# Patient Record
Sex: Male | Born: 1950 | Race: White | Hispanic: No | Marital: Married | Smoking: Never smoker
Health system: Southern US, Community
[De-identification: ages and names within clinical notes are randomized; demographics above are authoritative.]

## PROBLEM LIST (undated history)

## (undated) DIAGNOSIS — M1990 Unspecified osteoarthritis, unspecified site: Secondary | ICD-10-CM

## (undated) DIAGNOSIS — I480 Paroxysmal atrial fibrillation: Secondary | ICD-10-CM

## (undated) DIAGNOSIS — I2510 Atherosclerotic heart disease of native coronary artery without angina pectoris: Secondary | ICD-10-CM

## (undated) DIAGNOSIS — E785 Hyperlipidemia, unspecified: Secondary | ICD-10-CM

## (undated) DIAGNOSIS — E669 Obesity, unspecified: Secondary | ICD-10-CM

## (undated) DIAGNOSIS — E119 Type 2 diabetes mellitus without complications: Secondary | ICD-10-CM

## (undated) DIAGNOSIS — G4733 Obstructive sleep apnea (adult) (pediatric): Secondary | ICD-10-CM

## (undated) DIAGNOSIS — Z8249 Family history of ischemic heart disease and other diseases of the circulatory system: Secondary | ICD-10-CM

## (undated) DIAGNOSIS — I10 Essential (primary) hypertension: Secondary | ICD-10-CM

## (undated) HISTORY — DX: Atherosclerotic heart disease of native coronary artery without angina pectoris: I25.10

## (undated) HISTORY — DX: Family history of ischemic heart disease and other diseases of the circulatory system: Z82.49

## (undated) HISTORY — DX: Obstructive sleep apnea (adult) (pediatric): G47.33

## (undated) HISTORY — DX: Unspecified osteoarthritis, unspecified site: M19.90

## (undated) HISTORY — DX: Obesity, unspecified: E66.9

## (undated) HISTORY — DX: Type 2 diabetes mellitus without complications: E11.9

## (undated) HISTORY — PX: APPENDECTOMY: SHX54

## (undated) HISTORY — DX: Hyperlipidemia, unspecified: E78.5

## (undated) HISTORY — DX: Essential (primary) hypertension: I10

## (undated) HISTORY — DX: Paroxysmal atrial fibrillation: I48.0

---

## 2014-09-20 ENCOUNTER — Encounter: Payer: Self-pay | Admitting: Cardiovascular Disease

## 2014-09-20 ENCOUNTER — Ambulatory Visit (INDEPENDENT_AMBULATORY_CARE_PROVIDER_SITE_OTHER): Payer: 59 | Admitting: Cardiovascular Disease

## 2014-09-20 ENCOUNTER — Encounter (INDEPENDENT_AMBULATORY_CARE_PROVIDER_SITE_OTHER): Payer: 59

## 2014-09-20 VITALS — BP 146/82 | HR 63 | Ht 71.0 in | Wt 272.0 lb

## 2014-09-20 DIAGNOSIS — R079 Chest pain, unspecified: Secondary | ICD-10-CM | POA: Diagnosis not present

## 2014-09-20 DIAGNOSIS — R002 Palpitations: Secondary | ICD-10-CM | POA: Diagnosis not present

## 2014-09-20 DIAGNOSIS — E785 Hyperlipidemia, unspecified: Secondary | ICD-10-CM | POA: Diagnosis not present

## 2014-09-20 DIAGNOSIS — I10 Essential (primary) hypertension: Secondary | ICD-10-CM | POA: Diagnosis not present

## 2014-09-20 DIAGNOSIS — G4733 Obstructive sleep apnea (adult) (pediatric): Secondary | ICD-10-CM | POA: Diagnosis not present

## 2014-09-20 DIAGNOSIS — E119 Type 2 diabetes mellitus without complications: Secondary | ICD-10-CM | POA: Insufficient documentation

## 2014-09-20 NOTE — Assessment & Plan Note (Signed)
istory of hypertension blood pressure measured at 146/82. He is on carvedilol, lisinopril and hydrochlorothiazide. Continue current meds at current dosing

## 2014-09-20 NOTE — Progress Notes (Signed)
09/20/2014 Dennis King   [birth year]  [id number]  Primary Physician FREED, Helyn AppANIEL M, MD Primary Cardiologist: Runell GessJonathan J. Thanos Cousineau MD Roseanne RenoFACP,FACC,FAHA, FSCAI   HPI:  Dennis King is a 64 year old married Caucasian male father of 2 children who is a retired International aid/development workerveterinarian. His primary care physician is Dr. Marinda Elkobert Fried in [place]. He was referred for evaluation of palpitations and chest pain. His cardiac risk factor profile is notable for treated hypertension, diabetes and hyperlipidemia. He does have a family history of heart disease with a father who had his first MI at age 63 and died 10 years later from this. His mother had atrial fibrillation. He does have obstructive sleep apnea C Pap. He noted palpitations last summer which has been occurring on a monthly basis is associated with chest pressure and relative hypotension. He does drink 4 glasses of wine at night and several glasses of whiskey a week.   Current Outpatient Prescriptions  Medication Sig Dispense Refill  . atorvastatin (LIPITOR) 20 MG tablet Take 20 mg by mouth daily at 6 PM.     . carvedilol (COREG) 12.5 MG tablet Take 12.5 mg by mouth 2 (two) times daily with a meal.     . HUMALOG MIX 75/25 (75-25) 100 UNIT/ML SUSP injection Inject 15-40 Units into the skin 2 (two) times daily with a meal.     . JARDIANCE 10 MG TABS tablet 10 mg daily.     Marland Kitchen. lisinopril-hydrochlorothiazide (PRINZIDE,ZESTORETIC) 20-25 MG per tablet Take 1 tablet by mouth daily.     . metFORMIN (GLUCOPHAGE) 1000 MG tablet Take 2,000 mg by mouth daily with breakfast.     . NON FORMULARY Cpap 8cm H2O     No current facility-administered medications for this visit.    No Known Allergies  History   Social History  . Marital Status: Married    Spouse Name: N/A  . Number of Children: N/A  . Years of Education: N/A   Occupational History  . Not on file.   Social History Main Topics  . Smoking status: Never Smoker   . Smokeless tobacco: Not on file    . Alcohol Use: Not on file  . Drug Use: Not on file  . Sexual Activity: Not on file   Other Topics Concern  . Not on file   Social History Narrative  . No narrative on file     Review of Systems: General: negative for chills, fever, night sweats or weight changes.  Cardiovascular: negative for chest pain, dyspnea on exertion, edema, orthopnea, palpitations, paroxysmal nocturnal dyspnea or shortness of breath Dermatological: negative for rash Respiratory: negative for cough or wheezing Urologic: negative for hematuria Abdominal: negative for nausea, vomiting, diarrhea, bright red blood per rectum, melena, or hematemesis Neurologic: negative for visual changes, syncope, or dizziness All other systems reviewed and are otherwise negative except as noted above.    Blood pressure 146/82, pulse 63, height 5\' 11"  (1.803 m), weight 272 lb (123.378 kg).  General appearance: alert and no distress Neck: no adenopathy, no carotid bruit, no JVD, supple, symmetrical, trachea midline and thyroid not enlarged, symmetric, no tenderness/mass/nodules Lungs: clear to auscultation bilaterally Heart: regular rate and rhythm, S1, S2 normal, no murmur, click, rub or gallop Extremities: extremities normal, atraumatic, no cyanosis or edema and 2+ pedal pulses bilaterally  EKG normal sinus rhythm at 63 without ST or T-wave changes. I personally reviewed this EKG  ASSESSMENT AND PLAN:   Palpitations Patient complains of palpitations that occurred since  last summer several times a month with heart rates up to 150 associated with chest pressure and hypotension. I suspect this is PAF. If so he would need to be on oral anticoagulant. I'm going to get an event monitor to document this.   Obstructive sleep apnea On C Pap which he benefits from   Hyperlipidemia History of hyperlipidemia on atorvastatin 20 mg a day followed by his PCP   Essential hypertension istory of hypertension blood pressure  measured at 146/82. He is on carvedilol, lisinopril and hydrochlorothiazide. Continue current meds at current dosing       Runell Gess MD [facility], [facility], [place] 09/20/2014 5:12 PM

## 2014-09-20 NOTE — Assessment & Plan Note (Signed)
Patient complains of palpitations that occurred since last summer several times a month with heart rates up to 150 associated with chest pressure and hypotension. I suspect this is PAF. If so he would need to be on oral anticoagulant. I'm going to get an event monitor to document this.

## 2014-09-20 NOTE — Patient Instructions (Signed)
We will see you back in follow up after the tests.  Dr Allyson SabalBerry has ordered: 1.  Echocardiogram. Echocardiography is a painless test that uses sound waves to create images of your heart. It provides your doctor with information about the size and shape of your heart and how well your heart's chambers and valves are working. This procedure takes approximately one hour. There are no restrictions for this procedure.   2. Exercise Myoview- this is a test that looks at the blood flow to your heart muscle.  It takes approximately 2 1/2 hours. Please follow instruction sheet, as given.  3. Your Doctor has ordered you to wear a heart monitor. You will wear this for 30  days.   TIPS -  REMINDERS 1. The sensor is the lanyard that is worn around your neck every day - this is powered by a battery that needs to be changed every day 2. The monitor is the device that allows you to record symptoms - this will need to be charged daily 3. The sensor & monitor need to be within 100 feet of each other at all times 4. The sensor connects to the electrodes (stickers) - these should be changed every 24-48 hours (you do not have to remove them when you bathe, just make sure they are dry when you connect it back to the sensor 5. If you need more supplies (electrodes, batteries), please call the 1-800 # on the back of the pamphlet and [employer] will mail you more supplies 6. If your skin becomes sensitive, please try the sample pack of sensitive skin electrodes (the white packet in your silver box) and call [employer] to have them mail you more of these type of electrodes 7. When you are finish wearing the monitor, please place all supplies back in the silver box, place the silver box in the pre-packaged UPS bag and drop off at [employer] or call them so they can come pick it up   Cardiac Event Monitoring A cardiac event monitor is a small recording device used to help detect abnormal heart rhythms (arrhythmias). The monitor is  used to record heart rhythm when noticeable symptoms such as the following occur:  Fast heartbeats (palpitations), such as heart racing or fluttering.  Dizziness.  Fainting or light-headedness.  Unexplained weakness. The monitor is wired to two electrodes placed on your chest. Electrodes are flat, sticky disks that attach to your skin. The monitor can be worn for up to 30 days. You will wear the monitor at all times, except when bathing.  HOW TO USE YOUR CARDIAC EVENT MONITOR A technician will prepare your chest for the electrode placement. The technician will show you how to place the electrodes, how to work the monitor, and how to replace the batteries. Take time to practice using the monitor before you leave the office. Make sure you understand how to send the information from the monitor to your health care provider. This requires a telephone with a landline, not a cell phone. You need to:  Wear your monitor at all times, except when you are in water:  Do not get the monitor wet.  Take the monitor off when bathing. Do not swim or use a hot tub with it on.  Keep your skin clean. Do not put body lotion or moisturizer on your chest.  Change the electrodes daily or any time they stop sticking to your skin. You might need to use tape to keep them on.  It is possible  that your skin under the electrodes could become irritated. To keep this from happening, try to put the electrodes in slightly different places on your chest. However, they must remain in the area under your left breast and in the upper right section of your chest.  Make sure the monitor is safely clipped to your clothing or in a location close to your body that your health care provider recommends.  Press the button to record when you feel symptoms of heart trouble, such as dizziness, weakness, light-headedness, palpitations, thumping, shortness of breath, unexplained weakness, or a fluttering or racing heart. The monitor is  always on and records what happened slightly before you pressed the button, so do not worry about being too late to get good information.  Keep a diary of your activities, such as walking, doing chores, and taking medicine. It is especially important to note what you were doing when you pushed the button to record your symptoms. This will help your health care provider determine what might be contributing to your symptoms. The information stored in your monitor will be reviewed by your health care provider alongside your diary entries.  Send the recorded information as recommended by your health care provider. It is important to understand that it will take some time for your health care provider to process the results.  Change the batteries as recommended by your health care provider. SEEK IMMEDIATE MEDICAL CARE IF:   You have chest pain.  You have extreme difficulty breathing or shortness of breath.  You develop a very fast heartbeat that persists.  You develop dizziness that does not go away.  You faint or constantly feel you are about to faint. Document Released: 02/17/2008 Document Revised: 09/24/2013 Document Reviewed: 11/06/2012 [employer] Patient Information 2015 [place], [place]. This information is not intended to replace advice given to you by your health care provider. Make sure you discuss any questions you have with your health care provider.

## 2014-09-20 NOTE — Assessment & Plan Note (Signed)
History of hyperlipidemia on atorvastatin 20 mg a day followed by his PCP 

## 2014-09-20 NOTE — Assessment & Plan Note (Signed)
On C Pap which he benefits from 

## 2014-09-23 ENCOUNTER — Telehealth: Payer: Self-pay | Admitting: Cardiovascular Disease

## 2014-09-23 NOTE — Telephone Encounter (Signed)
[employer] called - reported autotrigger event which met high heart rate criteria.  HR 185 - A Fib on monitor.  No symptoms reported by patient.   Fax number confirmed, awaiting strips. 

## 2014-09-23 NOTE — Telephone Encounter (Signed)
[employer] reporting:  A fib - 175 bpm autotrigger. No symptoms recorded.

## 2014-09-23 NOTE — Telephone Encounter (Signed)
She has an abnormal EKG to report.

## 2014-09-23 NOTE — Telephone Encounter (Signed)
Discussed w/ Dr. Tresa EndoKelly. Plan is to increase carvedilol to 25mg  BID, start patient on oral anticoagulant - Xarelto or Eliquis - Dr. Tresa EndoKelly [facility]  Will route to [place] to advise.

## 2014-09-23 NOTE — Telephone Encounter (Signed)
[employer] called. Pt still in A fib - 168 HR last checked ~10:30am. No symptoms reported.  Advised awaiting first fax, verified fax number.   Called patient to follow up, he states no problems, having no symptoms.   Pt reports he took VS this AM - BP 123/82. HR was 73 then. 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   Informed patient I would alert [employer] for [employer] report, call pt back if any recommendations. Pt amenable to plan.

## 2014-09-24 ENCOUNTER — Telehealth: Payer: Self-pay | Admitting: Cardiovascular Disease

## 2014-09-24 DIAGNOSIS — Z79899 Other long term (current) drug therapy: Secondary | ICD-10-CM

## 2014-09-24 MED ORDER — APIXABAN 5 MG PO TABS
5.0000 mg | ORAL_TABLET | Freq: Two times a day (BID) | ORAL | Status: DC
Start: 2014-09-24 — End: 2014-10-23

## 2014-09-24 NOTE — Telephone Encounter (Signed)
Dr Jens Somrenshaw reviewed the report.  Voice order received from Dr Jens Somrenshaw: d/c ASA, start apixaban 5mg  bid, cbc and bmp in 4 weeks.   I spoke with patient and he verbalized understanding of instructions.  He will d/c ASA, start apixaban (ERX sent) and have labs drawn in 1 month (lap slip mailed).   I contact PCP and requested previous labs.

## 2014-09-24 NOTE — Telephone Encounter (Signed)
Spoke to CG ([employer] REP.) AUTO TRIGGER EVENT AT 5:30 AM TODAY  RAPID AFIB. RATE 168 REPORT BEING FAXED FOR REVIEW

## 2014-09-25 ENCOUNTER — Telehealth: Payer: Self-pay | Admitting: *Deleted

## 2014-09-25 NOTE — Telephone Encounter (Signed)
Siji, [employer] rep reporting autotrigger HR of 183 - showing A fib. Confirmed fax number.  Pt history of palpitations x1 yr. Recently seen by Dr. Allyson SabalBerry to establish care. Was put on 30-day event monitor. Pt has had several episodes of A Fib w RVR since having monitor placed - see recent notes  per Kathryn's note yesterday, pt started on Eliquis w/ instructions for f/u labwork.  Will route to [employer] for any additional advice/instruction.

## 2014-09-25 NOTE — Telephone Encounter (Signed)
Pt.s most recent  Rhythm strip reviewed by Dr. [place]az[place] no new orders

## 2014-09-25 NOTE — Telephone Encounter (Signed)
Siji has an urgent EKG to report for the pt. Please f/u with her  Thanks

## 2014-09-25 NOTE — Telephone Encounter (Signed)
Spoke with patient, he was already prescribed Eliquis 5 mg bid.  Reviewed medication information with him.  He is still waiting to see if will be covered by his insurance.  Will mail a copay card to him, as he is commercial not medicare.  Pt knows to call if problems obtaining med.  Pt takes aleve 2 tabs bid.  Was told that this would interact.  Uses for arthritis and back pain.  Asked that he decrease dose to 1 tab twice daily and be sure to take with food.  Gave information about GI bleed, pt voiced understanding and knowledge about this interaction.

## 2014-09-25 NOTE — Telephone Encounter (Signed)
[facility]  Cr 1.26, CrCl  64.7 using IBW.  Will start patient on Eliquis 5 mg bid.

## 2014-09-25 NOTE — Telephone Encounter (Signed)
Autotrigger alert - HR 177. No symptoms reported by patient.

## 2014-09-25 NOTE — Telephone Encounter (Signed)
Should take extra carvedilol when HR elevated and feeling palpitations.  >Dr H

## 2014-09-25 NOTE — Telephone Encounter (Signed)
Spoke w/ patient - per Dr. Blanchie DessertHilty's advice, suggested pt take extra dose of carvedilol for symptoms. Requested if he can keep track of baseline HR to do so. He reports when at rest, HR is 70-mid 80s.  Pt voiced understanding of instructions given.

## 2014-09-25 NOTE — Telephone Encounter (Signed)
[employer] has called for an auto trigger event of Atrial Fibb with a rate of 177 bpm, I will show this to Dr. Rennis GoldenHilty as it comes in on hard copy for his review

## 2014-09-27 ENCOUNTER — Encounter: Payer: Self-pay | Admitting: Cardiology

## 2014-10-08 ENCOUNTER — Telehealth (HOSPITAL_COMMUNITY): Payer: Self-pay | Admitting: *Deleted

## 2014-10-08 NOTE — Telephone Encounter (Signed)
Patient given detailed instructions per Myocardial Perfusion Study Information Sheet for test on 10/10/14 at 7:15 Patient verbalized understanding. Antionette CharMary J Myrla Malanowski, RN

## 2014-10-09 ENCOUNTER — Telehealth: Payer: Self-pay | Admitting: Cardiovascular Disease

## 2014-10-09 NOTE — Telephone Encounter (Signed)
Dr Allyson SabalBerry signed off on the report. No med changes at this time.

## 2014-10-09 NOTE — Telephone Encounter (Signed)
[facility] alert  A Fib, High HR alert. Lasted ~2 mins. Peak 177bpm, autotrigger, no symptoms recorded.   They will send strip. Verified fax number for office.

## 2014-10-10 ENCOUNTER — Ambulatory Visit (HOSPITAL_COMMUNITY)
Admission: RE | Admit: 2014-10-10 | Discharge: 2014-10-10 | Disposition: A | Discharge status: Home / Self Care | Payer: 59 | Source: Ambulatory Visit | Attending: Cardiovascular Disease | Admitting: Cardiovascular Disease

## 2014-10-10 ENCOUNTER — Ambulatory Visit (HOSPITAL_BASED_OUTPATIENT_CLINIC_OR_DEPARTMENT_OTHER)
Admission: RE | Admit: 2014-10-10 | Discharge: 2014-10-10 | Disposition: A | Discharge status: Home / Self Care | Payer: 59 | Source: Ambulatory Visit | Attending: Cardiovascular Disease | Admitting: Cardiovascular Disease

## 2014-10-10 DIAGNOSIS — E785 Hyperlipidemia, unspecified: Secondary | ICD-10-CM | POA: Insufficient documentation

## 2014-10-10 DIAGNOSIS — G4733 Obstructive sleep apnea (adult) (pediatric): Secondary | ICD-10-CM | POA: Diagnosis not present

## 2014-10-10 DIAGNOSIS — I10 Essential (primary) hypertension: Secondary | ICD-10-CM | POA: Diagnosis not present

## 2014-10-10 DIAGNOSIS — E119 Type 2 diabetes mellitus without complications: Secondary | ICD-10-CM | POA: Insufficient documentation

## 2014-10-10 DIAGNOSIS — R079 Chest pain, unspecified: Secondary | ICD-10-CM

## 2014-10-10 DIAGNOSIS — Z8249 Family history of ischemic heart disease and other diseases of the circulatory system: Secondary | ICD-10-CM | POA: Diagnosis not present

## 2014-10-10 DIAGNOSIS — R002 Palpitations: Secondary | ICD-10-CM | POA: Insufficient documentation

## 2014-10-10 LAB — MYOCARDIAL PERFUSION IMAGING
CHL CUP NUCLEAR SDS: 3
CHL CUP NUCLEAR SSS: 3
CHL CUP STRESS STAGE 1 DBP: 79 mmHg
CHL CUP STRESS STAGE 1 GRADE: 0 %
CHL CUP STRESS STAGE 1 SPEED: 0 mph
CHL CUP STRESS STAGE 2 GRADE: 0 %
CHL CUP STRESS STAGE 2 HR: 137 {beats}/min
CHL CUP STRESS STAGE 2 SPEED: 1 mph
CHL CUP STRESS STAGE 4 HR: 184 {beats}/min
CHL CUP STRESS STAGE 4 SPEED: 1.7 mph
CHL CUP STRESS STAGE 5 HR: 214 {beats}/min
CHL CUP STRESS STAGE 5 SBP: 141 mmHg
CHL CUP STRESS STAGE 6 GRADE: 12.1 %
CHL CUP STRESS STAGE 6 HR: 210 {beats}/min
CHL CUP STRESS STAGE 7 DBP: 64 mmHg
CHL CUP STRESS STAGE 8 HR: 133 {beats}/min
CHL CUP STRESS STAGE 8 SPEED: 0 mph
CSEPEDS: 0 s
CSEPPHR: 210 {beats}/min
CSEPPMHR: 132 %
Estimated workload: 7 METS
Exercise duration (min): 6 min
LV dias vol: 74 mL
LVSYSVOL: 49 mL
MPHR: 158 {beats}/min
Nuc Stress EF: 34 %
Percent HR: 140 %
RPE: 30174
Rest HR: 101 {beats}/min
SRS: 0
Stage 1 HR: 139 {beats}/min
Stage 1 SBP: 102 mmHg
Stage 3 Grade: 0 %
Stage 3 HR: 139 {beats}/min
Stage 3 Speed: 1 mph
Stage 4 DBP: 62 mmHg
Stage 4 Grade: 10 %
Stage 4 SBP: 133 mmHg
Stage 5 DBP: 64 mmHg
Stage 5 Grade: 12 %
Stage 5 Speed: 2.5 mph
Stage 6 Speed: 2.5 mph
Stage 7 Grade: 0 %
Stage 7 HR: 184 {beats}/min
Stage 7 SBP: 125 mmHg
Stage 7 Speed: 0 mph
Stage 8 DBP: 68 mmHg
Stage 8 Grade: 0 %
Stage 8 SBP: 110 mmHg
TID: 0.74

## 2014-10-10 MED ORDER — TECHNETIUM TC 99M SESTAMIBI GENERIC - CARDIOLITE
30.5000 | Freq: Once | INTRAVENOUS | Status: AC | PRN
  Administered 2014-10-10: 31 via INTRAVENOUS

## 2014-10-10 MED ORDER — TECHNETIUM TC 99M SESTAMIBI GENERIC - CARDIOLITE
10.9000 | Freq: Once | INTRAVENOUS | Status: AC | PRN
  Administered 2014-10-10: 10.9 via INTRAVENOUS

## 2014-10-10 NOTE — Progress Notes (Signed)
Mr. Dennis King was very tachycardic during the echo procedure, making it difficult to obtain images for evaluation of systolic and diastolic function.  Parasternal short axis Images are adequate for EF evaluation, although it is not accurate due to the heart rate and rhythm.  Mr. Dennis King decided against use of definity contrast at this time, until his heart rate is more under control, and an accurate measurement of EF could be evaluated.  He is happy to return back for a limited evaluation of EF with contrast when his rhythm is controlled.  Farrel ConnersBethany Macel Yearsley, RDCS

## 2014-10-11 ENCOUNTER — Telehealth: Payer: Self-pay | Admitting: Cardiovascular Disease

## 2014-10-11 NOTE — Telephone Encounter (Signed)
Closed encounter °

## 2014-10-14 ENCOUNTER — Telehealth: Payer: Self-pay | Admitting: Cardiovascular Disease

## 2014-10-14 NOTE — Telephone Encounter (Signed)
Pt called in wanting to know if there was anything significant that was found on his stress test that he should be aware of. He had to r/s his appt with Dr. Allyson SabalBerry for July because he was going out of town tomorrow. Please f/u with pt  Thanks

## 2014-10-14 NOTE — Telephone Encounter (Signed)
Left message on machine for patient to return call

## 2014-10-15 ENCOUNTER — Ambulatory Visit: Payer: Self-pay | Admitting: Cardiovascular Disease

## 2014-10-15 NOTE — Telephone Encounter (Signed)
Spoke to patient - advised Dr. Allyson SabalBerry would be able to discuss in further detail results of stress test - pt wanted to make me aware stress test was not stopped early on his account, but because nurse was concerned about his heart rate elevation. States he did not feel fatigued during test, but was aware his HR was fast.  We also discussed his continuing to wear the 30 day event monitor. No further questions from patient.

## 2014-10-16 ENCOUNTER — Ambulatory Visit: Payer: Self-pay | Admitting: Cardiovascular Disease

## 2014-10-20 ENCOUNTER — Encounter (HOSPITAL_COMMUNITY): Payer: Self-pay | Admitting: *Deleted

## 2014-10-20 ENCOUNTER — Inpatient Hospital Stay (HOSPITAL_COMMUNITY)
Admission: AD | Admit: 2014-10-20 | Discharge: 2014-10-23 | DRG: 251 | Disposition: A | Discharge status: Home / Self Care | Payer: 59 | Source: Ambulatory Visit | Attending: Cardiology | Admitting: Cardiology

## 2014-10-20 DIAGNOSIS — I214 Non-ST elevation (NSTEMI) myocardial infarction: Secondary | ICD-10-CM | POA: Diagnosis not present

## 2014-10-20 DIAGNOSIS — E669 Obesity, unspecified: Secondary | ICD-10-CM | POA: Diagnosis present

## 2014-10-20 DIAGNOSIS — I2510 Atherosclerotic heart disease of native coronary artery without angina pectoris: Secondary | ICD-10-CM | POA: Diagnosis not present

## 2014-10-20 DIAGNOSIS — Z79899 Other long term (current) drug therapy: Secondary | ICD-10-CM

## 2014-10-20 DIAGNOSIS — I480 Paroxysmal atrial fibrillation: Secondary | ICD-10-CM | POA: Diagnosis not present

## 2014-10-20 DIAGNOSIS — I420 Dilated cardiomyopathy: Secondary | ICD-10-CM | POA: Diagnosis present

## 2014-10-20 DIAGNOSIS — E876 Hypokalemia: Secondary | ICD-10-CM | POA: Diagnosis not present

## 2014-10-20 DIAGNOSIS — Z6835 Body mass index (BMI) 35.0-35.9, adult: Secondary | ICD-10-CM | POA: Diagnosis not present

## 2014-10-20 DIAGNOSIS — G4733 Obstructive sleep apnea (adult) (pediatric): Secondary | ICD-10-CM | POA: Diagnosis present

## 2014-10-20 DIAGNOSIS — I4891 Unspecified atrial fibrillation: Secondary | ICD-10-CM

## 2014-10-20 DIAGNOSIS — R7989 Other specified abnormal findings of blood chemistry: Secondary | ICD-10-CM | POA: Diagnosis not present

## 2014-10-20 DIAGNOSIS — E785 Hyperlipidemia, unspecified: Secondary | ICD-10-CM | POA: Diagnosis present

## 2014-10-20 DIAGNOSIS — N189 Chronic kidney disease, unspecified: Secondary | ICD-10-CM | POA: Diagnosis present

## 2014-10-20 DIAGNOSIS — I129 Hypertensive chronic kidney disease with stage 1 through stage 4 chronic kidney disease, or unspecified chronic kidney disease: Secondary | ICD-10-CM | POA: Diagnosis present

## 2014-10-20 DIAGNOSIS — R079 Chest pain, unspecified: Secondary | ICD-10-CM

## 2014-10-20 DIAGNOSIS — Z7901 Long term (current) use of anticoagulants: Secondary | ICD-10-CM | POA: Diagnosis not present

## 2014-10-20 DIAGNOSIS — Z8249 Family history of ischemic heart disease and other diseases of the circulatory system: Secondary | ICD-10-CM | POA: Diagnosis not present

## 2014-10-20 DIAGNOSIS — I10 Essential (primary) hypertension: Secondary | ICD-10-CM | POA: Diagnosis present

## 2014-10-20 DIAGNOSIS — Z9861 Coronary angioplasty status: Secondary | ICD-10-CM

## 2014-10-20 DIAGNOSIS — R778 Other specified abnormalities of plasma proteins: Secondary | ICD-10-CM | POA: Insufficient documentation

## 2014-10-20 DIAGNOSIS — E119 Type 2 diabetes mellitus without complications: Secondary | ICD-10-CM | POA: Diagnosis not present

## 2014-10-20 DIAGNOSIS — Z794 Long term (current) use of insulin: Secondary | ICD-10-CM | POA: Diagnosis not present

## 2014-10-20 LAB — GLUCOSE, CAPILLARY
GLUCOSE-CAPILLARY: 119 mg/dL — AB (ref 65–99)
Glucose-Capillary: 185 mg/dL — ABNORMAL HIGH (ref 65–99)
Glucose-Capillary: 126 mg/dL — ABNORMAL HIGH (ref 65–99)
Glucose-Capillary: 177 mg/dL — ABNORMAL HIGH (ref 65–99)

## 2014-10-20 LAB — TROPONIN I
TROPONIN I: 0.19 ng/mL — AB (ref <0.031)
TROPONIN I: 0.35 ng/mL — AB (ref <0.031)
Troponin I: 0.61 ng/mL (ref <0.031)

## 2014-10-20 LAB — MAGNESIUM: MAGNESIUM: 2.2 mg/dL (ref 1.7–2.4)

## 2014-10-20 LAB — TSH: TSH: 2.763 u[IU]/mL (ref 0.350–4.500)

## 2014-10-20 MED ORDER — SODIUM CHLORIDE 0.9 % IJ SOLN: 3.0000 mL | INTRAMUSCULAR | Status: DC | PRN

## 2014-10-20 MED ORDER — OFF THE BEAT BOOK
Freq: Once | Status: AC
  Administered 2014-10-20
  Filled 2014-10-20: qty 1

## 2014-10-20 MED ORDER — LISINOPRIL 10 MG PO TABS
20.0000 mg | ORAL_TABLET | Freq: Every day | ORAL | Status: DC
  Administered 2014-10-20 – 2014-10-23 (×4): 20 mg via ORAL
  Filled 2014-10-20: qty 1
  Filled 2014-10-20: qty 2
  Filled 2014-10-20 (×2): qty 1

## 2014-10-20 MED ORDER — EMPAGLIFLOZIN 10 MG PO TABS
10.0000 mg | ORAL_TABLET | Freq: Every day | ORAL | Status: DC
  Administered 2014-10-21: 10 mg via ORAL

## 2014-10-20 MED ORDER — HEPARIN BOLUS VIA INFUSION
4000.0000 [IU] | Freq: Once | INTRAVENOUS | Status: AC
  Administered 2014-10-20: 4000 [IU] via INTRAVENOUS
  Filled 2014-10-20: qty 4000

## 2014-10-20 MED ORDER — ASPIRIN EC 81 MG PO TBEC
81.0000 mg | DELAYED_RELEASE_TABLET | Freq: Every day | ORAL | Status: DC
  Administered 2014-10-20 – 2014-10-21 (×2): 81 mg via ORAL
  Filled 2014-10-20 (×2): qty 1

## 2014-10-20 MED ORDER — INSULIN ASPART PROT & ASPART (70-30 MIX) 100 UNIT/ML ~~LOC~~ SUSP
15.0000 [IU] | Freq: Two times a day (BID) | SUBCUTANEOUS | Status: DC
  Administered 2014-10-20 – 2014-10-23 (×6): 15 [IU] via SUBCUTANEOUS
  Filled 2014-10-20 (×2): qty 10

## 2014-10-20 MED ORDER — LISINOPRIL-HYDROCHLOROTHIAZIDE 20-25 MG PO TABS: 1.0000 | ORAL_TABLET | Freq: Every day | ORAL | Status: DC

## 2014-10-20 MED ORDER — FUROSEMIDE 10 MG/ML IJ SOLN
40.0000 mg | Freq: Once | INTRAMUSCULAR | Status: AC
  Administered 2014-10-21: 40 mg via INTRAVENOUS
  Filled 2014-10-20: qty 4

## 2014-10-20 MED ORDER — ONDANSETRON HCL 4 MG/2ML IJ SOLN: 4.0000 mg | Freq: Four times a day (QID) | INTRAMUSCULAR | Status: DC | PRN

## 2014-10-20 MED ORDER — SALINE SPRAY 0.65 % NA SOLN
1.0000 | NASAL | Status: DC | PRN
  Administered 2014-10-20: 1 via NASAL
  Filled 2014-10-20: qty 44

## 2014-10-20 MED ORDER — CARVEDILOL 12.5 MG PO TABS: 12.5000 mg | ORAL_TABLET | Freq: Two times a day (BID) | ORAL | Status: DC

## 2014-10-20 MED ORDER — HYDROCHLOROTHIAZIDE 25 MG PO TABS
25.0000 mg | ORAL_TABLET | Freq: Every day | ORAL | Status: DC
  Administered 2014-10-20: 25 mg via ORAL
  Filled 2014-10-20 (×2): qty 1

## 2014-10-20 MED ORDER — TICAGRELOR 90 MG PO TABS: 90.0000 mg | ORAL_TABLET | Freq: Two times a day (BID) | ORAL | Status: DC

## 2014-10-20 MED ORDER — SODIUM CHLORIDE 0.9 % IV SOLN: 250.0000 mL | INTRAVENOUS | Status: DC | PRN

## 2014-10-20 MED ORDER — SODIUM CHLORIDE 0.9 % IJ SOLN
3.0000 mL | Freq: Two times a day (BID) | INTRAMUSCULAR | Status: DC
  Administered 2014-10-20: 3 mL via INTRAVENOUS

## 2014-10-20 MED ORDER — TICAGRELOR 90 MG PO TABS
180.0000 mg | ORAL_TABLET | Freq: Once | ORAL | Status: AC
  Administered 2014-10-20: 180 mg via ORAL
  Filled 2014-10-20: qty 2

## 2014-10-20 MED ORDER — ACETAMINOPHEN 325 MG PO TABS: 650.0000 mg | ORAL_TABLET | ORAL | Status: DC | PRN

## 2014-10-20 MED ORDER — INSULIN ASPART 100 UNIT/ML ~~LOC~~ SOLN
0.0000 [IU] | Freq: Three times a day (TID) | SUBCUTANEOUS | Status: DC
  Administered 2014-10-20 (×2): 2 [IU] via SUBCUTANEOUS
  Administered 2014-10-20 – 2014-10-21 (×3): 3 [IU] via SUBCUTANEOUS
  Administered 2014-10-21: 2 [IU] via SUBCUTANEOUS
  Administered 2014-10-22 (×2): 3 [IU] via SUBCUTANEOUS
  Administered 2014-10-22: 2 [IU] via SUBCUTANEOUS
  Administered 2014-10-23: 3 [IU] via SUBCUTANEOUS

## 2014-10-20 MED ORDER — CARVEDILOL 12.5 MG PO TABS
12.5000 mg | ORAL_TABLET | Freq: Every day | ORAL | Status: DC
  Administered 2014-10-20 – 2014-10-22 (×3): 12.5 mg via ORAL
  Filled 2014-10-20 (×3): qty 1

## 2014-10-20 MED ORDER — ROSUVASTATIN CALCIUM 20 MG PO TABS
20.0000 mg | ORAL_TABLET | Freq: Every day | ORAL | Status: DC
  Administered 2014-10-20 – 2014-10-22 (×3): 20 mg via ORAL
  Filled 2014-10-20: qty 2
  Filled 2014-10-20: qty 1
  Filled 2014-10-20 (×2): qty 2

## 2014-10-20 MED ORDER — CARVEDILOL 12.5 MG PO TABS
25.0000 mg | ORAL_TABLET | Freq: Every day | ORAL | Status: DC
  Administered 2014-10-20 – 2014-10-23 (×4): 25 mg via ORAL
  Filled 2014-10-20 (×2): qty 1
  Filled 2014-10-20 (×2): qty 2

## 2014-10-20 MED ORDER — ATORVASTATIN CALCIUM 20 MG PO TABS
20.0000 mg | ORAL_TABLET | Freq: Every day | ORAL | Status: DC
  Filled 2014-10-20 (×2): qty 1

## 2014-10-20 MED ORDER — METFORMIN HCL 500 MG PO TABS: 2000.0000 mg | ORAL_TABLET | Freq: Every day | ORAL | Status: DC

## 2014-10-20 MED ORDER — HEPARIN (PORCINE) IN NACL 100-0.45 UNIT/ML-% IJ SOLN
1450.0000 [IU]/h | INTRAMUSCULAR | Status: DC
  Administered 2014-10-20 – 2014-10-21 (×3): 1450 [IU]/h via INTRAVENOUS
  Filled 2014-10-20 (×3): qty 250

## 2014-10-20 NOTE — Progress Notes (Signed)
ANTICOAGULATION CONSULT NOTE - Initial Consult  Pharmacy Consult for Heparin  Indication: chest pain/ACS / Afib   Allergies  Allergen Reactions  . Statins Other (See Comments)    myalgia  . Tape Rash    Paper tape please.    Patient Measurements: Height: 5\' 11"  (180.3 cm) Weight: 268 lb 1.3 oz (121.6 kg) IBW/kg (Calculated) : 75.3 Heparin Dosing Weight: 105kg   Vital Signs: Temp: 97.5 F (36.4 C) (05/29 1550) Temp Source: Oral (05/29 1550) BP: 116/76 mmHg (05/29 1550) Pulse Rate: 145 (05/29 1550)  Labs:  Recent Labs  10/20/14 0707 10/20/14 1147  TROPONINI 0.19* 0.35*    CrCl cannot be calculated (Patient has no serum creatinine result on file.).   Medical History: Past Medical History  Diagnosis Date  . Type 2 diabetes mellitus   . Hypertension   . Hyperlipidemia   . Family history of heart disease   . Obstructive sleep apnea     on C Pap  . Obesity   . Atrial fibrillation      Assessment: 64yo with new Dx Afib May 2016.  He was instructed by MD office to begin apixiban as outpt but did not. His myoview 10/10/14 did not show ischemia but global hypokinesis of LV with EF 30%.   Presented with CP and palpitation in Afib.  Currently CVR 90s with coreg.  His Troponin bumped slightly 0.19>0.35 and cath planned next week.    Plan to begin heparin for anticoagulation.   Goal of Therapy:  Heparin level 0.3-0.7 units/ml Monitor platelets by anticoagulation protocol: Yes   Plan:   Heparin bolus 4000 uts IV x1 Heparin drip 1450 uts/hr  Check HL 6hr after drip started Daily HL, CBC Ticagrelor 180mg  x1 then 90mg  BID in setting of NSTEMI per MD Hold metformin in setting of NSTEMI and planned cath  Leota SauersLisa Skyla Champagne Pharm.D. CPP, BCPS Clinical Pharmacist [phone] 10/20/2014 5:11 PM

## 2014-10-20 NOTE — Progress Notes (Signed)
Stable this am without complaints

## 2014-10-20 NOTE — Plan of Care (Signed)
On call Note  Called by RN TnI results which is now 0.6 (it was 0.19 and then 0.35 earlier).  Patient seen. He denies angina or dyspnea at rest or mild activity around his hospital room.  Tele HR 100-120 bpm, AF BP stable and over 110 systolic.  Irregular RR, no murmur, JVP~11 cm Mild basal crackles Trace edema  Meds:  Heparin gtt, ASA 81 mg po qd, Coreg 25am+12.5pm, Crestor 20 mg qd, Lisinopril He received Brilinta 180 mg pox1 couple hours ago but [facility]  He had exercise MPS on 5/19 which was negative for ischemia but had low EF 34% with global hypokinesis   A:   1. 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 Will not restart Brilinta at this time unless angina develops  - Will give Lasix 40 mg IV x1 in am; I-O, daily weight - Plan for coronary angiography on Tuesday 5/31  Discussed with on call Interventional cardiology   Nevin BloodgoodAmir Vail Basista, MD Cardiology

## 2014-10-20 NOTE — H&P (Signed)
CARDIOLOGY ADMISSION NOTE  Patient ID: Dennis King MRN: [id number] DOB/AGE: 10-03-1950 64 y.o.  Admit date: 10/20/2014 Primary Physician   King, Dennis Cheadle, MD Primary Cardiologist   Dr. Allyson King Chief Complaint    Chest pain  HPI:  The patient presented to [facility] with chest pain and palpitations.  He had seen Dr. Allyson King recently with palpitations.  He was thought to probably have atrial fib.  He is wearing an event monitor.  This demonstrated atrial fib with multiple episodes of rapid rate.  He has been treated with increasing doses of Coreg.  He has been given a prescription for Eliquis but did not start this.  He takes Aleve for joint pain and didn't want to stop this.  He has been taking ASA.  An exercise Myoview demonstrated no ischemia.  However, the EF was low at 30-44%.  On echo the EF was 45-50% with diffuse hypokinesis.    The patient reports that he has had irregular heart beats for months that have been getting worse with increasing frequency and intensity.  He has had some last for 3 days.  With the rapid rates he gets chest pain.  This is mid sternal and 6/10 at the peak.  No radiation, no associated symptoms.  He had this today and it was not going away so he came to the ED.  He also recorded his own blood pressure and it was very elevated.  In the ED he was noted to be in fib and had a very mildly elevated troponin.  He is not complaining of chest pain currently.   Past Medical History  Diagnosis Date  . Type 2 diabetes mellitus   . Hypertension   . Hyperlipidemia   . Family history of heart disease   . Obstructive sleep apnea     on C Pap  . Obesity   . Atrial fibrillation     Past Surgical History  Procedure Laterality Date  . Appendectomy      No Known Allergies No current facility-administered medications on file prior to encounter.   Current Outpatient Prescriptions on File Prior to Encounter  Medication Sig Dispense Refill  . apixaban (ELIQUIS) 5  MG TABS tablet Take 1 tablet (5 mg total) by mouth 2 (two) times daily. 60 tablet 3  . atorvastatin (LIPITOR) 20 MG tablet Take 20 mg by mouth daily at 6 PM.     . carvedilol (COREG) 12.5 MG tablet Take 12.5 mg x 2 in am and x 1 in pm    . HUMALOG MIX 75/25 (75-25) 100 UNIT/ML SUSP injection Inject 15-40 Units into the skin 2 (two) times daily with a meal.     . JARDIANCE 10 MG TABS tablet 10 mg daily.     Marland Kitchen lisinopril-hydrochlorothiazide (PRINZIDE,ZESTORETIC) 20-25 MG per tablet Take 1 tablet by mouth daily.     . metFORMIN (GLUCOPHAGE) 1000 MG tablet Take 2,000 mg by mouth daily with breakfast.     . NON FORMULARY Cpap 8cm H2O     History   Social History  . Marital Status: Married    Spouse Name: N/A  . Number of Children: 2  . Years of Education: N/A   Occupational History  . Not on file.   Social History Main Topics  . Smoking status: Never Smoker   . Smokeless tobacco: Not on file  . Alcohol Use: 8.4 - 18.0 oz/week    0 Standard drinks or equivalent, 14-30 Glasses of wine per week  .  Drug Use: Not on file  . Sexual Activity: Not on file   Other Topics Concern  . Not on file   Social History Narrative   Retired Administrator, [employer].  Drinks 12 cups of coffee daily.      Family History  Problem Relation Age of Onset  . Heart attack Father 1255    Died age 63  . Atrial fibrillation Mother      ROS:  Chronic low grade diarrhea.  Otherwise as stated in the HPI and negative for all other systems.  Physical Exam: Blood pressure 119/64, pulse 98, temperature 98.2 F (36.8 C), temperature source Oral, resp. rate 18, height 5\' 11"  (1.803 m), weight 268 lb 1.3 oz (121.6 kg), SpO2 98 %.  GENERAL:  Well appearing HEENT:  Pupils equal round and reactive, fundi not visualized, oral mucosa unremarkable NECK:  No jugular venous distention, waveform within normal limits, carotid upstroke brisk and symmetric, no bruits, no thyromegaly LYMPHATICS:  No cervical, inguinal adenopathy LUNGS:  Clear to  auscultation bilaterally BACK:  No CVA tenderness CHEST:  Unremarkable HEART:  PMI not displaced or sustained,S1 and S2 within normal limits, no S3, n no clicks, no rubs, no murmurs, irregular ABD:  Flat, positive bowel sounds normal in frequency in pitch, no bruits, no rebound, no guarding, no midline pulsatile mass, no hepatomegaly, no splenomegaly EXT:  2 plus pulses throughout, no edema, no cyanosis no clubbing SKIN:  No rashes no nodules NEURO:  Cranial nerves II through XII grossly intact, motor grossly intact throughout PSYCH:  Cognitively intact, oriented to person place and time  Labs:  CREATININE: 0.95 NA, K, CL, CO2:  136, 3.6, 94, 26 TROPONINI: <0.01  (normal <=0.009) WBC, HGB, HCT, MCV, PLT: 8.2, 14.2, 42.1, 95, 238 ALT, AST, ALKPHOS, BILITOT:  46, 34, 46, 0.32    Radiology:  CXR:  Normal  EKG:  Atrial fib, rate 101, PVCs, intervals WNL, no acute ST T wave changes.   ASSESSMENT AND PLAN:    CHEST PAIN:  Given his risk factors, borderline troponin and reduced EF cath is indicated.  The patient understands that risks included but are not limited to stroke (1 in 1000), death (1 in 1000), kidney failure [usually temporary] (1 in 500), bleeding (1 in 200), allergic reaction [possibly serious] (1 in 200).  The patient understands and agrees to proceed.  I will place him on the board for Tuesday.  Please write orders on Monday to avoid confusion about diet status.   ATRIAL FIB:  He will need rhythm control. Might consider Tikosyn.  Defer decision until cath completed.  Mr. Ned ClinesRobert King has a CHA2DS2 - VASc score of 2 with a risk of stroke of 2.2%    DM:  I will start the lower dose of his insulin that he takes and cover sliding scale.   CARDIOMYOPATHY:  Work up as above. Could be related to rate, CAD or wine.    SLEEP APNEA:  Patient will continue CPAP.  (He can use his own.)   HTN:  Continue previous meds.  ALCOHOL:  He needs to discontinue or greatly reduce.    CAFFEINE:  Discussed the need to cut way back.  SignedRollene Rotunda: Skyah Hannon 10/20/2014, 6:12 AM

## 2014-10-20 NOTE — Progress Notes (Signed)
Updated Nada BoozerLaura Ingold NP for elevated troponin level at 0.35.  Pharmacy also aware.

## 2014-10-20 NOTE — Progress Notes (Signed)
Pt troponin 0.61, pt on IV heparin, for cath Tuesday.  MD on call text paged.  Awaiting return call.

## 2014-10-20 NOTE — Progress Notes (Signed)
Called by RN, troponin up to 0.35.  Pt never started the eliquis, he wanted to discuss with MD first.  Will begin IV heparin per pharmacy for elevated troponin and PAF.

## 2014-10-20 NOTE — Progress Notes (Signed)
Called for critical troponin level at 0.61. Reported to incoming RN

## 2014-10-21 DIAGNOSIS — R7989 Other specified abnormal findings of blood chemistry: Secondary | ICD-10-CM

## 2014-10-21 DIAGNOSIS — I420 Dilated cardiomyopathy: Secondary | ICD-10-CM

## 2014-10-21 LAB — BASIC METABOLIC PANEL
Anion gap: 8 (ref 5–15)
BUN: 19 mg/dL (ref 6–20)
CHLORIDE: 102 mmol/L (ref 101–111)
CO2: 24 mmol/L (ref 22–32)
CREATININE: 1.05 mg/dL (ref 0.61–1.24)
Calcium: 8.9 mg/dL (ref 8.9–10.3)
Glucose, Bld: 145 mg/dL — ABNORMAL HIGH (ref 65–99)
Potassium: 3.5 mmol/L (ref 3.5–5.1)
Sodium: 134 mmol/L — ABNORMAL LOW (ref 135–145)

## 2014-10-21 LAB — CBC
HCT: 41.3 % (ref 39.0–52.0)
Hemoglobin: 13.8 g/dL (ref 13.0–17.0)
MCH: 31.7 pg (ref 26.0–34.0)
MCHC: 33.4 g/dL (ref 30.0–36.0)
MCV: 94.9 fL (ref 78.0–100.0)
Platelets: 226 10*3/uL (ref 150–400)
RBC: 4.35 MIL/uL (ref 4.22–5.81)
RDW: 14 % (ref 11.5–15.5)
WBC: 8.3 10*3/uL (ref 4.0–10.5)

## 2014-10-21 LAB — TROPONIN I: TROPONIN I: 0.97 ng/mL — AB (ref <0.031)

## 2014-10-21 LAB — GLUCOSE, CAPILLARY
GLUCOSE-CAPILLARY: 146 mg/dL — AB (ref 65–99)
Glucose-Capillary: 193 mg/dL — ABNORMAL HIGH (ref 65–99)
Glucose-Capillary: 163 mg/dL — ABNORMAL HIGH (ref 65–99)
Glucose-Capillary: 126 mg/dL — ABNORMAL HIGH (ref 65–99)

## 2014-10-21 LAB — HEPARIN LEVEL (UNFRACTIONATED)
Heparin Unfractionated: 0.34 IU/mL (ref 0.30–0.70)
Heparin Unfractionated: 0.36 IU/mL (ref 0.30–0.70)

## 2014-10-21 MED ORDER — SODIUM CHLORIDE 0.9 % IJ SOLN: 3.0000 mL | Freq: Two times a day (BID) | INTRAMUSCULAR | Status: DC

## 2014-10-21 MED ORDER — SODIUM CHLORIDE 0.9 % WEIGHT BASED INFUSION
1.0000 mL/kg/h | INTRAVENOUS | Status: DC
Start: 2014-10-21 — End: 2014-10-22
  Administered 2014-10-22: 1 mL/kg/h via INTRAVENOUS

## 2014-10-21 MED ORDER — ASPIRIN EC 81 MG PO TBEC: 81.0000 mg | DELAYED_RELEASE_TABLET | Freq: Every day | ORAL | Status: DC

## 2014-10-21 MED ORDER — SODIUM CHLORIDE 0.9 % IV SOLN: 250.0000 mL | INTRAVENOUS | Status: DC | PRN

## 2014-10-21 MED ORDER — ASPIRIN 81 MG PO CHEW
81.0000 mg | CHEWABLE_TABLET | ORAL | Status: AC
  Administered 2014-10-22: 81 mg via ORAL
  Filled 2014-10-21: qty 1

## 2014-10-21 MED ORDER — SODIUM CHLORIDE 0.9 % IJ SOLN: 3.0000 mL | INTRAMUSCULAR | Status: DC | PRN

## 2014-10-21 NOTE — Progress Notes (Signed)
Subjective:  No CP/SOB today. In NSR. Trop + with peak of .6 Objective:  Temp:  [97.5 F (36.4 C)-98.2 F (36.8 C)] 98.2 F (36.8 C) (05/30 0441) Pulse Rate:  [68-145] 118 (05/30 0441) Resp:  [18] 18 (05/30 0441) BP: (116-147)/(67-81) 147/81 mmHg (05/30 0441) SpO2:  [97 %-98 %] 98 % (05/30 0441) Weight:  [268 lb 7.9 oz (121.788 kg)] 268 lb 7.9 oz (121.788 kg) (05/30 0441) Weight change: 6.6 oz (0.188 kg)  Intake/Output from previous day: 05/29 0701 - 05/30 0700 In: 1155.3 [P.O.:960; I.V.:195.3] Out: -   Intake/Output from this shift:    Physical Exam: General appearance: alert and no distress Neck: no adenopathy, no carotid bruit, no JVD, supple, symmetrical, trachea midline and thyroid not enlarged, symmetric, no tenderness/mass/nodules Lungs: clear to auscultation bilaterally Heart: regular rate and rhythm, S1, S2 normal, no murmur, click, rub or gallop Extremities: extremities normal, atraumatic, no cyanosis or edema  Lab Results: Results for orders placed or performed during the hospital encounter of 10/20/14 (from the past 48 hour(s))  TSH     Status: None   Collection Time: 10/20/14  7:07 AM  Result Value Ref Range   TSH 2.763 0.350 - 4.500 uIU/mL  Magnesium     Status: None   Collection Time: 10/20/14  7:07 AM  Result Value Ref Range   Magnesium 2.2 1.7 - 2.4 mg/dL  Troponin I     Status: Abnormal   Collection Time: 10/20/14  7:07 AM  Result Value Ref Range   Troponin I 0.19 (H) <0.031 ng/mL    Comment:        PERSISTENTLY INCREASED TROPONIN VALUES IN THE RANGE OF 0.04-0.49 ng/mL CAN BE SEEN IN:       -UNSTABLE ANGINA       -CONGESTIVE HEART FAILURE       -MYOCARDITIS       -CHEST TRAUMA       -ARRYHTHMIAS       -LATE PRESENTING MYOCARDIAL INFARCTION       -COPD   CLINICAL FOLLOW-UP RECOMMENDED.   Glucose, capillary     Status: Abnormal   Collection Time: 10/20/14 11:09 AM  Result Value Ref Range   Glucose-Capillary 185 (H) 65 - 99 mg/dL   Comment 1 Notify RN    Comment 2 Document in Chart   Troponin I     Status: Abnormal   Collection Time: 10/20/14 11:47 AM  Result Value Ref Range   Troponin I 0.35 (H) <0.031 ng/mL    Comment:        PERSISTENTLY INCREASED TROPONIN VALUES IN THE RANGE OF 0.04-0.49 ng/mL CAN BE SEEN IN:       -UNSTABLE ANGINA       -CONGESTIVE HEART FAILURE       -MYOCARDITIS       -CHEST TRAUMA       -ARRYHTHMIAS       -LATE PRESENTING MYOCARDIAL INFARCTION       -COPD   CLINICAL FOLLOW-UP RECOMMENDED.   Glucose, capillary     Status: Abnormal   Collection Time: 10/20/14  5:10 PM  Result Value Ref Range   Glucose-Capillary 126 (H) 65 - 99 mg/dL   Comment 1 Notify RN    Comment 2 Document in Chart   Troponin I     Status: Abnormal   Collection Time: 10/20/14  6:11 PM  Result Value Ref Range   Troponin I 0.61 (HH) <0.031 ng/mL    Comment:  POSSIBLE MYOCARDIAL ISCHEMIA. SERIAL TESTING RECOMMENDED. REPEATED TO VERIFY CRITICAL RESULT CALLED TO, READ BACK BY AND VERIFIED WITH: REY BUNNDIA 1927 10/20/14 BY WOOLLENK   Glucose, capillary     Status: Abnormal   Collection Time: 10/20/14  6:29 PM  Result Value Ref Range   Glucose-Capillary 177 (H) 65 - 99 mg/dL   Comment 1 Notify RN    Comment 2 Document in Chart   Glucose, capillary     Status: Abnormal   Collection Time: 10/20/14  8:53 PM  Result Value Ref Range   Glucose-Capillary 119 (H) 65 - 99 mg/dL   Comment 1 Notify RN   Heparin level (unfractionated)     Status: None   Collection Time: 10/20/14 11:47 PM  Result Value Ref Range   Heparin Unfractionated 0.36 0.30 - 0.70 IU/mL    Comment:        IF HEPARIN RESULTS ARE BELOW EXPECTED VALUES, AND PATIENT DOSAGE HAS BEEN CONFIRMED, SUGGEST FOLLOW UP TESTING OF ANTITHROMBIN III LEVELS.   Glucose, capillary     Status: Abnormal   Collection Time: 10/21/14  7:28 AM  Result Value Ref Range   Glucose-Capillary 193 (H) 65 - 99 mg/dL   Comment 1 Notify RN    Comment 2 Document  in Chart     Imaging: Imaging results have been reviewed  Assessment/Plan:   1. Active Problems: 2.   Atrial fibrillation with RVR 3.   Elevated troponin 4.   Congestive dilated cardiomyopathy 5.   Time Spent Directly with Patient:  25 minutes  Length of Stay:  LOS: 1 day   Pt admitted with CP and AF. + CRF. EF reduced by 2D and nl perfusion on MPI despite decreased EF. Trop peaked at .5961. His AF has become more frequent with assoc. CP, He never filled his Eliquis script. We have discussed ETOH and caffeine. Given his CRF, increasing frequency of Afib and + trop, I favor keeping him in the hospital for radial cath tomorrow. We have discussed this. If his cors are clean, we can get EP to eval and recommend anti arrhythmic Rx.    Dennis King 10/21/2014, 8:17 AM

## 2014-10-21 NOTE — Progress Notes (Signed)
ANTICOAGULATION CONSULT NOTE - Initial Consult  Pharmacy Consult for Heparin  Indication: chest pain/ACS / Afib   Allergies  Allergen Reactions  . Statins Other (See Comments)    Specifically Atorvastatin - myalgia  . Tape Rash    Paper tape please.    Patient Measurements: Height: 5\' 11"  (180.3 cm) Weight: 268 lb 1.3 oz (121.6 kg) IBW/kg (Calculated) : 75.3 Heparin Dosing Weight: 105kg   Vital Signs: Temp: 97.6 F (36.4 C) (05/29 2050) Temp Source: Oral (05/29 2050) BP: 118/81 mmHg (05/29 2050) Pulse Rate: 68 (05/29 2050)  Labs:  Recent Labs  10/20/14 0707 10/20/14 1147 10/20/14 1811 10/20/14 2347  HEPARINUNFRC  --   --   --  0.36  TROPONINI 0.19* 0.35* 0.61*  --     CrCl cannot be calculated (Patient has no serum creatinine result on file.).   Medical History: Past Medical History  Diagnosis Date  . Type 2 diabetes mellitus   . Hypertension   . Hyperlipidemia   . Family history of heart disease   . Obstructive sleep apnea     on C Pap  . Obesity   . Atrial fibrillation      Assessment: 63yo with new Dx Afib May 2016.  He was instructed by MD office to begin apixiban as outpt but did not. His myoview 10/10/14 did not show ischemia but global hypokinesis of LV with EF 30%.   Presented with CP and palpitation in Afib.  Currently CVR 90s with coreg.  His Troponin bumped slightly 0.19>0.35>0.61 and cath planned next week.    Plan to begin heparin for anticoagulation.   Initial HL is therapeutic at 0.36 on heparin 1450 units/hr. No issues with infusion or bleeding noted.  Goal of Therapy:  Heparin level 0.3-0.7 units/ml Monitor platelets by anticoagulation protocol: Yes   Plan:  Continue heparin drip 1450 uts/hr  6h confirmatory HL Daily HL/CBC Ticagrelor 180mg  x1 then 90mg  BID in setting of NSTEMI per MD Hold metformin in setting of NSTEMI and planned cath  [facility] Phys[facility] Regional Medical CenterCorey M. Newman PiesBall, PharmD Clinical Pharmacist Pager [phone]  10/21/2014 12:47 AM

## 2014-10-21 NOTE — Progress Notes (Signed)
ANTICOAGULATION CONSULT NOTE   Pharmacy Consult for Heparin  Indication: chest pain/ACS / Afib   Allergies  Allergen Reactions  . Statins Other (See Comments)    Specifically Atorvastatin - myalgia  . Tape Rash    Paper tape please.    Patient Measurements: Height: 5\' 11"  (180.3 cm) Weight: 268 lb 7.9 oz (121.788 kg) IBW/kg (Calculated) : 75.3 Heparin Dosing Weight: 105kg   Vital Signs: Temp: 98.2 F (36.8 C) (05/30 0441) Temp Source: Oral (05/30 0441) BP: 123/75 mmHg (05/30 1003) Pulse Rate: 118 (05/30 0441)  Labs:  Recent Labs  10/20/14 1147 10/20/14 1811 10/20/14 2347 10/21/14 0900 10/21/14 0940  HEPARINUNFRC  --   --  0.36  --  0.34  TROPONINI 0.35* 0.61*  --  0.97*  --     CrCl cannot be calculated (Patient has no serum creatinine result on file.).   Medical History: Past Medical History  Diagnosis Date  . Type 2 diabetes mellitus   . Hypertension   . Hyperlipidemia   . Family history of heart disease   . Obstructive sleep apnea     on C Pap  . Obesity   . Atrial fibrillation      Assessment: 63yo with new Dx Afib May 2016.  He was instructed by MD office to begin apixiban as outpt but did not. His myoview 10/10/14 did not show ischemia but global hypokinesis of LV with EF 30%.   Presented with CP and palpitation in Afib.  Currently CVR 90s with coreg.  His Troponin bumped slightly 0.19>0.35>0.61 and cath planned next week.    Plan to begin heparin for anticoagulation.   Initial HL is therapeutic x2 at 0.34 on heparin 1450 units/hr. No issues with infusion or bleeding noted.  Goal of Therapy:  Heparin level 0.3-0.7 units/ml Monitor platelets by anticoagulation protocol: Yes   Plan:  Continue heparin drip 1450 uts/hr  Daily HL/CBC Ticagrelor 180mg  x1 then 90mg  BID in setting of NSTEMI per MD Hold metformin in setting of NSTEMI and planned cath Plan for cath 5/31  Sheppard CoilFrank Wilson PharmD., BCPS Clinical Pharmacist Pager [phone] 10/21/2014  11:23 AM

## 2014-10-21 NOTE — Progress Notes (Signed)
Pt is using home CPAP machine and nasal pillows (pressure unknown). CPAP appears to be in good working condition and there are no frays in the power cord. Pt states he does not need any assistance with mask application. RT will continue to monitor as needed.

## 2014-10-22 ENCOUNTER — Encounter (HOSPITAL_COMMUNITY)
Admission: AD | Disposition: A | Discharge status: Home / Self Care | Payer: 59 | Source: Ambulatory Visit | Attending: Cardiology

## 2014-10-22 DIAGNOSIS — I2510 Atherosclerotic heart disease of native coronary artery without angina pectoris: Secondary | ICD-10-CM

## 2014-10-22 DIAGNOSIS — I214 Non-ST elevation (NSTEMI) myocardial infarction: Principal | ICD-10-CM

## 2014-10-22 HISTORY — PX: CARDIAC CATHETERIZATION: SHX172

## 2014-10-22 LAB — PROTIME-INR
INR: 1.12 (ref 0.00–1.49)
Prothrombin Time: 14.6 s (ref 11.6–15.2)

## 2014-10-22 LAB — CBC
HEMATOCRIT: 41.8 % (ref 39.0–52.0)
Hemoglobin: 13.9 g/dL (ref 13.0–17.0)
MCH: 31.7 pg (ref 26.0–34.0)
MCHC: 33.3 g/dL (ref 30.0–36.0)
MCV: 95.4 fL (ref 78.0–100.0)
Platelets: 222 10*3/uL (ref 150–400)
RBC: 4.38 MIL/uL (ref 4.22–5.81)
RDW: 14 % (ref 11.5–15.5)
WBC: 8.4 10*3/uL (ref 4.0–10.5)

## 2014-10-22 LAB — GLUCOSE, CAPILLARY
GLUCOSE-CAPILLARY: 164 mg/dL — AB (ref 65–99)
GLUCOSE-CAPILLARY: 130 mg/dL — AB (ref 65–99)
GLUCOSE-CAPILLARY: 185 mg/dL — AB (ref 65–99)
Glucose-Capillary: 141 mg/dL — ABNORMAL HIGH (ref 65–99)
Glucose-Capillary: 176 mg/dL — ABNORMAL HIGH (ref 65–99)

## 2014-10-22 LAB — HEPARIN LEVEL (UNFRACTIONATED): Heparin Unfractionated: 0.42 [IU]/mL (ref 0.30–0.70)

## 2014-10-22 LAB — BASIC METABOLIC PANEL WITH GFR
Anion gap: 9 (ref 5–15)
BUN: 18 mg/dL (ref 6–20)
CO2: 27 mmol/L (ref 22–32)
Calcium: 8.9 mg/dL (ref 8.9–10.3)
Chloride: 101 mmol/L (ref 101–111)
Creatinine, Ser: 1.11 mg/dL (ref 0.61–1.24)
GFR calc Af Amer: >60 mL/min
GFR calc non Af Amer: >60 mL/min
Glucose, Bld: 182 mg/dL — ABNORMAL HIGH (ref 65–99)
Potassium: 3.3 mmol/L — ABNORMAL LOW (ref 3.5–5.1)
Sodium: 137 mmol/L (ref 135–145)

## 2014-10-22 LAB — POCT ACTIVATED CLOTTING TIME: ACTIVATED CLOTTING TIME: 263 s

## 2014-10-22 SURGERY — LEFT HEART CATH AND CORONARY ANGIOGRAPHY
Anesthesia: LOCAL | Site: Wrist | Laterality: Right

## 2014-10-22 MED ORDER — VERAPAMIL HCL 2.5 MG/ML IV SOLN
INTRAVENOUS | Status: DC | PRN
  Administered 2014-10-22: 17:00:00 via INTRA_ARTERIAL

## 2014-10-22 MED ORDER — EMPAGLIFLOZIN 10 MG PO TABS
10.0000 mg | ORAL_TABLET | Freq: Every day | ORAL | Status: DC
  Administered 2014-10-22 – 2014-10-23 (×2): 10 mg via ORAL
  Filled 2014-10-22 (×3): qty 10

## 2014-10-22 MED ORDER — CLOPIDOGREL BISULFATE 75 MG PO TABS
ORAL_TABLET | ORAL | Status: DC | PRN
  Administered 2014-10-22: 600 mg via ORAL

## 2014-10-22 MED ORDER — HEPARIN SODIUM (PORCINE) 1000 UNIT/ML IJ SOLN
INTRAMUSCULAR | Status: AC
  Filled 2014-10-22: qty 1

## 2014-10-22 MED ORDER — ONDANSETRON HCL 4 MG/2ML IJ SOLN: 4.0000 mg | Freq: Four times a day (QID) | INTRAMUSCULAR | Status: DC | PRN

## 2014-10-22 MED ORDER — FENTANYL CITRATE (PF) 100 MCG/2ML IJ SOLN
INTRAMUSCULAR | Status: AC
Start: 2014-10-22 — End: 2014-10-22
  Filled 2014-10-22: qty 2

## 2014-10-22 MED ORDER — MIDAZOLAM HCL 2 MG/2ML IJ SOLN
INTRAMUSCULAR | Status: AC
  Filled 2014-10-22: qty 2

## 2014-10-22 MED ORDER — CARVEDILOL 12.5 MG PO TABS: 25.0000 mg | ORAL_TABLET | ORAL | Status: DC

## 2014-10-22 MED ORDER — METFORMIN HCL 500 MG PO TABS: 1000.0000 mg | ORAL_TABLET | Freq: Two times a day (BID) | ORAL | Status: DC

## 2014-10-22 MED ORDER — CLOPIDOGREL BISULFATE 300 MG PO TABS: ORAL_TABLET | ORAL | Status: DC | PRN

## 2014-10-22 MED ORDER — TIROFIBAN HCL IV 5 MG/100ML
0.1500 ug/kg/min | INTRAVENOUS | Status: AC
  Filled 2014-10-22: qty 100

## 2014-10-22 MED ORDER — TIROFIBAN HCL IV 5 MG/100ML
INTRAVENOUS | Status: AC
  Filled 2014-10-22: qty 100

## 2014-10-22 MED ORDER — SODIUM CHLORIDE 0.9 % IJ SOLN: 3.0000 mL | INTRAMUSCULAR | Status: DC | PRN

## 2014-10-22 MED ORDER — FENTANYL CITRATE (PF) 100 MCG/2ML IJ SOLN
INTRAMUSCULAR | Status: DC | PRN
  Administered 2014-10-22 (×2): 25 ug via INTRAVENOUS

## 2014-10-22 MED ORDER — IOHEXOL 350 MG/ML SOLN
INTRAVENOUS | Status: DC | PRN
  Administered 2014-10-22: 160 mL via INTRACARDIAC

## 2014-10-22 MED ORDER — CLOPIDOGREL BISULFATE 75 MG PO TABS
75.0000 mg | ORAL_TABLET | Freq: Every day | ORAL | Status: DC
  Administered 2014-10-23: 75 mg via ORAL
  Filled 2014-10-22: qty 1

## 2014-10-22 MED ORDER — TIROFIBAN HCL IV 5 MG/100ML
0.1500 ug/kg/min | INTRAVENOUS | Status: DC
Start: 2014-10-22 — End: 2014-10-23
  Filled 2014-10-22 (×8): qty 100

## 2014-10-22 MED ORDER — VERAPAMIL HCL 2.5 MG/ML IV SOLN
INTRAVENOUS | Status: AC
  Filled 2014-10-22: qty 2

## 2014-10-22 MED ORDER — SODIUM CHLORIDE 0.9 % WEIGHT BASED INFUSION: 1.0000 mL/kg/h | INTRAVENOUS | Status: AC

## 2014-10-22 MED ORDER — METFORMIN HCL 500 MG PO TABS
1000.0000 mg | ORAL_TABLET | Freq: Two times a day (BID) | ORAL | Status: DC
  Filled 2014-10-22: qty 2

## 2014-10-22 MED ORDER — ACETAMINOPHEN 325 MG PO TABS: 650.0000 mg | ORAL_TABLET | ORAL | Status: DC | PRN

## 2014-10-22 MED ORDER — SODIUM CHLORIDE 0.9 % IV SOLN: 250.0000 mL | INTRAVENOUS | Status: DC | PRN

## 2014-10-22 MED ORDER — MIDAZOLAM HCL 2 MG/2ML IJ SOLN
INTRAMUSCULAR | Status: DC | PRN
  Administered 2014-10-22 (×2): 1 mg via INTRAVENOUS

## 2014-10-22 MED ORDER — HEPARIN SODIUM (PORCINE) 1000 UNIT/ML IJ SOLN
INTRAMUSCULAR | Status: DC | PRN
  Administered 2014-10-22 (×2): 5000 [IU] via INTRAVENOUS

## 2014-10-22 MED ORDER — ASPIRIN EC 81 MG PO TBEC
81.0000 mg | DELAYED_RELEASE_TABLET | Freq: Every day | ORAL | Status: DC
  Administered 2014-10-23: 10:00:00 81 mg via ORAL
  Filled 2014-10-22: qty 1

## 2014-10-22 MED ORDER — NITROGLYCERIN 1 MG/10 ML FOR IR/CATH LAB
INTRA_ARTERIAL | Status: AC
  Filled 2014-10-22: qty 10

## 2014-10-22 MED ORDER — ASPIRIN 81 MG PO CHEW: 81.0000 mg | CHEWABLE_TABLET | Freq: Every day | ORAL | Status: DC

## 2014-10-22 MED ORDER — FENTANYL CITRATE (PF) 100 MCG/2ML IJ SOLN
INTRAMUSCULAR | Status: AC
  Filled 2014-10-22: qty 2

## 2014-10-22 MED ORDER — CLOPIDOGREL BISULFATE 300 MG PO TABS
ORAL_TABLET | ORAL | Status: AC
  Filled 2014-10-22: qty 2

## 2014-10-22 MED ORDER — TIROFIBAN HCL IV 5 MG/100ML
INTRAVENOUS | Status: DC | PRN
  Administered 2014-10-22: 0.15 ug/kg/min via INTRAVENOUS

## 2014-10-22 MED ORDER — HEPARIN (PORCINE) IN NACL 2-0.9 UNIT/ML-% IJ SOLN
INTRAMUSCULAR | Status: AC
  Filled 2014-10-22: qty 1000

## 2014-10-22 MED ORDER — POTASSIUM CHLORIDE CRYS ER 20 MEQ PO TBCR
40.0000 meq | EXTENDED_RELEASE_TABLET | Freq: Once | ORAL | Status: AC
  Administered 2014-10-22: 40 meq via ORAL
  Filled 2014-10-22: qty 2

## 2014-10-22 MED ORDER — LIDOCAINE HCL (PF) 1 % IJ SOLN
INTRAMUSCULAR | Status: AC
  Filled 2014-10-22: qty 30

## 2014-10-22 MED ORDER — SODIUM CHLORIDE 0.9 % IJ SOLN: 3.0000 mL | Freq: Two times a day (BID) | INTRAMUSCULAR | Status: DC

## 2014-10-22 SURGICAL SUPPLY — 19 items
BALLN ANGIOSCULPT RX 2.5X10 (BALLOONS) ×3
BALLOON ANGIOSCULPT RX 2.5X10 (BALLOONS) ×2 IMPLANT
CATH INFINITI 5 FR JL3.5 (CATHETERS) ×3 IMPLANT
CATH INFINITI 5FR ANG PIGTAIL (CATHETERS) ×3 IMPLANT
CATH INFINITI 5FR MULTPACK ANG (CATHETERS) IMPLANT
CATH INFINITI JR4 5F (CATHETERS) ×3 IMPLANT
DEVICE RAD COMP TR BAND LRG (VASCULAR PRODUCTS) ×3 IMPLANT
GLIDESHEATH SLEND SS 6F .021 (SHEATH) ×3 IMPLANT
GUIDE CATH RUNWAY 6FR CLS3 (CATHETERS) ×3 IMPLANT
KIT ENCORE 26 ADVANTAGE (KITS) ×3 IMPLANT
KIT HEART LEFT (KITS) ×3 IMPLANT
PACK CARDIAC CATHETERIZATION (CUSTOM PROCEDURE TRAY) ×3 IMPLANT
SHEATH PINNACLE 5F 10CM (SHEATH) IMPLANT
SYR MEDRAD MARK V 150ML (SYRINGE) ×3 IMPLANT
TRANSDUCER W/STOPCOCK (MISCELLANEOUS) ×3 IMPLANT
TUBING CIL FLEX 10 FLL-RA (TUBING) ×3 IMPLANT
WIRE ASAHI PROWATER 180CM (WIRE) ×3 IMPLANT
WIRE EMERALD 3MM-J .035X150CM (WIRE) IMPLANT
WIRE SAFE-T 1.5MM-J .035X260CM (WIRE) ×3 IMPLANT

## 2014-10-22 NOTE — Progress Notes (Signed)
    Subjective: No CP   Objective: Vital signs in last 24 hours: Temp:  [97.4 F (36.3 C)-97.9 F (36.6 C)] 97.4 F (36.3 C) (05/31 0555) Pulse Rate:  [66-68] 68 (05/31 0555) Resp:  [18] 18 (05/31 0555) BP: (123-139)/(66-81) 139/66 mmHg (05/31 0555) SpO2:  [97 %-100 %] 99 % (05/31 0555) Weight:  [259 lb 14.4 oz (117.89 kg)-268 lb 4.8 oz (121.7 kg)] 259 lb 14.4 oz (117.89 kg) (05/31 0555) Last BM Date: 10/21/14  Intake/Output from previous day: 05/30 0701 - 05/31 0700 In: 1068 [P.O.:720; I.V.:348] Out: 1650 [Urine:1650] Intake/Output this shift:    Medications Scheduled Meds: . [START ON 10/23/2014] aspirin EC  81 mg Oral Daily  . carvedilol  25 mg Oral Q breakfast   And  . carvedilol  12.5 mg Oral QAC supper  . empagliflozin  10 mg Oral Daily  . insulin aspart  0-15 Units Subcutaneous TID WC  . insulin aspart protamine- aspart  15 Units Subcutaneous BID WC  . lisinopril  20 mg Oral Daily  . rosuvastatin  20 mg Oral q1800  . sodium chloride  3 mL Intravenous Q12H  . sodium chloride  3 mL Intravenous Q12H   Continuous Infusions: . sodium chloride    . heparin 1,450 Units/hr (10/21/14 2252)   PRN Meds:.sodium chloride, sodium chloride, acetaminophen, ondansetron (ZOFRAN) IV, sodium chloride, sodium chloride, sodium chloride  PE: General appearance: alert, cooperative and no distress Lungs: clear to auscultation bilaterally Heart: regular rate and rhythm, S1, S2 normal, no murmur, click, rub or gallop Extremities: No LEE Pulses: 2+ and symmetric Skin: warm and dry Neurologic: Grossly normal  Lab Results:   Recent Labs  10/21/14 1955 10/22/14 0257  WBC 8.3 8.4  HGB 13.8 13.9  HCT 41.3 41.8  PLT 226 222   BMET  Recent Labs  10/21/14 1955 10/22/14 0257  NA 134* 137  K 3.5 3.3*  CL 102 101  CO2 24 27  GLUCOSE 145* 182*  BUN 19 18  CREATININE 1.05 1.11  CALCIUM 8.9 8.9   Cardiac Panel (last 3 results)  Recent Labs  10/20/14 1147 10/20/14 1811  10/21/14 0900  TROPONINI 0.35* 0.61* 0.97*      Assessment/Plan  Active Problems:   Atrial fibrillation with RVR  Maintaining SR. IV heparin.  Never filled eliquis script.    Elevated troponin Recent normal stress test.  Troponin up to 0.97.  May be related to Afib.  He develops chest pressure when in Afib RVR.   Left heart cath today ~ 1630hrs.  ASA, heparin, coreg 25 bid,      Congestive dilated cardiomyopathy  ACE-I, EF 45-50%, diffuse hypokinesis.    DM:  Holding 70/30 today and oral meds.     Hypokalemia: replace     LOS: 2 days    HAGER, BRYAN PA-C 10/22/2014 8:45 AM  Patient seen, examined. Available data reviewed. Agree with findings, assessment, and plan as outlined by Bryan Hager, PA-C. Exam reveals an alert, oriented male in no distress. Heart is regular without murmur or gallop. There is no peripheral edema. He has chest discomfort associated with atrial fibrillation. His troponin is positive. Plans for cardiac catheterization and possible angioplasty/stenting today. We also discussed strategies related to his atrial fibrillation and he may need rhythm control since he is so symptomatic with episodes of atrial fib.  Javell Blackburn, M.D. 10/22/2014 1:39 PM   

## 2014-10-22 NOTE — H&P (View-Only) (Signed)
    Subjective: No CP   Objective: Vital signs in last 24 hours: Temp:  [97.4 F (36.3 C)-97.9 F (36.6 C)] 97.4 F (36.3 C) (05/31 0555) Pulse Rate:  [66-68] 68 (05/31 0555) Resp:  [18] 18 (05/31 0555) BP: (123-139)/(66-81) 139/66 mmHg (05/31 0555) SpO2:  [97 %-100 %] 99 % (05/31 0555) Weight:  [259 lb 14.4 oz (117.89 kg)-268 lb 4.8 oz (121.7 kg)] 259 lb 14.4 oz (117.89 kg) (05/31 0555) Last BM Date: 10/21/14  Intake/Output from previous day: 05/30 0701 - 05/31 0700 In: 1068 [P.O.:720; I.V.:348] Out: 1650 [Urine:1650] Intake/Output this shift:    Medications Scheduled Meds: . [START ON 10/23/2014] aspirin EC  81 mg Oral Daily  . carvedilol  25 mg Oral Q breakfast   And  . carvedilol  12.5 mg Oral QAC supper  . empagliflozin  10 mg Oral Daily  . insulin aspart  0-15 Units Subcutaneous TID WC  . insulin aspart protamine- aspart  15 Units Subcutaneous BID WC  . lisinopril  20 mg Oral Daily  . rosuvastatin  20 mg Oral q1800  . sodium chloride  3 mL Intravenous Q12H  . sodium chloride  3 mL Intravenous Q12H   Continuous Infusions: . sodium chloride    . heparin 1,450 Units/hr (10/21/14 2252)   PRN Meds:.sodium chloride, sodium chloride, acetaminophen, ondansetron (ZOFRAN) IV, sodium chloride, sodium chloride, sodium chloride  PE: General appearance: alert, cooperative and no distress Lungs: clear to auscultation bilaterally Heart: regular rate and rhythm, S1, S2 normal, no murmur, click, rub or gallop Extremities: No LEE Pulses: 2+ and symmetric Skin: warm and dry Neurologic: Grossly normal  Lab Results:   Recent Labs  10/21/14 1955 10/22/14 0257  WBC 8.3 8.4  HGB 13.8 13.9  HCT 41.3 41.8  PLT 226 222   BMET  Recent Labs  10/21/14 1955 10/22/14 0257  NA 134* 137  K 3.5 3.3*  CL 102 101  CO2 24 27  GLUCOSE 145* 182*  BUN 19 18  CREATININE 1.05 1.11  CALCIUM 8.9 8.9   Cardiac Panel (last 3 results)  Recent Labs  10/20/14 1147 10/20/14 1811  10/21/14 0900  TROPONINI 0.35* 0.61* 0.97*      Assessment/Plan  Active Problems:   Atrial fibrillation with RVR  Maintaining SR. IV heparin.  Never filled eliquis script.    Elevated troponin Recent normal stress test.  Troponin up to 0.97.  May be related to Afib.  He develops chest pressure when in Afib RVR.   Left heart cath today ~ 1630hrs.  ASA, heparin, coreg 25 bid,      Congestive dilated cardiomyopathy  ACE-I, EF 45-50%, diffuse hypokinesis.    DM:  Holding 70/30 today and oral meds.     Hypokalemia: replace     LOS: 2 days    HAGER, BRYAN PA-C 10/22/2014 8:45 AM  Patient seen, examined. Available data reviewed. Agree with findings, assessment, and plan as outlined by Wilburt FinlayBryan Hager, PA-C. Exam reveals an alert, oriented male in no distress. Heart is regular without murmur or gallop. There is no peripheral edema. He has chest discomfort associated with atrial fibrillation. His troponin is positive. Plans for cardiac catheterization and possible angioplasty/stenting today. We also discussed strategies related to his atrial fibrillation and he may need rhythm control since he is so symptomatic with episodes of atrial fib.  Tonny BollmanMichael Maris Abascal, M.D. 10/22/2014 1:39 PM

## 2014-10-22 NOTE — Interval H&P Note (Signed)
Cath Lab Visit (complete for each Cath Lab visit)  Clinical Evaluation Leading to the Procedure:   ACS: Yes.    Non-ACS:    Anginal Classification: CCS IV  Anti-ischemic medical therapy: Minimal Therapy (1 class of medications)  Non-Invasive Test Results: No non-invasive testing performed  Prior CABG: No previous CABG   TIMI Score  Patient Information:  TIMI Score is 3   UA/NSTEMI and intermediate-risk features (e.g., TIMI score 3-4) for short-term risk of death or nonfatal MI  Revascularization of the presumed culprit artery   A (9)  Indication: 10; Score: 9    History and Physical Interval Note:  10/22/2014 4:36 PM  Dennis King  has presented today for surgery, with the diagnosis of unstable angina  The various methods of treatment have been discussed with the patient and family. After consideration of risks, benefits and other options for treatment, the patient has consented to  Procedure(s): Left Heart Cath and Coronary Angiography (N/A) as a surgical intervention .  The patient's history has been reviewed, patient examined, no change in status, stable for surgery.  I have reviewed the patient's chart and labs.  Questions were answered to the patient's satisfaction.     Sarely Stracener S.

## 2014-10-22 NOTE — Progress Notes (Signed)
UR Completed Erlinda Solinger Graves-Bigelow, RN,BSN [phone]  

## 2014-10-22 NOTE — Care Management Note (Signed)
Case Management Note  Patient Details  Name: Ned ClinesRobert Schickling MRN: [id number] Date of Birth: [birth year]  Subjective/Objective:   Pt admitted for Afib RVR. Plan for cath today.                  Action/Plan: No needs identified by CM at this time.    Expected Discharge Date:  10/21/14               Expected Discharge Plan:  Home/Self Care  In-House Referral:     Discharge planning Services  CM Consult  Post Acute Care Choice:    Choice offered to:     DME Arranged:    DME Agency:     HH Arranged:    HH Agency:     Status of Service:  Completed, signed off  Medicare Important Message Given:  No Date Medicare IM Given:    Medicare IM give by:    Date Additional Medicare IM Given:    Additional Medicare Important Message give by:     If discussed at Long Length of Stay Meetings, dates discussed:    Additional Comments:  Gala LewandowskyGraves-Bigelow, Jolynn Bajorek Kaye, RN 10/22/2014, 2:28 PM

## 2014-10-22 NOTE — Progress Notes (Signed)
ANTICOAGULATION CONSULT NOTE - follow up   Pharmacy Consult for Heparin  Indication: chest pain/ACS / Afib   Allergies  Allergen Reactions  . Statins Other (See Comments)    Specifically Atorvastatin - myalgia  . Tape Rash    Paper tape please.    Patient Measurements: Height: 5\' 11"  (180.3 cm) Weight: 259 lb 14.4 oz (117.89 kg) IBW/kg (Calculated) : 75.3 Heparin Dosing Weight: 105kg   Vital Signs: Temp: 97.4 F (36.3 C) (05/31 0555) Temp Source: Oral (05/31 0555) BP: 139/66 mmHg (05/31 0555) Pulse Rate: 68 (05/31 0555)  Labs:  Recent Labs  10/20/14 1147 10/20/14 1811 10/20/14 2347 10/21/14 0900 10/21/14 0940 10/21/14 1955 10/22/14 0257 10/22/14 1130  HGB  --   --   --   --   --  13.8 13.9  --   HCT  --   --   --   --   --  41.3 41.8  --   PLT  --   --   --   --   --  226 222  --   LABPROT  --   --   --   --   --   --   --  14.6  INR  --   --   --   --   --   --   --  1.12  HEPARINUNFRC  --   --  0.36  --  0.34  --  0.42  --   CREATININE  --   --   --   --   --  1.05 1.11  --   TROPONINI 0.35* 0.61*  --  0.97*  --   --   --   --     Estimated Creatinine Clearance: 90.1 mL/min (by C-G formula based on Cr of 1.11).   Medical History: Past Medical History  Diagnosis Date  . Type 2 diabetes mellitus   . Hypertension   . Hyperlipidemia   . Family history of heart disease   . Obstructive sleep apnea     on C Pap  . Obesity   . Atrial fibrillation      Assessment: 63yo with new Dx Afib May 2016.  He was instructed by MD office to begin apixiban as outpt but did not. His myoview 10/10/14 did not show ischemia but global hypokinesis of LV with EF 30%.   Presented on 10/20/14 with CP and palpitation in Afib. Elevated troponin. Pharmacy consulted to manage IV heparin for anticoagulation.   This morning's heparin level remains therapeutic, HL =0.42 on heparin 1450 units/hr. No issues with infusion or bleeding reported. Troponin up to 0.97. Plan for left heart  cath today ~ 1630hrs. Atrial fibrillation with RVR:  Cardiologist notes today: maintaining SR. IV heparin. Patient never filled eliquis script.   Metformin on hold in setting of NSTEMI and planned cath  Goal of Therapy:  Heparin level 0.3-0.7 units/ml Monitor platelets by anticoagulation protocol: Yes   Plan:  Continue heparin drip 1450 uts/hr  Daily HL/CBC Plan for Left heart cath today ~ 1630hrs   Dennis King, RPh Clinical Pharmacist Pager: [phone] 10/22/2014 1:39 PM

## 2014-10-23 ENCOUNTER — Encounter (HOSPITAL_COMMUNITY): Payer: Self-pay | Admitting: Interventional Cardiology

## 2014-10-23 DIAGNOSIS — Z9861 Coronary angioplasty status: Secondary | ICD-10-CM

## 2014-10-23 DIAGNOSIS — I2510 Atherosclerotic heart disease of native coronary artery without angina pectoris: Secondary | ICD-10-CM

## 2014-10-23 LAB — CBC
HEMATOCRIT: 40.5 % (ref 39.0–52.0)
Hemoglobin: 13.3 g/dL (ref 13.0–17.0)
MCH: 31.7 pg (ref 26.0–34.0)
MCHC: 32.8 g/dL (ref 30.0–36.0)
MCV: 96.4 fL (ref 78.0–100.0)
Platelets: 215 10*3/uL (ref 150–400)
RBC: 4.2 MIL/uL — ABNORMAL LOW (ref 4.22–5.81)
RDW: 13.9 % (ref 11.5–15.5)
WBC: 6.9 10*3/uL (ref 4.0–10.5)

## 2014-10-23 LAB — BASIC METABOLIC PANEL
Anion gap: 13 (ref 5–15)
BUN: 20 mg/dL (ref 6–20)
CHLORIDE: 103 mmol/L (ref 101–111)
CO2: 21 mmol/L — AB (ref 22–32)
CREATININE: 1.02 mg/dL (ref 0.61–1.24)
Calcium: 8.7 mg/dL — ABNORMAL LOW (ref 8.9–10.3)
GFR calc Af Amer: >60 mL/min (ref >60)
GFR calc non Af Amer: >60 mL/min (ref >60)
Glucose, Bld: 139 mg/dL — ABNORMAL HIGH (ref 65–99)
Potassium: 4 mmol/L (ref 3.5–5.1)
Sodium: 137 mmol/L (ref 135–145)

## 2014-10-23 LAB — GLUCOSE, CAPILLARY: GLUCOSE-CAPILLARY: 152 mg/dL — AB (ref 65–99)

## 2014-10-23 MED ORDER — CLOPIDOGREL BISULFATE 75 MG PO TABS: 75.0000 mg | ORAL_TABLET | Freq: Every day | ORAL | Status: DC

## 2014-10-23 MED ORDER — ACETAMINOPHEN 325 MG PO TABS: 650.0000 mg | ORAL_TABLET | ORAL | Status: DC | PRN

## 2014-10-23 MED ORDER — ROSUVASTATIN CALCIUM 20 MG PO TABS: 20.0000 mg | ORAL_TABLET | Freq: Every day | ORAL | Status: DC

## 2014-10-23 MED ORDER — NITROGLYCERIN 0.4 MG SL SUBL: 0.4000 mg | SUBLINGUAL_TABLET | SUBLINGUAL | Status: DC | PRN

## 2014-10-23 MED ORDER — GLUCOPHAGE 1000 MG PO TABS: 1000.0000 mg | ORAL_TABLET | Freq: Two times a day (BID) | ORAL | Status: DC

## 2014-10-23 MED FILL — Lidocaine HCl Local Preservative Free (PF) Inj 1%: INTRAMUSCULAR | Qty: 30 | Status: AC

## 2014-10-23 MED FILL — Clopidogrel Bisulfate Tab 300 MG (Base Equiv): ORAL | Qty: 1 | Status: AC

## 2014-10-23 MED FILL — Heparin Sodium (Porcine) 2 Unit/ML in Sodium Chloride 0.9%: INTRAMUSCULAR | Qty: 1000 | Status: AC

## 2014-10-23 NOTE — Discharge Summary (Signed)
Patient ID: Dennis King,  MRN: [id number], DOB/AGE: [birth year] 63 y.o.  Admit date: 10/20/2014 Discharge date: 10/23/2014  Primary Care Provider: Lenora Boys, MD Primary Cardiologist: Dr Allyson Sabal  Discharge Diagnoses Principal Problem:   NSTEMI (non-ST elevated myocardial infarction) Active Problems:   CAD S/P ostial OM2 POBA   Essential hypertension   Hyperlipidemia   Diabetes   PAF- CHADS VASc= 3 (HTN, DM, CAD).    Obstructive sleep apnea-on C-pap    Procedures: Cath/ PTCA OM2 10/22/14   Hospital Course:  64 y/o patient with a history of NIDDM, HTN, dyslipidemia, and a FMHX of CAD,  presented to [facility] 10/20/14 with chest pain and palpitations. He had seen Dr. Allyson Sabal recently as an OP with complaints of palpitations. He was thought to probably have atrial fib. He is wearing an event monitor. This demonstrated atrial fib with multiple episodes of rapid rate. He has been treated with increasing doses of Coreg. He has been given a prescription for Eliquis but did not start this. He takes Aleve for joint pain and didn't want to stop this. He has been taking ASA. An exercise Myoview demonstrated no ischemia. However, the EF was low at 30-44%. On echo the EF was 45-50% with diffuse hypokinesis.         The pt was admitted and ruled in for a NSTEMI with a peak Troponin of 0.97. Heparin was started.  The pt was reportedly in AF on admission but converted spontaneously to NSR shortly afterwards.  He was stable over the weekend and underwent cath 10/22/14 by Dr [name]. This revealed an ostial OM2 lesion. This was treated with angioplasty and he achieved an excellent result.  The pt was seen b7y Dr Excell Seltzer the morning of June 1st.  He feels the pt is a CHADS-Vasc = 3 (HTN, DM, CAD).  He recomends ASA/plavix x 30 days, then consider a DOAC. We have arranged for the pt to be evaluated in the AF clinic in two weeks to discuss options - he is interested in rhythm control  strategies as he is symptomatic with AF occurrences. He'll follow up with Dr Allyson Sabal in 6 weeks.  Discharge Vitals:  Blood pressure 145/78, pulse 70, temperature 97.7 F (36.5 C), temperature source Oral, resp. rate 20, height  (1.803 m), weight 255 lb 11.7 oz (116 kg), SpO2 95 %.    Labs: Results for orders placed or performed during the hospital encounter of 10/20/14 (from the past 24 hour(s))  Glucose, capillary     Status: Abnormal   Collection Time: 10/22/14 11:09 AM  Result Value Ref Range   Glucose-Capillary 164 (H) 65 - 99 mg/dL   Comment 1 Notify RN    Comment 2 Document in Chart   Protime-INR     Status: None   Collection Time: 10/22/14 11:30 AM  Result Value Ref Range   Prothrombin Time 14.6 11.6 - 15.2 seconds   INR 1.12 0.00 - 1.49  POCT Activated clotting time     Status: None   Collection Time: 10/22/14  5:10 PM  Result Value Ref Range   Activated Clotting Time 263 seconds  Glucose, capillary     Status: Abnormal   Collection Time: 10/22/14  6:05 PM  Result Value Ref Range   Glucose-Capillary 130 (H) 65 - 99 mg/dL  Glucose, capillary     Status: Abnormal   Collection Time: 10/22/14  9:52 PM  Result Value Ref Range   Glucose-Capillary 185 (H) 65 -  99 mg/dL   Comment 1 Notify RN    Comment 2 Document in Chart   Basic metabolic panel     Status: Abnormal   Collection Time: 10/23/14  3:57 AM  Result Value Ref Range   Sodium 137 135 - 145 mmol/L   Potassium 4.0 3.5 - 5.1 mmol/L   Chloride 103 101 - 111 mmol/L   CO2 21 (L) 22 - 32 mmol/L   Glucose, Bld 139 (H) 65 - 99 mg/dL   BUN 20 6 - 20 mg/dL   Creatinine, Ser 1.61 0.61 - 1.24 mg/dL   Calcium 8.7 (L) 8.9 - 10.3 mg/dL   GFR calc non Af Amer >60 >60 mL/min   GFR calc Af Amer >60 >60 mL/min   Anion gap 13 5 - 15  CBC     Status: Abnormal   Collection Time: 10/23/14  3:57 AM  Result Value Ref Range   WBC 6.9 4.0 - 10.5 K/uL   RBC 4.20 (L) 4.22 - 5.81 MIL/uL   Hemoglobin 13.3 13.0 - 17.0 g/dL   HCT  09.6 04.5 - 40.9 %   MCV 96.4 78.0 - 100.0 fL   MCH 31.7 26.0 - 34.0 pg   MCHC 32.8 30.0 - 36.0 g/dL   RDW 81.1 91.4 - 78.2 %   Platelets 215 150 - 400 K/uL  Glucose, capillary     Status: Abnormal   Collection Time: 10/23/14  7:01 AM  Result Value Ref Range   Glucose-Capillary 152 (H) 65 - 99 mg/dL    Disposition:      Follow-up Information    Follow up with CARROLL,DONNA, NP On 11/07/2014.   Specialty:  Nurse Practitioner   Why: 63:30 am   Contact information:   [address] [place] [address] [phone]       Follow up with Nanetta Batty, MD.   Specialties:  Cardiology, Radiology   Why:  office will call you   Contact information:   [address] [place] [address] [phone]       Discharge Medications:    Medication List    STOP taking these medications        apixaban 5 MG Tabs tablet  Commonly known as:  ELIQUIS     GLUCOPHAGE 1000 MG tablet  Generic drug:  metFORMIN     metFORMIN 1000 MG tablet  Commonly known as:  GLUCOPHAGE      TAKE these medications        acetaminophen 325 MG tablet  Commonly known as:  TYLENOL  Take 2 tablets (650 mg total) by mouth every 4 (four) hours as needed for headache or mild pain. 64     aspirin EC 81 MG tablet  Take 81 mg by mouth daily.     carvedilol 12.5 MG tablet  Commonly known as:  COREG  Take 12.5 mg by mouth at bedtime. Additional 12.5 if arrhythmia occurs.     carvedilol 25 MG tablet  Commonly known as:  COREG  Take 25 mg by mouth every morning.     clopidogrel 75 MG tablet  Commonly known as:  PLAVIX  Take 1 tablet (75 mg total) by mouth daily with breakfast.     empagliflozin 10 MG Tabs tablet  Commonly known as:  JARDIANCE  Take 10 mg by mouth daily.     HUMALOG MIX 75/25 (75-25) 100 UNIT/ML Susp injection  Generic drug:  insulin lispro protamine-lispro  Inject 15-40 Units into the skin 2 (  two) times daily with a meal.     lisinopril-hydrochlorothiazide 20-25 MG  per tablet  Commonly known as:  PRINZIDE,ZESTORETIC  Take 1 tablet by mouth daily.     MULTIVITAMIN ADULT PO  Take 1 tablet by mouth daily.     naproxen sodium 220 MG tablet  Commonly known as:  ANAPROX  Take 440 mg by mouth daily with breakfast.     nitroGLYCERIN 0.4 MG SL tablet  Commonly known as:  NITROSTAT  Place 1 tablet (0.4 mg total) under the tongue every 5 (five) minutes as needed for chest pain.     NON FORMULARY  Cpap 8cm H2O     rosuvastatin 20 MG tablet  Commonly known as:  CRESTOR  Take 1 tablet (20 mg total) by mouth daily at 6 PM.         Duration of Discharge Encounter: Greater than 30 minutes including physician time.  Jolene ProvostSigned, Naquita Nappier PA-C 10/23/2014 9:20 AM

## 2014-10-23 NOTE — Progress Notes (Signed)
    Subjective:  No CP or dyspnea.  Objective:  Vital Signs in the last 24 hours: Temp:  [97.7 F (36.5 C)-98.1 F (36.7 C)] 97.7 F (36.5 C) (06/01 0703) Pulse Rate:  [0-85] 69 (06/01 0703) Resp:  [0-47] 20 (06/01 0703) BP: (122-167)/(63-100) 162/80 mmHg (06/01 0703) SpO2:  [0 %-99 %] 95 % (06/01 0703) Weight:  [255 lb 11.7 oz (116 kg)] 255 lb 11.7 oz (116 kg) (06/01 0025)  Intake/Output from previous day: 05/31 0701 - 06/01 0700 In: 720 [P.O.:720] Out: -   Physical Exam: Pt is alert and oriented, NAD HEENT: normal Neck: JVP - normal Lungs: CTA bilaterally CV: RRR without murmur or gallop Abd: soft, NT, Positive BS, no hepatomegaly Ext: no C/C/E, distal pulses intact and equal, radial site clear Skin: warm/dry no rash   Lab Results:  Recent Labs  10/22/14 0257 10/23/14 0357  WBC 8.4 6.9  HGB 13.9 13.3  PLT 222 215    Recent Labs  10/22/14 0257 10/23/14 0357  NA 137 137  K 3.3* 4.0  CL 101 103  CO2 27 21*  GLUCOSE 182* 139*  BUN 18 20  CREATININE 1.11 1.02    Recent Labs  10/20/14 1811 10/21/14 0900  TROPONINI 0.61* 0.97*    Cardiac Studies: Cath reviewed  Tele: Sinus rhythm, no AF  Assessment/Plan:  1. NSTEMI: s/p PTCA of the first OM. ASA/plavix x 30 days 2. PAF - symptomatic. CHADS-Vasc = 3 (HTN, DM, CAD). Recommend ASA/plavix x 30 days, then consider a DOAC (Eliquis has been recommended in past but he did not fill Rx). Eval with AF clinic to discuss options - he is interested in rhythm control strategies as he is symptomatic with AF occurrences  Other issues stable and plan to continue current Rx's.   Dennis Bollmanooper, Dennis King, M.D. 10/23/2014, 8:27 AM

## 2014-10-23 NOTE — Progress Notes (Addendum)
CARDIAC REHAB PHASE I   PRE:  Rate/Rhythm: 72 SR    BP: sitting 141/80    SaO2:   MODE:  Ambulation: 1000 ft   POST:  Rate/Rhythm: 81 SR    BP: sitting 145/78     SaO2:   Tolerated well. No Afib. No c/o except slight SOB with talking and walking. Ed completed with good reception. Not interested in CRPII due to distance from his house.  Pt already had Off the Beat book. [id number]  Harriet Massoneeve, Shamonica Schadt Kristan CES, ACSM 10/23/2014 9:20 AM

## 2014-10-23 NOTE — Discharge Instructions (Signed)
Coronary Angioplasty °Coronary angioplasty is a procedure to widen a narrowed or blocked blood vessel of the heart (coronary arteries). The artery is usually blocked by cholesterol buildup (plaque) in the lining or walls. When a vessel in the heart becomes partially blocked, there is decreased blood flow to that area. This may lead to chest pain or a heart attack (myocardial infarction).  °LET YOUR HEALTH CARE PROVIDER KNOW ABOUT: °· Any allergies you have, including allergies to shellfish or contrast dye. °· All medicines you are taking, including vitamins, herbs, eye drops, creams, and over-the-counter medicines. °· Previous problems you or members of your family have had with the use of anesthetics. °· Any blood disorders you have. °· Previous surgeries you have had. °· Any previous kidney problems or failure you have had. °· Medical conditions you have. °· Possibility of pregnancy, if this applies. °RISKS AND COMPLICATIONS °Generally, this is a safe procedure. However, problems can occur and include:  °· Injury to the blood vessels, including rupture or bleeding.   °· Infection, bleeding, or bruising at the catheter site.   °· Allergic reaction to the dye or contrast used.   °· Kidney damage from the dye or contrast used.   °· Blood clots that can lead to a stroke or heart attack.   °· Bleeding into the abdomen (retroperitoneal bleeding). °BEFORE THE PROCEDURE °· Do not eat or drink anything after midnight on the night before the procedure or as directed by your health care provider.  °· Ask your health care provider if you can take a sip of water with any approved medicines the morning of the procedure.   °· Ask your health care provider about changing or stopping your regular medicines. This is especially important if you are taking diabetes medicines or blood thinners. °PROCEDURE °· You may be given a medicine through an IV tube to help you relax (sedative) before and during the procedure.   °· The area where  the catheter will be inserted will be washed and shaved. This is usually done in the groin but may be done in the fold of your arm (near your elbow) or in the wrist.   °· A medicine will be given to numb the area where the catheter will be inserted (local anesthetic).   °· The catheter will be inserted into an artery using a guide wire. The catheter will be guided to the location of the narrowed or blocked artery with a type of X-ray called fluoroscopy.   °· Dye will then be injected and X-rays taken. The dye will help to show where any narrowing or blockages are located.   °· Once positioned at the narrowed or blocked portion of the blood vessel, a balloon will be inflated to make the artery wider. Expanding the balloon will crush the plaque into the wall of the vessel and improve the blood flow.   °· Sometimes the artery may be made wider by removing plaque using a laser or other tools.   °· When the blood flow is better, the balloon will be deflated and the catheter will be removed.   °· After the catheter is removed, a special dressing will be placed over the insertion site. °AFTER THE PROCEDURE °· If the procedure is done through the leg, you will be kept in bed lying flat for 6 hours. You will be told to not bend or cross your legs.   °· The insertion site will be checked often.   °· The pulse in your feet or wrist will be checked often.   °· Additional blood tests, X-rays, and an   electrocardiogram (or electrocardiography) may be done.  Document Released: 05/07/2000 Document Revised: 09/24/2013 Document Reviewed: 01/15/2013 [facility] Patient Information 2015 [place], [place]. This information is not intended to replace advice given to you by your health care provider. Make sure you discuss any questions you have with your health care provider. Atrial Fibrillation Atrial fibrillation is a type of irregular heart rhythm (arrhythmia). During atrial fibrillation, the upper chambers of the heart (atria) quiver  continuously in a chaotic pattern. This causes an irregular and often rapid heart rate.  Atrial fibrillation is the result of the heart becoming overloaded with disorganized signals that tell it to beat. These signals are normally released one at a time by a part of the right atrium called the sinoatrial node. They then travel from the atria to the lower chambers of the heart (ventricles), causing the atria and ventricles to contract and pump blood as they pass. In atrial fibrillation, parts of the atria outside of the sinoatrial node also release these signals. This results in two problems. First, the atria receive so many signals that they do not have time to fully contract. Second, the ventricles, which can only receive one signal at a time, beat irregularly and out of rhythm with the atria.  There are three types of atrial fibrillation:   Paroxysmal. Paroxysmal atrial fibrillation starts suddenly and stops on its own within a week.  Persistent. Persistent atrial fibrillation lasts for more than a week. It may stop on its own or with treatment.  Permanent. Permanent atrial fibrillation does not go away. Episodes of atrial fibrillation may lead to permanent atrial fibrillation. Atrial fibrillation can prevent your heart from pumping blood normally. It increases your risk of stroke and can lead to heart failure.  CAUSES   Heart conditions, including a heart attack, heart failure, coronary artery disease, and heart valve conditions.   Inflammation of the sac that surrounds the heart (pericarditis).  Blockage of an artery in the lungs (pulmonary embolism).  Pneumonia or other infections.  Chronic lung disease.  Thyroid problems, especially if the thyroid is overactive (hyperthyroidism).  Caffeine, excessive alcohol use, and use of some illegal drugs.   Use of some medicines, including certain decongestants and diet pills.  Heart surgery.   Birth defects.  Sometimes, no cause can be  found. When this happens, the atrial fibrillation is called lone atrial fibrillation. The risk of complications from atrial fibrillation increases if you have lone atrial fibrillation and you are age 50 years or older. RISK FACTORS  Heart failure.  Coronary artery disease.  Diabetes mellitus.   High blood pressure (hypertension).   Obesity.   Other arrhythmias.   Increased age. SIGNS AND SYMPTOMS   A feeling that your heart is beating rapidly or irregularly.   A feeling of discomfort or pain in your chest.   Shortness of breath.   Sudden light-headedness or weakness.   Getting tired easily when exercising.   Urinating more often than normal (mainly when atrial fibrillation first begins).  In paroxysmal atrial fibrillation, symptoms may start and suddenly stop. DIAGNOSIS  Your health care provider may be able to detect atrial fibrillation when taking your pulse. Your health care provider may have you take a test called an ambulatory electrocardiogram (ECG). An ECG records your heartbeat patterns over a 24-hour period. You may also have other tests, such as:  Transthoracic echocardiogram (TTE). During echocardiography, sound waves are used to evaluate how blood flows through your heart.  Transesophageal echocardiogram (TEE).  Stress test.  There is more than one type of stress test. If a stress test is needed, ask your health care provider about which type is best for you.  Chest X-ray exam.  Blood tests.  Computed tomography (CT). TREATMENT  Treatment may include:  Treating any underlying conditions. For example, if you have an overactive thyroid, treating the condition may correct atrial fibrillation.  Taking medicine. Medicines may be given to control a rapid heart rate or to prevent blood clots, heart failure, or a stroke.  Having a procedure to correct the rhythm of the heart:  Electrical cardioversion. During electrical cardioversion, a controlled,  low-energy shock is delivered to the heart through your skin. If you have chest pain, very low blood pressure, or sudden heart failure, this procedure may need to be done as an emergency.  Catheter ablation. During this procedure, heart tissues that send the signals that cause atrial fibrillation are destroyed.  Surgical ablation. During this surgery, thin lines of heart tissue that carry the abnormal signals are destroyed. This procedure can either be an open-heart surgery or a minimally invasive surgery. With the minimally invasive surgery, small cuts are made to access the heart instead of a large opening.  Pulmonary venous isolation. During this surgery, tissue around the veins that carry blood from the lungs (pulmonary veins) is destroyed. This tissue is thought to carry the abnormal signals. HOME CARE INSTRUCTIONS   Take medicines only as directed by your health care provider. Some medicines can make atrial fibrillation worse or recur.  If blood thinners were prescribed by your health care provider, take them exactly as directed. Too much blood-thinning medicine can cause bleeding. If you take too little, you will not have the needed protection against stroke and other problems.  Perform blood tests at home if directed by your health care provider. Perform blood tests exactly as directed.  Quit smoking if you smoke.  Do not drink alcohol.  Do not drink caffeinated beverages such as coffee, soda, and some teas. You may drink decaffeinated coffee, soda, or tea.   Maintain a healthy weight.Do not use diet pills unless your health care provider approves. They may make heart problems worse.   Follow diet instructions as directed by your health care provider.  Exercise regularly as directed by your health care provider.  Keep all follow-up visits as directed by your health care provider. This is important. PREVENTION  The following substances can cause atrial fibrillation to recur:     Caffeinated beverages.  Alcohol.  Certain medicines, especially those used for breathing problems.  Certain herbs and herbal medicines, such as those containing ephedra or ginseng.  Illegal drugs, such as cocaine and amphetamines. Sometimes medicines are given to prevent atrial fibrillation from recurring. Proper treatment of any underlying condition is also important in helping prevent recurrence.  SEEK MEDICAL CARE IF:  You notice a change in the rate, rhythm, or strength of your heartbeat.  You suddenly begin urinating more frequently.  You tire more easily when exerting yourself or exercising. SEEK IMMEDIATE MEDICAL CARE IF:   You have chest pain, abdominal pain, sweating, or weakness.  You feel nauseous.  You have shortness of breath.  You suddenly have swollen feet and ankles.  You feel dizzy.  Your face or limbs feel numb or weak.  You have a change in your vision or speech. MAKE SURE YOU:   Understand these instructions.  Will watch your condition.  Will get help right away if you are not doing well  or get worse. Document Released: 05/10/2005 Document Revised: 09/24/2013 Document Reviewed: 06/20/2012 [facility] Patient Information 2015 [place], [place]. This information is not intended to replace advice given to you by your health care provider. Make sure you discuss any questions you have with your health care provider. Radial Site Care Refer to this sheet in the next few weeks. These instructions provide you with information on caring for yourself after your procedure. Your caregiver may also give you more specific instructions. Your treatment has been planned according to current medical practices, but problems sometimes occur. Call your caregiver if you have any problems or questions after your procedure. HOME CARE INSTRUCTIONS  You may shower the day after the procedure.Remove the bandage (dressing) and gently wash the site with plain soap and water.Gently  pat the site dry.  Do not apply powder or lotion to the site.  Do not submerge the affected site in water for 3 to 5 days.  Inspect the site at least twice daily.  Do not flex or bend the affected arm for 24 hours.  No lifting over 5 pounds (2.3 kg) for 5 days after your procedure.  Do not drive home if you are discharged the same day of the procedure. Have someone else drive you.  You may drive 24 hours after the procedure unless otherwise instructed by your caregiver.  Do not operate machinery or power tools for 24 hours.  A responsible adult should be with you for the first 24 hours after you arrive home. What to expect:  Any bruising will usually fade within 1 to 2 weeks.  Blood that collects in the tissue (hematoma) may be painful to the touch. It should usually decrease in size and tenderness within 1 to 2 weeks. SEEK IMMEDIATE MEDICAL CARE IF:  You have unusual pain at the radial site.  You have redness, warmth, swelling, or pain at the radial site.  You have drainage (other than a small amount of blood on the dressing).  You have chills.  You have a fever or persistent symptoms for more than 72 hours.  You have a fever and your symptoms suddenly get worse.  Your arm becomes pale, cool, tingly, or numb.  You have heavy bleeding from the site. Hold pressure on the site. Document Released: 06/12/2010 Document Revised: 08/02/2011 Document Reviewed: 06/12/2010 [facility] Patient Information 2015 [place], [place]. This information is not intended to replace advice given to you by your health care provider. Make sure you discuss any questions you have with your health care provider.

## 2014-10-24 ENCOUNTER — Telehealth: Payer: Self-pay | Admitting: Cardiovascular Disease

## 2014-10-24 NOTE — Telephone Encounter (Signed)
[facility]  Patient understands to follow up with provider Rudi Cocoonna Carroll on 6/16 at 9:30 at [facility]. Patient understands discharge instructions? YES Patient understands medications and regiment? YES Patient understands to bring all medications to this visit? YES  Pt notes feeling much better since leaving hospital.  Patient needs to reschedule HOSP F/U, out of town week of June 16th - he would prefer a visit the week of June 20th - informed him I would route to [place] and [place] at [facility], see if he needs to be followed up there or if he can be [facility] one [facility] w/ extender. Pt acknowledged, requests call to schedule.

## 2014-10-24 NOTE — Telephone Encounter (Signed)
Left message for patient to call for questions, concerns, etc.  F/u 6/16 w Rudi Cocoonna Carroll at [facility].

## 2014-10-24 NOTE — Telephone Encounter (Signed)
Needs a Discharge phone call . Thanks  °

## 2014-10-30 ENCOUNTER — Ambulatory Visit: Payer: Self-pay | Admitting: Cardiovascular Disease

## 2014-11-07 ENCOUNTER — Inpatient Hospital Stay (HOSPITAL_COMMUNITY): Admit: 2014-11-07 | Payer: Self-pay | Admitting: Nurse Practitioner

## 2014-11-11 ENCOUNTER — Encounter: Payer: Self-pay | Admitting: Cardiovascular Disease

## 2014-11-11 ENCOUNTER — Telehealth: Payer: Self-pay | Admitting: Cardiovascular Disease

## 2014-11-21 ENCOUNTER — Telehealth: Payer: Self-pay | Admitting: *Deleted

## 2014-11-21 MED ORDER — CLOPIDOGREL BISULFATE 75 MG PO TABS: 75.0000 mg | ORAL_TABLET | Freq: Every day | ORAL | Status: DC

## 2014-11-22 ENCOUNTER — Ambulatory Visit: Payer: Self-pay | Admitting: Cardiovascular Disease

## 2014-11-22 ENCOUNTER — Ambulatory Visit (INDEPENDENT_AMBULATORY_CARE_PROVIDER_SITE_OTHER): Payer: 59 | Admitting: Cardiovascular Disease

## 2014-11-22 ENCOUNTER — Encounter: Payer: Self-pay | Admitting: Cardiovascular Disease

## 2014-11-22 VITALS — BP 124/72 | HR 68 | Ht 71.0 in | Wt 262.1 lb

## 2014-11-22 DIAGNOSIS — E785 Hyperlipidemia, unspecified: Secondary | ICD-10-CM | POA: Diagnosis not present

## 2014-11-22 DIAGNOSIS — I480 Paroxysmal atrial fibrillation: Secondary | ICD-10-CM

## 2014-11-22 DIAGNOSIS — I4891 Unspecified atrial fibrillation: Secondary | ICD-10-CM | POA: Diagnosis not present

## 2014-11-22 DIAGNOSIS — I2510 Atherosclerotic heart disease of native coronary artery without angina pectoris: Secondary | ICD-10-CM

## 2014-11-22 DIAGNOSIS — I10 Essential (primary) hypertension: Secondary | ICD-10-CM

## 2014-11-22 DIAGNOSIS — G4733 Obstructive sleep apnea (adult) (pediatric): Secondary | ICD-10-CM

## 2014-11-22 DIAGNOSIS — Z9861 Coronary angioplasty status: Secondary | ICD-10-CM

## 2014-11-22 NOTE — Assessment & Plan Note (Signed)
History of symptomatic PAF on beta blocker starting Eliquis oral anticoagulation today. The CHA2DSVASC2 score is  3 . I am going to refer him to Dr. Hillis RangeJames Allred for consideration of A. Fib ablation. He has symptomatic arthritis requiring non-steroidal anti-inflammatory medications which I've warned him about not using well on oral endocrine regulation.

## 2014-11-22 NOTE — Assessment & Plan Note (Signed)
History of hyperlipidemia intolerant to statin drugs. [facility] of using a PC SK9 monoclonal injectable

## 2014-11-22 NOTE — Assessment & Plan Note (Signed)
History of recent non-STEMI with cardiac authorization performed by Dr. Eldridge DaceVaranasi 10/22/14 with cutting balloon angioplasty of the second obtuse marginal branch with an excellent result. A stent was not placed. He was placed on aspirin and Plavix for one month. The Plavix will be discontinued today and Eliquis oral anticoagulation will be begun for paroxysmal atrial fibrillation.

## 2014-11-22 NOTE — Patient Instructions (Addendum)
Medication Instructions:  STOP Plavix START Eliquis 5 mg - take 1 tablet by mouth twice daily.  Labwork: **Please schedule an appointment with Phillips HayKristin Alvstad, PharmD in 2-3 weeks (on a Thursday) in out lipid clinic. Please bring a copy of the blood work, especially your cholesterol, to this appointment!  Follow-Up: Dr Allyson SabalBerry recommends that you schedule a follow-up appointment in 3 months.  Any Other Special Instructions Will Be Listed Below (If Applicable).  Dr Allyson SabalBerry has referred you to Dr Hillis RangeJames Allred for consideration of atrial fibrillation ablation.

## 2014-11-22 NOTE — Assessment & Plan Note (Signed)
History of hypertension blood pressure measured at 124/72. He is on carvedilol, lisinopril and hydrochlorothiazide. Continue current meds at current dosing

## 2014-11-22 NOTE — Assessment & Plan Note (Signed)
History of obstructive sleep apnea on C Pap 

## 2014-11-22 NOTE — Progress Notes (Signed)
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 His primary care physician is Dr. Marinda Elk in [place]. He was referred for evaluation of palpitations and chest pain.  I initially saw him in the office 09/20/14.His cardiac risk factor profile is notable for treated hypertension, diabetes and hyperlipidemia. He does have a family history of heart disease with a father who had his first MI at age 22 and died 10 years later from this. His mother had atrial fibrillation. He does have obstructive sleep apnea C Pap. He noted palpitations last summer which has been occurring on a monthly basis is associated with chest pressure and relative hypotension. He does drink 4 glasses of wine at night and several glasses of whiskey a week. He had a Myoview stress test performed after that that showed normal perfusion with decreased LV function and a 2-D echo that showed EF in the 45% range. He ultimately was admitted with unstable angina by Dr. Antoine Poche  and had positive enzymes. Cardiac catheterization performed by Dr. [name]. revealed a high-grade ostial second obtuse marginal branch stenosis which underwent cutting balloon atherectomy with an excellent angiographic result. An event monitor confirmed A. Fib ablation which he is symptomatic from. Since discharge he has decreased his alcohol intake. We are stopping the Plavix today and initiating oral and a coagulation because his CHA2DSVASC2 score is  3 . I'm also going to refer him to Dr. [name] for consideration of A. Fib ablation. He appears to be statin intolerant we will investigate whether he is a candidate for the PCS K9 monoclonal injectables   Current Outpatient Prescriptions  Medication Sig Dispense Refill  . acetaminophen  (TYLENOL) 325 MG tablet Take 2 tablets (650 mg total) by mouth every 4 (four) hours as needed for headache or mild pain.    . carvedilol (COREG) 25 MG tablet Take 25 mg by mouth 2 (two) times daily with a meal.     . ELIQUIS 5 MG TABS tablet Take 1 tablet by mouth 2 (two) times daily.    . empagliflozin (JARDIANCE) 10 MG TABS tablet Take 10 mg by mouth daily.     Marland Kitchen HUMALOG MIX 75/25 (75-25) 100 UNIT/ML SUSP injection Inject 15-40 Units into the skin 2 (two) times daily with a meal.     . lisinopril-hydrochlorothiazide (PRINZIDE,ZESTORETIC) 20-25 MG per tablet Take 1 tablet by mouth daily.     . metFORMIN (GLUCOPHAGE) 1000 MG tablet Take 2,000 mg by mouth daily.    . Multiple Vitamins-Minerals (MULTIVITAMIN ADULT PO) Take 1 tablet by mouth daily.    . nitroGLYCERIN (NITROSTAT) 0.4 MG SL tablet Place 1 tablet (0.4 mg total) under the tongue every 5 (five) minutes as needed for chest pain. 25 tablet 2  . NON FORMULARY Cpap 8cm H2O    . rosuvastatin (CRESTOR) 20 MG tablet Take 1 tablet (20 mg total) by mouth daily at 6 PM. 30 tablet 11   No current facility-administered medications for this visit.    Allergies  Allergen Reactions  . Statins Other (See Comments)    Specifically Atorvastatin - myalgia  . Tape Rash    Paper tape please.    History   Social History  . Marital Status: Married    Spouse Name: N/A  . Number of  Children: 2  . Years of Education: N/A   Occupational History  . Not on file.   Social History Main Topics  . Smoking status: Never Smoker   . Smokeless tobacco: Not on file  . Alcohol Use: 8.4 - 18.0 oz/week    0 Standard drinks or equivalent, 14-30 Glasses of wine per week  . Drug Use: Not on file  . Sexual Activity: Not on file   Other Topics Concern  . Not on file   Social History Narrative   Retired Administrator, [employer].  Drinks 12 cups of coffee daily.       Review of Systems: General: negative for chills, fever, night sweats or weight changes.  Cardiovascular:  negative for chest pain, dyspnea on exertion, edema, orthopnea, palpitations, paroxysmal nocturnal dyspnea or shortness of breath Dermatological: negative for rash Respiratory: negative for cough or wheezing Urologic: negative for hematuria Abdominal: negative for nausea, vomiting, diarrhea, bright red blood per rectum, melena, or hematemesis Neurologic: negative for visual changes, syncope, or dizziness All other systems reviewed and are otherwise negative except as noted above.    Blood pressure 124/72, pulse 68, height 5\' 11"  (1.803 m), weight 262 lb 1.6 oz (118.888 kg).  General appearance: alert and no distress Neck: no adenopathy, no carotid bruit, no JVD, supple, symmetrical, trachea midline and thyroid not enlarged, symmetric, no tenderness/mass/nodules Lungs: clear to auscultation bilaterally Heart: regular rate and rhythm, S1, S2 normal, no murmur, click, rub or gallop Extremities: extremities normal, atraumatic, no cyanosis or edema  EKG not performed today  ASSESSMENT AND PLAN:   PAF- CHADS VASc= 3 (HTN, DM, CAD).  History of symptomatic PAF on beta blocker starting Eliquis oral anticoagulation today. The CHA2DSVASC2 score is  3 . I am going to refer him to Dr. Hillis RangeJames Allred for consideration of A. Fib ablation. He has symptomatic arthritis requiring non-steroidal anti-inflammatory medications which I've warned him about not using well on oral endocrine regulation.  Obstructive sleep apnea-on C-pap History of obstructive sleep apnea on C Pap  Hyperlipidemia History of hyperlipidemia intolerant to statin drugs. [facility] discuss the possibility of using a PC SK9 monoclonal injectable  Essential hypertension History of hypertension blood pressure measured at 124/72. He is on carvedilol, lisinopril and hydrochlorothiazide. Continue current meds at current dosing  CAD S/P ostial OM2 POBA History of recent non-STEMI with cardiac authorization  performed by Dr. Eldridge DaceVaranasi 10/22/14 with cutting balloon angioplasty of the second obtuse marginal branch with an excellent result. A stent was not placed. He was placed on aspirin and Plavix for one month. The Plavix will be discontinued today and Eliquis oral anticoagulation will be begun for paroxysmal atrial fibrillation.      Runell GessJonathan J. Khalil Szczepanik MD FACP,FACC,FAHA, FSCAI 11/22/2014 12:00 PM

## 2014-11-28 ENCOUNTER — Institutional Professional Consult (permissible substitution): Payer: Self-pay | Admitting: Internal Medicine

## 2014-12-04 ENCOUNTER — Encounter: Payer: Self-pay | Admitting: Cardiovascular Disease

## 2014-12-11 ENCOUNTER — Encounter: Payer: Self-pay | Admitting: *Deleted

## 2014-12-18 ENCOUNTER — Ambulatory Visit (INDEPENDENT_AMBULATORY_CARE_PROVIDER_SITE_OTHER): Payer: 59 | Admitting: Internal Medicine

## 2014-12-18 ENCOUNTER — Encounter: Payer: Self-pay | Admitting: Internal Medicine

## 2014-12-18 VITALS — BP 126/70 | HR 115 | Ht 71.0 in | Wt 265.8 lb

## 2014-12-18 DIAGNOSIS — G4733 Obstructive sleep apnea (adult) (pediatric): Secondary | ICD-10-CM | POA: Diagnosis not present

## 2014-12-18 DIAGNOSIS — I480 Paroxysmal atrial fibrillation: Secondary | ICD-10-CM | POA: Diagnosis not present

## 2014-12-18 DIAGNOSIS — I10 Essential (primary) hypertension: Secondary | ICD-10-CM | POA: Diagnosis not present

## 2014-12-18 NOTE — Patient Instructions (Signed)
Medication Instructions:  Your physician recommends that you continue on your current medications as directed. Please refer to the Current Medication list given to you today.   Labwork: None orderded  Testing/Procedures: Your physician has recommended that you have an ablation. Catheter ablation is a medical procedure used to treat some cardiac arrhythmias (irregular heartbeats). During catheter ablation, a long, thin, flexible tube is put into a blood vessel in your groin (upper thigh), or neck. This tube is called an ablation catheter. It is then guided to your heart through the blood vessel. Radio frequency waves destroy small areas of heart tissue where abnormal heartbeats may cause an arrhythmia to start. Please see the instruction sheet given to you today.    Follow-Up: Your physician recommends that you schedule a follow-up appointment as needed  Call if you decide to proceed with Tikosyn or ablation.  [phone] ask for Dennis Bast, RN   Any Other Special Instructions Will Be Listed Below (If Applicable).

## 2014-12-18 NOTE — Progress Notes (Signed)
Electrophysiology Office Note   Date:  12/18/2014   ID:  Dennis King, DOB 03-Jan-1951, MRN [id number]  PCP:  Lenora Boys, MD  Cardiologist:  Dr Allyson Sabal Primary Electrophysiologist: [name], MD    Chief Complaint  Patient presents with  . Atrial Fibrillation     History of Present Illness: Dennis King is a 64 y.o. male who presents today for electrophysiology evaluation.   Pt reports having atrial fibrillation initially diagnosed last fall after presenting with symptoms of chest pressure and palpitations.  He reports having a sense of "anxiety".  This was associated with a rapid and irregular pulse. Episodes were initially <24 hours and occurred every few weeks.  Episodes began to increase in frequency and duration.  In retrospect, he thinks that this was probably occuring to some degree for at least a year.  He currently feels that he is mostly in sinus though he is in afib today.  He has not required cardioversion. He reports associated exercise intolerance and fatigue.  He was evaluated by Dr Allyson Sabal.  He was initiated on eliquis.  His chronic coreg has been uptitrated for rate control.  He has not tried AAD therapy.   Today, he denies symptoms of palpitations, chest pain, shortness of breath, orthopnea, PND, lower extremity edema, claudication, dizziness, presyncope, syncope, bleeding, or neurologic sequela. The patient is tolerating medications without difficulties and is otherwise without complaint today.    Past Medical History  Diagnosis Date  . Type 2 diabetes mellitus   . Hypertension   . Hyperlipidemia   . Family history of heart disease   . Obstructive sleep apnea     on C Pap  . Obesity   . Persistent atrial fibrillation   . Coronary artery disease     past post circumflex obtuse marginal branch cutting balloon atherectomy by Dr. [name]  . Arthritis    Past Surgical History  Procedure Laterality Date  . Appendectomy    . Cardiac catheterization N/A  10/22/2014    Procedure: Left Heart Cath and Coronary Angiography;  Surgeon: Corky Crafts, MD;  Location: [facility] INVASIVE CV LAB;  Service: Cardiovascular;  Laterality: N/A;  . Cardiac catheterization Right 10/22/2014    Procedure: Coronary Stent Intervention;  Surgeon: Corky Crafts, MD;  Location: [facility] INVASIVE CV LAB;  Service: Cardiovascular;  Laterality: Right;     Current Outpatient Prescriptions  Medication Sig Dispense Refill  . acetaminophen (TYLENOL) 325 MG tablet Take 2 tablets (650 mg total) by mouth every 4 (four) hours as needed for headache or mild pain.    . carvedilol (COREG) 25 MG tablet Take 25 mg by mouth 2 (two) times daily with a meal.     . ELIQUIS 5 MG TABS tablet Take 1 tablet by mouth 2 (two) times daily.    Marland Kitchen HUMALOG MIX 75/25 (75-25) 100 UNIT/ML SUSP injection Inject 15-40 Units into the skin 2 (two) times daily with a meal.     . JARDIANCE 25 MG TABS tablet Take 1 tablet by mouth daily.    Marland Kitchen lisinopril-hydrochlorothiazide (PRINZIDE,ZESTORETIC) 20-25 MG per tablet Take 0.5-1 tablets by mouth daily.     . metFORMIN (GLUCOPHAGE) 1000 MG tablet Take 2,000 mg by mouth daily.    . Multiple Vitamins-Minerals (MULTIVITAMIN ADULT PO) Take 1 tablet by mouth daily.    . nitroGLYCERIN (NITROSTAT) 0.4 MG SL tablet Place 1 tablet (0.4 mg total) under the tongue every 5 (five) minutes as needed for chest pain. 25 tablet 2  .  NON FORMULARY Cpap 8cm H2O     No current facility-administered medications for this visit.    Allergies:   Statins and Tape   Social History:  The patient  reports that he has never smoked. He does not have any smokeless tobacco history on file. He reports that he drinks about 8.4 - 18.0 oz of alcohol per week. He reports that he does not use illicit drugs.   Family History:  The patient's  family history includes Atrial fibrillation in his mother; Heart attack (age of onset: 56) in his father.    ROS:  Please see the history of present illness.    All other systems are reviewed and negative.    PHYSICAL EXAM: VS:  BP 126/70 mmHg  Pulse 115  Ht 5\' 11"  (1.803 m)  Wt 120.566 kg (265 lb 12.8 oz)  BMI 37.09 kg/m2 , BMI Body mass index is 37.09 kg/(m^2). GEN: Well nourished, well developed, in no acute distress HEENT: normal Neck: no JVD, carotid bruits, or masses Cardiac: iRRR; no murmurs, rubs, or gallops,no edema  Respiratory:  clear to auscultation bilaterally, normal work of breathing GI: soft, nontender, nondistended, + BS MS: no deformity or atrophy Skin: warm and dry  Neuro:  Strength and sensation are intact Psych: euthymic mood, full affect  EKG:  EKG is ordered today. The ekg ordered today shows afib with elevated V rates, Qtc <440   Recent Labs: 10/20/2014: Magnesium 2.2; TSH 2.763 10/23/2014: BUN 20; Creatinine, Ser 1.02; Hemoglobin 13.3; Platelets 215; Potassium 4.0; Sodium 137    Lipid Panel  No results found for: CHOL, TRIG, HDL, CHOLHDL, VLDL, LDLCALC, LDLDIRECT   Wt Readings from Last 3 Encounters:  12/18/14 120.566 kg (265 lb 12.8 oz)  11/22/14 118.888 kg (262 lb 1.6 oz)  10/23/14 116 kg (255 lb 11.7 oz)     ASSESSMENT AND PLAN:  1.  paroxysmal atrial fibrillation chads2vasc score is 3. The patient has symptomatic afib.  He has failed medical therapy with coreg.  Therapeutic strategies for afib including medicine and ablation were discussed in detail with the patient today. Risk, benefits, and alternatives to EP study and radiofrequency ablation for afib were also discussed in detail today. These risks include but are not limited to stroke, bleeding, vascular damage, tamponade, perforation, damage to the esophagus, lungs, and other structures, pulmonary vein stenosis, worsening renal function, and death. The patient understands these risk.  I have advised that he consider tikosyn as a medical option.  He is motivated and would like to also consider ablation.  He will think about these two options and  contact our office in the next few days once he is ready to proceed.  2. htn Stable No change required today  3. OSA Compliance with CPAP is advised  4. Obesity Body mass index is 37.09 kg/(m^2). I have reviewed the patients BMI and decreased success rates with ablation at length today.  Weight loss is strongly advised.  Per Guijian et al (PACE 2013; 36: 409-811), patients with BMI 25-29.9 (obese) have a 27% increase in AF recurrence post ablation.  Patients with BMI >30 have a 31% increase in AF recurrence post ablation when compared to those with BMI <25. Cardiofit study results suggesting significant benefit with regular exercise and weight reduction were also discussed at length.  Current medicines are reviewed at length with the patient today.   The patient does not have concerns regarding his medicines.  The following changes were made today:  none  I have not scheduled follow-up as he will contact us if he wishes to proceed. Otherwise, he will follow-up with Dr Allyson Sabal as scheduled.  Randolm Idol, MD  12/18/2014 9:23 AM     [facility] [address] [place] [address] [phone] (office) [phone] (fax)

## 2014-12-19 ENCOUNTER — Ambulatory Visit (INDEPENDENT_AMBULATORY_CARE_PROVIDER_SITE_OTHER): Payer: 59 | Admitting: Pharmacist Clinician (PhC)/ Clinical Pharmacy Specialist

## 2014-12-19 VITALS — Ht 71.0 in | Wt 262.0 lb

## 2014-12-19 DIAGNOSIS — E785 Hyperlipidemia, unspecified: Secondary | ICD-10-CM | POA: Diagnosis not present

## 2014-12-19 NOTE — Patient Instructions (Signed)
Start Livalo at 2 mg daily for 2 weeks (1/2 tablet).  If no muscle problems, increase dose to 4 mg daily.  Will have you repeat cholesterol labs after 12 weeks. (latter part of Octover)  If you can determine which strength of Livalo you were previously on please let me know.  Return for visit after those labs drawn.

## 2014-12-20 ENCOUNTER — Other Ambulatory Visit: Payer: Self-pay | Admitting: Pharmacist Clinician (PhC)/ Clinical Pharmacy Specialist

## 2014-12-20 ENCOUNTER — Encounter: Payer: Self-pay | Admitting: Pharmacist Clinician (PhC)/ Clinical Pharmacy Specialist

## 2014-12-20 MED ORDER — PITAVASTATIN CALCIUM 4 MG PO TABS
4.0000 mg | ORAL_TABLET | Freq: Every day | ORAL | Status: DC
Start: 2014-12-20 — End: 2015-02-03

## 2014-12-20 NOTE — Assessment & Plan Note (Signed)
Reviewed labs with patient today.  Goal LDL is <70. Pt does not recall strength of Livalo that he took, nor does he recall every having tried Zetia.  Gave samples of Livalo 4 mg today, advised that he cut in 1/2 for first 2 weeks then increase to 1 tablet daily.  Will send rx to his pharmacy in order to get prior authorization forms started.  If he can continue to tolerate this, I will have him re-draw labs (at [facility]) in mid-October and see him again shortly therafter.  He is to continue working on weight loss and healthy eating

## 2014-12-20 NOTE — Progress Notes (Signed)
12/20/2014 [name] [birth year] [id number]   HPI:  Dennis King is a 64 y.o. male patient of Dr Allyson Sabal, who presents today for a lipid clinic evaluation.     RF:  HTN, OSA, DM, CAD with balloon angioplasty of 2nd obtuse marginal branch, AF  Meds: none currently    Tried in the past: atorvastatin 20 mg and Crestor 20 mg both caused myalgias of arms and legs; took Livalo previously without problem, but insurance would not cover  Family history:  Father had first MI at 50, died 10 yrs later from MI  Diet: his weight has fluctuated over the years, to a high of 290; currently his wife is trying to keep him on an Atkins like diet with low to no carbs.  Has lost 30 pounds    Exercise:  Nothing formal, retired International aid/development worker with large property, keeps busy  Labs:    All done at [facility];   11/2014:  TC 202, TG 123, HDL 54, LDL 123, LDL-P 1477  Current Outpatient Prescriptions  Medication Sig Dispense Refill  . acetaminophen (TYLENOL) 325 MG tablet Take 2 tablets (650 mg total) by mouth every 4 (four) hours as needed for headache or mild pain.    . carvedilol (COREG) 25 MG tablet Take 25 mg by mouth 2 (two) times daily with a meal.     . ELIQUIS 5 MG TABS tablet Take 1 tablet by mouth 2 (two) times daily.    Marland Kitchen HUMALOG MIX 75/25 (75-25) 100 UNIT/ML SUSP injection Inject 15-40 Units into the skin 2 (two) times daily with a meal.     . JARDIANCE 25 MG TABS tablet Take 1 tablet by mouth daily.    Marland Kitchen lisinopril-hydrochlorothiazide (PRINZIDE,ZESTORETIC) 20-25 MG per tablet Take 0.5-1 tablets by mouth daily.     . metFORMIN (GLUCOPHAGE) 1000 MG tablet Take 2,000 mg by mouth daily.    . Multiple Vitamins-Minerals (MULTIVITAMIN ADULT PO) Take 1 tablet by mouth daily.    . nitroGLYCERIN (NITROSTAT) 0.4 MG SL tablet Place 1 tablet (0.4 mg total) under the tongue every 5 (five) minutes as needed for chest pain. 25 tablet 2  . NON FORMULARY Cpap 8cm H2O     No current facility-administered medications  for this visit.    Allergies  Allergen Reactions  . Statins Other (See Comments)    Specifically Atorvastatin and Crestoe - myalgia  . Tape Rash    Paper tape please.    Past Medical History  Diagnosis Date  . Type 2 diabetes mellitus   . Hypertension   . Hyperlipidemia   . Family history of heart disease   . Obstructive sleep apnea     on C Pap  . Obesity   . Paroxysmal a-fib   . Coronary artery disease     past post circumflex obtuse marginal branch cutting balloon atherectomy by Dr. [name]  . Arthritis     Height  (1.803 m), weight 262 lb (118.842 kg).   Phillips Hay PharmD CPP [facility]

## 2014-12-23 ENCOUNTER — Telehealth: Payer: Self-pay | Admitting: Internal Medicine

## 2014-12-23 NOTE — Telephone Encounter (Signed)
New Messagel   Pt called back to discuss ablation. Please call

## 2014-12-31 ENCOUNTER — Encounter: Payer: Self-pay | Admitting: Cardiovascular Disease

## 2015-01-07 ENCOUNTER — Telehealth: Payer: Self-pay | Admitting: Cardiovascular Disease

## 2015-01-07 NOTE — Telephone Encounter (Signed)
Prior authorization for livalo submitted via [email or web address]

## 2015-01-07 NOTE — Telephone Encounter (Signed)
Did we receive the prior authorization for Livalo 4 mf rom Cover My Meds yesterday and if so what is the status.

## 2015-01-09 ENCOUNTER — Encounter: Payer: Self-pay | Admitting: *Deleted

## 2015-01-09 ENCOUNTER — Other Ambulatory Visit: Payer: Self-pay | Admitting: *Deleted

## 2015-01-09 DIAGNOSIS — I480 Paroxysmal atrial fibrillation: Secondary | ICD-10-CM

## 2015-01-09 NOTE — Telephone Encounter (Signed)
Labs sch for 01/20/15 at 9:00am.  TEE sch for 01/30/15 at 9:00am check in at 7:30am and ablation is scheduled for 01/31/15 at 7:30am   Check in at 5:30am

## 2015-01-15 NOTE — Telephone Encounter (Signed)
PA was denied.  I deferred to [place].

## 2015-01-17 ENCOUNTER — Telehealth: Payer: Self-pay | Admitting: Pharmacist Clinician (PhC)/ Clinical Pharmacy Specialist

## 2015-01-17 MED ORDER — PRAVASTATIN SODIUM 20 MG PO TABS: 20.0000 mg | ORAL_TABLET | Freq: Every evening | ORAL | Status: DC

## 2015-01-17 NOTE — Telephone Encounter (Signed)
Livalo denied by insurance until failure of pravastatin, lovastatin or fluvastatin.  Will start him on pravastatin 20 mg, spoke with wife, she is aware of denial letter.

## 2015-01-20 ENCOUNTER — Other Ambulatory Visit (INDEPENDENT_AMBULATORY_CARE_PROVIDER_SITE_OTHER): Payer: 59 | Admitting: *Deleted

## 2015-01-20 DIAGNOSIS — I10 Essential (primary) hypertension: Secondary | ICD-10-CM | POA: Diagnosis not present

## 2015-01-20 DIAGNOSIS — I420 Dilated cardiomyopathy: Secondary | ICD-10-CM

## 2015-01-20 DIAGNOSIS — E785 Hyperlipidemia, unspecified: Secondary | ICD-10-CM

## 2015-01-20 LAB — CBC WITH DIFFERENTIAL/PLATELET
BASOS ABS: 0 10*3/uL (ref 0.0–0.1)
Basophils Relative: 0.7 % (ref 0.0–3.0)
EOS ABS: 0.2 10*3/uL (ref 0.0–0.7)
Eosinophils Relative: 3.4 % (ref 0.0–5.0)
HEMATOCRIT: 43.2 % (ref 39.0–52.0)
Hemoglobin: 14.6 g/dL (ref 13.0–17.0)
LYMPHS PCT: 37 % (ref 12.0–46.0)
Lymphs Abs: 2.7 10*3/uL (ref 0.7–4.0)
MCHC: 33.8 g/dL (ref 30.0–36.0)
MCV: 93.3 fl (ref 78.0–100.0)
Monocytes Absolute: 0.6 10*3/uL (ref 0.1–1.0)
Monocytes Relative: 8.5 % (ref 3.0–12.0)
NEUTROS ABS: 3.6 10*3/uL (ref 1.4–7.7)
NEUTROS PCT: 50.4 % (ref 43.0–77.0)
PLATELETS: 210 10*3/uL (ref 150.0–400.0)
RBC: 4.63 Mil/uL (ref 4.22–5.81)
RDW: 13.5 % (ref 11.5–15.5)
WBC: 7.2 10*3/uL (ref 4.0–10.5)

## 2015-01-20 LAB — BASIC METABOLIC PANEL
BUN: 28 mg/dL — ABNORMAL HIGH (ref 6–23)
CALCIUM: 9.5 mg/dL (ref 8.4–10.5)
CO2: 26 meq/L (ref 19–32)
CREATININE: 1.03 mg/dL (ref 0.40–1.50)
Chloride: 100 mEq/L (ref 96–112)
GFR: 77.56 mL/min (ref >60.00)
Glucose, Bld: 189 mg/dL — ABNORMAL HIGH (ref 70–99)
Potassium: 4.1 mEq/L (ref 3.5–5.1)
Sodium: 137 mEq/L (ref 135–145)

## 2015-01-24 ENCOUNTER — Telehealth: Payer: Self-pay | Admitting: Cardiovascular Disease

## 2015-01-24 NOTE — Telephone Encounter (Signed)
LMTCB 9/2

## 2015-01-24 NOTE — Telephone Encounter (Signed)
New Message  Pt called to discuss which medications he can or cannot take before his TEE and an ablation scheduled on the 8th and 9th of September. Please call back to discuss.

## 2015-01-29 NOTE — Telephone Encounter (Signed)
LM FOR PT TO CALL BACK  WILL FORWARD TO [place]  ADDRESS./CY

## 2015-01-29 NOTE — Telephone Encounter (Signed)
NPO after midnight and no medications the morning of the procedures.  I have lmom for him and to call back if needed

## 2015-01-30 ENCOUNTER — Ambulatory Visit (HOSPITAL_COMMUNITY)
Admission: RE | Admit: 2015-01-30 | Discharge: 2015-01-30 | Disposition: A | Discharge status: Home / Self Care | Payer: 59 | Source: Ambulatory Visit | Attending: Cardiovascular Disease | Admitting: Cardiovascular Disease

## 2015-01-30 ENCOUNTER — Encounter (HOSPITAL_COMMUNITY): Payer: Self-pay | Admitting: *Deleted

## 2015-01-30 ENCOUNTER — Encounter (HOSPITAL_COMMUNITY)
Admission: RE | Disposition: A | Discharge status: Home / Self Care | Payer: Self-pay | Source: Ambulatory Visit | Attending: Cardiovascular Disease

## 2015-01-30 ENCOUNTER — Ambulatory Visit (HOSPITAL_BASED_OUTPATIENT_CLINIC_OR_DEPARTMENT_OTHER): Payer: 59

## 2015-01-30 DIAGNOSIS — I25110 Atherosclerotic heart disease of native coronary artery with unstable angina pectoris: Secondary | ICD-10-CM | POA: Insufficient documentation

## 2015-01-30 DIAGNOSIS — E119 Type 2 diabetes mellitus without complications: Secondary | ICD-10-CM | POA: Diagnosis not present

## 2015-01-30 DIAGNOSIS — I4891 Unspecified atrial fibrillation: Secondary | ICD-10-CM

## 2015-01-30 DIAGNOSIS — Q211 Atrial septal defect: Secondary | ICD-10-CM | POA: Diagnosis not present

## 2015-01-30 DIAGNOSIS — E785 Hyperlipidemia, unspecified: Secondary | ICD-10-CM | POA: Insufficient documentation

## 2015-01-30 DIAGNOSIS — I252 Old myocardial infarction: Secondary | ICD-10-CM | POA: Insufficient documentation

## 2015-01-30 DIAGNOSIS — G4733 Obstructive sleep apnea (adult) (pediatric): Secondary | ICD-10-CM | POA: Insufficient documentation

## 2015-01-30 DIAGNOSIS — Z7901 Long term (current) use of anticoagulants: Secondary | ICD-10-CM | POA: Insufficient documentation

## 2015-01-30 DIAGNOSIS — Z794 Long term (current) use of insulin: Secondary | ICD-10-CM | POA: Diagnosis not present

## 2015-01-30 DIAGNOSIS — Z8249 Family history of ischemic heart disease and other diseases of the circulatory system: Secondary | ICD-10-CM | POA: Insufficient documentation

## 2015-01-30 DIAGNOSIS — Z79899 Other long term (current) drug therapy: Secondary | ICD-10-CM | POA: Insufficient documentation

## 2015-01-30 DIAGNOSIS — I480 Paroxysmal atrial fibrillation: Secondary | ICD-10-CM | POA: Diagnosis present

## 2015-01-30 DIAGNOSIS — I10 Essential (primary) hypertension: Secondary | ICD-10-CM | POA: Diagnosis not present

## 2015-01-30 HISTORY — PX: TEE WITHOUT CARDIOVERSION: SHX5443

## 2015-01-30 LAB — GLUCOSE, CAPILLARY: GLUCOSE-CAPILLARY: 144 mg/dL — AB (ref 65–99)

## 2015-01-30 SURGERY — ECHOCARDIOGRAM, TRANSESOPHAGEAL
Anesthesia: Moderate Sedation

## 2015-01-30 MED ORDER — FENTANYL CITRATE (PF) 100 MCG/2ML IJ SOLN
INTRAMUSCULAR | Status: AC
  Filled 2015-01-30: qty 2

## 2015-01-30 MED ORDER — SODIUM CHLORIDE 0.9 % IV SOLN
INTRAVENOUS | Status: DC
  Administered 2015-01-30: 500 mL via INTRAVENOUS

## 2015-01-30 MED ORDER — MIDAZOLAM HCL 5 MG/ML IJ SOLN
INTRAMUSCULAR | Status: AC
  Filled 2015-01-30: qty 2

## 2015-01-30 MED ORDER — MIDAZOLAM HCL 10 MG/2ML IJ SOLN
INTRAMUSCULAR | Status: DC | PRN
  Administered 2015-01-30 (×2): 2 mg via INTRAVENOUS

## 2015-01-30 MED ORDER — FENTANYL CITRATE (PF) 100 MCG/2ML IJ SOLN
INTRAMUSCULAR | Status: DC | PRN
  Administered 2015-01-30 (×2): 25 ug via INTRAVENOUS

## 2015-01-30 NOTE — Progress Notes (Signed)
  Echocardiogram Echocardiogram Transesophageal has been performed.  Dennis King 01/30/2015, 9:51 AM

## 2015-01-30 NOTE — Discharge Instructions (Signed)
Esophagogastroduodenoscopy °Care After °Refer to this sheet in the next few weeks. These instructions provide you with information on caring for yourself after your procedure. Your caregiver may also give you more specific instructions. Your treatment has been planned according to current medical practices, but problems sometimes occur. Call your caregiver if you have any problems or questions after your procedure.  °HOME CARE INSTRUCTIONS °· Do not eat or drink anything until the numbing medicine (local anesthetic) has worn off and your gag reflex has returned. You will know that the local anesthetic has worn off when you can swallow comfortably. °· Do not drive for 12 hours after the procedure or as directed by your caregiver. °· Only take medicines as directed by your caregiver. °SEEK MEDICAL CARE IF:  °· You cannot stop coughing. °· You are not urinating at all or less than usual. °SEEK IMMEDIATE MEDICAL CARE IF: °· You have difficulty swallowing. °· You cannot eat or drink. °· You have worsening throat or chest pain. °· You have dizziness, lightheadedness, or you faint. °· You have nausea or vomiting. °· You have chills. °· You have a fever. °· You have severe abdominal pain. °· You have black, tarry, or bloody stools. °Document Released: 04/26/2012 Document Reviewed: 04/26/2012 °ExitCare® Patient Information ©2015 [employer]. This information is not intended to replace advice given to you by your health care provider. Make sure you discuss any questions you have with your health care provider. ° °

## 2015-01-30 NOTE — H&P (Signed)
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 His primary care physician is Dr. Marinda Elk in [place]. He was referred for evaluation of palpitations and chest pain. I initially saw him in the office 09/20/14.His cardiac risk factor profile is notable for treated hypertension, diabetes and hyperlipidemia. He does have a family history of heart disease with a father who had his first MI at age 5 and died 10 years later from this. His mother had atrial fibrillation. He does have obstructive sleep apnea C Pap. He noted palpitations last summer which has been occurring on a monthly basis is associated with chest pressure and relative hypotension. He does drink 4 glasses of wine at night and several glasses of whiskey a week. He had a Myoview stress test performed after that that showed normal perfusion with decreased LV function and a 2-D echo that showed EF in the 45% range. He ultimately was admitted with unstable angina by Dr. [place] Poche and had positive enzymes. Cardiac catheterization performed by Dr. [name]. revealed a high-grade ostial second obtuse marginal branch stenosis which underwent cutting balloon atherectomy with an excellent angiographic result. An event monitor confirmed A. Fib ablation which he is symptomatic from. Since discharge he has decreased his alcohol intake. We are stopping the Plavix today and initiating oral and a coagulation because his CHA2DSVASC2 score is 3 . I'm also going to refer him to Dr. [name] for consideration of A. Fib ablation. He appears to be statin intolerant we will investigate whether he is a candidate for the PCS K9 monoclonal injectables  Admitted as outpatient for TEE prior to Afib Ablation with Dr [name] in AM   Current Outpatient Prescriptions  Medication Sig Dispense Refill   . acetaminophen (TYLENOL) 325 MG tablet Take 2 tablets (650 mg total) by mouth every 4 (four) hours as needed for headache or mild pain.    . carvedilol (COREG) 25 MG tablet Take 25 mg by mouth 2 (two) times daily with a meal.     . ELIQUIS 5 MG TABS tablet Take 1 tablet by mouth 2 (two) times daily.    . empagliflozin (JARDIANCE) 10 MG TABS tablet Take 10 mg by mouth daily.     Marland Kitchen HUMALOG MIX 75/25 (75-25) 100 UNIT/ML SUSP injection Inject 15-40 Units into the skin 2 (two) times daily with a meal.     . lisinopril-hydrochlorothiazide (PRINZIDE,ZESTORETIC) 20-25 MG per tablet Take 1 tablet by mouth daily.     . metFORMIN (GLUCOPHAGE) 1000 MG tablet Take 2,000 mg by mouth daily.    . Multiple Vitamins-Minerals (MULTIVITAMIN ADULT PO) Take 1 tablet by mouth daily.    . nitroGLYCERIN (NITROSTAT) 0.4 MG SL tablet Place 1 tablet (0.4 mg total) under the tongue every 5 (five) minutes as needed for chest pain. 25 tablet 2  . NON FORMULARY Cpap 8cm H2O    . rosuvastatin (CRESTOR) 20 MG tablet Take 1 tablet (20 mg total) by mouth daily at 6 PM. 30 tablet 11   No current facility-administered medications for this visit.    Allergies  Allergen Reactions  . Statins Other (See Comments)    Specifically Atorvastatin - myalgia  . Tape Rash    Paper tape please.    History   Social History  . Marital Status: Married    Spouse Name: N/A  . Number of Children:  2  . Years of Education: N/A   Occupational History  . Not on file.   Social History Main Topics  . Smoking status: Never Smoker   . Smokeless tobacco: Not on file  . Alcohol Use: 8.4 - 18.0 oz/week    0 Standard drinks or equivalent, 14-30 Glasses of wine per week  . Drug Use: Not on file  . Sexual Activity: Not on file   Other Topics Concern  . Not on file   Social History Narrative   Retired Administrator, [employer].  Drinks 12 cups of coffee daily.      Review of Systems: General: negative for chills, fever, night sweats or weight changes.  Cardiovascular: negative for chest pain, dyspnea on exertion, edema, orthopnea, palpitations, paroxysmal nocturnal dyspnea or shortness of breath Dermatological: negative for rash Respiratory: negative for cough or wheezing Urologic: negative for hematuria Abdominal: negative for nausea, vomiting, diarrhea, bright red blood per rectum, melena, or hematemesis Neurologic: negative for visual changes, syncope, or dizziness All other systems reviewed and are otherwise negative except as noted above.    Blood pressure 124/72, pulse 68, height 5\' 11"  (1.803 m), weight 262 lb 1.6 oz (118.888 kg).  General appearance: alert and no distress Neck: no adenopathy, no carotid bruit, no JVD, supple, symmetrical, trachea midline and thyroid not enlarged, symmetric, no tenderness/mass/nodules Lungs: clear to auscultation bilaterally Heart: regular rate and rhythm, S1, S2 normal, no murmur, click, rub or gallop Extremities: extremities normal, atraumatic, no cyanosis or edema  EKG not performed today  ASSESSMENT AND PLAN:   PAF- CHADS VASc= 3 (HTN, DM, CAD).  On Eliquis.  TEE today Afib Ablation with Dr [name] in AM  Obstructive sleep apnea-on C-pap History of obstructive sleep apnea on C Pap  Hyperlipidemia History of hyperlipidemia intolerant to statin drugs. I am going to refer him to [facility] to discuss the possibility of using a PC SK9 monoclonal injectable  Essential hypertension History of hypertension blood pressure measured at 124/72. He is on carvedilol, lisinopril and hydrochlorothiazide. Continue current meds at current dosing  CAD S/P ostial OM2 POBA History of recent non-STEMI with cardiac authorization performed by Dr. [name] 10/22/14 with cutting balloon angioplasty of the second obtuse marginal branch with an excellent result. A stent was not placed.  He was placed on aspirin and Plavix for one month. The Plavix will be discontinued today and Eliquis oral anticoagulation will be begun for paroxysmal atrial fibrillation.

## 2015-01-30 NOTE — CV Procedure (Signed)
TEE:  5 mg versed 50 ug fentanyl  Mild LAE No LAA thrombus Lipomatous hypertrophy atrial septum Small PFO with positive bubble study Normal AV Mild MR Normal RV EF 55% No significant aortic debris  Charlton Haws

## 2015-01-31 ENCOUNTER — Ambulatory Visit (HOSPITAL_COMMUNITY): Payer: 59 | Admitting: Anesthesiology

## 2015-01-31 ENCOUNTER — Encounter (HOSPITAL_COMMUNITY)
Admission: RE | Disposition: A | Discharge status: Home / Self Care | Payer: Self-pay | Source: Ambulatory Visit | Attending: Internal Medicine

## 2015-01-31 ENCOUNTER — Ambulatory Visit (HOSPITAL_COMMUNITY)
Admission: RE | Admit: 2015-01-31 | Discharge: 2015-02-01 | Disposition: A | Discharge status: Home / Self Care | Payer: 59 | Source: Ambulatory Visit | Attending: Internal Medicine | Admitting: Internal Medicine

## 2015-01-31 ENCOUNTER — Encounter (HOSPITAL_COMMUNITY): Payer: Self-pay | Admitting: Anesthesiology

## 2015-01-31 DIAGNOSIS — E785 Hyperlipidemia, unspecified: Secondary | ICD-10-CM | POA: Diagnosis not present

## 2015-01-31 DIAGNOSIS — I4892 Unspecified atrial flutter: Secondary | ICD-10-CM

## 2015-01-31 DIAGNOSIS — E669 Obesity, unspecified: Secondary | ICD-10-CM | POA: Diagnosis not present

## 2015-01-31 DIAGNOSIS — G4733 Obstructive sleep apnea (adult) (pediatric): Secondary | ICD-10-CM | POA: Diagnosis not present

## 2015-01-31 DIAGNOSIS — Z955 Presence of coronary angioplasty implant and graft: Secondary | ICD-10-CM | POA: Insufficient documentation

## 2015-01-31 DIAGNOSIS — I10 Essential (primary) hypertension: Secondary | ICD-10-CM | POA: Insufficient documentation

## 2015-01-31 DIAGNOSIS — I2510 Atherosclerotic heart disease of native coronary artery without angina pectoris: Secondary | ICD-10-CM | POA: Diagnosis not present

## 2015-01-31 DIAGNOSIS — I480 Paroxysmal atrial fibrillation: Secondary | ICD-10-CM | POA: Diagnosis not present

## 2015-01-31 DIAGNOSIS — Z6836 Body mass index (BMI) 36.0-36.9, adult: Secondary | ICD-10-CM | POA: Insufficient documentation

## 2015-01-31 DIAGNOSIS — E119 Type 2 diabetes mellitus without complications: Secondary | ICD-10-CM | POA: Insufficient documentation

## 2015-01-31 DIAGNOSIS — I4891 Unspecified atrial fibrillation: Secondary | ICD-10-CM | POA: Diagnosis present

## 2015-01-31 HISTORY — PX: ELECTROPHYSIOLOGIC STUDY: SHX172A

## 2015-01-31 LAB — GLUCOSE, CAPILLARY
GLUCOSE-CAPILLARY: 169 mg/dL — AB (ref 65–99)
GLUCOSE-CAPILLARY: 160 mg/dL — AB (ref 65–99)
GLUCOSE-CAPILLARY: 178 mg/dL — AB (ref 65–99)
GLUCOSE-CAPILLARY: 178 mg/dL — AB (ref 65–99)

## 2015-01-31 LAB — POCT ACTIVATED CLOTTING TIME
ACTIVATED CLOTTING TIME: 282 s
ACTIVATED CLOTTING TIME: 282 s
ACTIVATED CLOTTING TIME: 325 s
ACTIVATED CLOTTING TIME: 128 s

## 2015-01-31 LAB — MRSA PCR SCREENING: MRSA by PCR: NEGATIVE

## 2015-01-31 SURGERY — ATRIAL FIBRILLATION ABLATION
Anesthesia: General

## 2015-01-31 MED ORDER — HEPARIN SODIUM (PORCINE) 1000 UNIT/ML IJ SOLN
INTRAMUSCULAR | Status: AC
  Filled 2015-01-31: qty 1

## 2015-01-31 MED ORDER — PHENYLEPHRINE HCL 10 MG/ML IJ SOLN
INTRAMUSCULAR | Status: DC | PRN
  Administered 2015-01-31 (×6): 40 ug via INTRAVENOUS

## 2015-01-31 MED ORDER — EMPAGLIFLOZIN 25 MG PO TABS: 25.0000 mg | ORAL_TABLET | Freq: Every day | ORAL | Status: DC

## 2015-01-31 MED ORDER — IOHEXOL 350 MG/ML SOLN
INTRAVENOUS | Status: DC | PRN
  Administered 2015-01-31: 100 mL via INTRAVENOUS

## 2015-01-31 MED ORDER — ONDANSETRON HCL 4 MG/2ML IJ SOLN
INTRAMUSCULAR | Status: DC | PRN
  Administered 2015-01-31: 4 mg via INTRAVENOUS

## 2015-01-31 MED ORDER — LIDOCAINE HCL (CARDIAC) 20 MG/ML IV SOLN
INTRAVENOUS | Status: DC | PRN
  Administered 2015-01-31: 100 mg via INTRAVENOUS

## 2015-01-31 MED ORDER — PROMETHAZINE HCL 25 MG/ML IJ SOLN: 6.2500 mg | INTRAMUSCULAR | Status: DC | PRN

## 2015-01-31 MED ORDER — BUPIVACAINE HCL (PF) 0.25 % IJ SOLN
INTRAMUSCULAR | Status: AC
  Filled 2015-01-31: qty 30

## 2015-01-31 MED ORDER — LISINOPRIL-HYDROCHLOROTHIAZIDE 20-25 MG PO TABS: 0.5000 | ORAL_TABLET | Freq: Every day | ORAL | Status: DC

## 2015-01-31 MED ORDER — SODIUM CHLORIDE 0.9 % IJ SOLN
3.0000 mL | Freq: Two times a day (BID) | INTRAMUSCULAR | Status: DC
  Administered 2015-01-31 – 2015-02-01 (×3): 3 mL via INTRAVENOUS

## 2015-01-31 MED ORDER — SODIUM CHLORIDE 0.9 % IJ SOLN: 3.0000 mL | INTRAMUSCULAR | Status: DC | PRN

## 2015-01-31 MED ORDER — SODIUM CHLORIDE 0.9 % IV SOLN
INTRAVENOUS | Status: DC
  Administered 2015-01-31 (×2): via INTRAVENOUS

## 2015-01-31 MED ORDER — HYDROCODONE-ACETAMINOPHEN 5-325 MG PO TABS
1.0000 | ORAL_TABLET | ORAL | Status: DC | PRN
  Administered 2015-02-01 (×2): 1 via ORAL
  Filled 2015-01-31 (×2): qty 1

## 2015-01-31 MED ORDER — ACETAMINOPHEN 325 MG PO TABS
650.0000 mg | ORAL_TABLET | ORAL | Status: DC | PRN
  Administered 2015-01-31: 650 mg via ORAL
  Filled 2015-01-31: qty 2

## 2015-01-31 MED ORDER — ONDANSETRON HCL 4 MG/2ML IJ SOLN: 4.0000 mg | Freq: Four times a day (QID) | INTRAMUSCULAR | Status: DC | PRN

## 2015-01-31 MED ORDER — HYDROCHLOROTHIAZIDE 25 MG PO TABS
25.0000 mg | ORAL_TABLET | Freq: Every day | ORAL | Status: DC
  Administered 2015-02-01: 25 mg via ORAL
  Filled 2015-01-31: qty 1

## 2015-01-31 MED ORDER — CARVEDILOL 25 MG PO TABS
25.0000 mg | ORAL_TABLET | Freq: Two times a day (BID) | ORAL | Status: DC
  Administered 2015-02-01: 25 mg via ORAL
  Filled 2015-01-31: qty 1

## 2015-01-31 MED ORDER — PROPOFOL 10 MG/ML IV BOLUS
INTRAVENOUS | Status: DC | PRN
  Administered 2015-01-31: 190 mg via INTRAVENOUS

## 2015-01-31 MED ORDER — SODIUM CHLORIDE 0.9 % IV SOLN: 250.0000 mL | INTRAVENOUS | Status: DC | PRN

## 2015-01-31 MED ORDER — PROTAMINE SULFATE 10 MG/ML IV SOLN
INTRAVENOUS | Status: DC | PRN
  Administered 2015-01-31 (×4): 10 mg via INTRAVENOUS

## 2015-01-31 MED ORDER — FENTANYL CITRATE (PF) 100 MCG/2ML IJ SOLN: 25.0000 ug | INTRAMUSCULAR | Status: DC | PRN

## 2015-01-31 MED ORDER — MIDAZOLAM HCL 5 MG/5ML IJ SOLN
INTRAMUSCULAR | Status: DC | PRN
  Administered 2015-01-31: 2 mg via INTRAVENOUS

## 2015-01-31 MED ORDER — BUPIVACAINE HCL (PF) 0.25 % IJ SOLN
INTRAMUSCULAR | Status: DC | PRN
  Administered 2015-01-31: 20 mL

## 2015-01-31 MED ORDER — FENTANYL CITRATE (PF) 100 MCG/2ML IJ SOLN
INTRAMUSCULAR | Status: DC | PRN
  Administered 2015-01-31: 50 ug via INTRAVENOUS
  Administered 2015-01-31: 25 ug via INTRAVENOUS
  Administered 2015-01-31: 50 ug via INTRAVENOUS
  Administered 2015-01-31 (×5): 25 ug via INTRAVENOUS

## 2015-01-31 MED ORDER — HEPARIN SODIUM (PORCINE) 1000 UNIT/ML IJ SOLN
INTRAMUSCULAR | Status: DC | PRN
  Administered 2015-01-31: 15000 [IU] via INTRAVENOUS
  Administered 2015-01-31: 3000 [IU] via INTRAVENOUS
  Administered 2015-01-31: 5000 [IU] via INTRAVENOUS

## 2015-01-31 MED ORDER — APIXABAN 5 MG PO TABS
5.0000 mg | ORAL_TABLET | Freq: Two times a day (BID) | ORAL | Status: DC
  Administered 2015-01-31 – 2015-02-01 (×2): 5 mg via ORAL
  Filled 2015-01-31 (×2): qty 1

## 2015-01-31 MED ORDER — LISINOPRIL 20 MG PO TABS
20.0000 mg | ORAL_TABLET | Freq: Every day | ORAL | Status: DC
  Administered 2015-02-01: 20 mg via ORAL
  Filled 2015-01-31: qty 1

## 2015-01-31 SURGICAL SUPPLY — 21 items
BAG SNAP BAND KOVER 36X36 (MISCELLANEOUS) ×2 IMPLANT
BLANKET WARM UNDERBOD FULL ACC (MISCELLANEOUS) ×2 IMPLANT
CATH DIAG 6FR PIGTAIL (CATHETERS) ×2 IMPLANT
CATH NAVISTAR SMARTTOUCH DF (ABLATOR) ×2 IMPLANT
CATH SOUNDSTAR 3D IMAGING (CATHETERS) ×2 IMPLANT
CATH VARIABLE LASSO NAV 2515 (CATHETERS) ×2 IMPLANT
CATH WEBSTER BI DIR CS D-F CRV (CATHETERS) ×2 IMPLANT
COVER SWIFTLINK CONNECTOR (BAG) ×2 IMPLANT
NEEDLE TRANSEP BRK 71CM 407200 (NEEDLE) ×2 IMPLANT
PACK EP LATEX FREE (CUSTOM PROCEDURE TRAY) ×1
PACK EP LF (CUSTOM PROCEDURE TRAY) ×1 IMPLANT
PAD DEFIB LIFELINK (PAD) ×2 IMPLANT
PATCH CARTO3 (PAD) ×2 IMPLANT
SHEATH AVANTI 11F 11CM (SHEATH) ×2 IMPLANT
SHEATH PINNACLE 7F 10CM (SHEATH) ×4 IMPLANT
SHEATH PINNACLE 9F 10CM (SHEATH) ×2 IMPLANT
SHEATH SWARTZ TS SL2 63CM 8.5F (SHEATH) ×2 IMPLANT
SHIELD RADPAD SCOOP 12X17 (MISCELLANEOUS) ×2 IMPLANT
SYR MEDRAD MARK V 150ML (SYRINGE) ×2 IMPLANT
TUBING CONTRAST HIGH PRESS 48 (TUBING) ×2 IMPLANT
TUBING SMART ABLATE COOLFLOW (TUBING) ×2 IMPLANT

## 2015-01-31 NOTE — Anesthesia Procedure Notes (Signed)
Procedure Name: LMA Insertion Date/Time: 01/31/2015 7:56 AM Performed by: Romie Minus K Pre-anesthesia Checklist: Patient identified, Emergency Drugs available, Suction available, Patient being monitored and Timeout performed Patient Re-evaluated:Patient Re-evaluated prior to inductionOxygen Delivery Method: Circle system utilized Preoxygenation: Pre-oxygenation with 100% oxygen Intubation Type: IV induction LMA: LMA inserted LMA Size: 5.0 Number of attempts: 1 Placement Confirmation: positive ETCO2,  CO2 detector and breath sounds checked- equal and bilateral Tube secured with: Tape Dental Injury: Teeth and Oropharynx as per pre-operative assessment

## 2015-01-31 NOTE — Anesthesia Postprocedure Evaluation (Signed)
  Anesthesia Post-op Note  Patient: Dennis King  Procedure(s) Performed: Procedure(s) (LRB): Atrial Fibrillation Ablation (N/A)  Patient Location: PACU  Anesthesia Type: General  Level of Consciousness: awake and alert   Airway and Oxygen Therapy: Patient Spontanous Breathing  Post-op Pain: mild  Post-op Assessment: Post-op Vital signs reviewed, Patient's Cardiovascular Status Stable, Respiratory Function Stable, Patent Airway and No signs of Nausea or vomiting  Last Vitals:  Filed Vitals:   01/31/15 1240  BP: 164/50  Pulse: 82  Temp:   Resp: 33    Post-op Vital Signs: stable   Complications: No apparent anesthesia complications

## 2015-01-31 NOTE — Progress Notes (Addendum)
45fr 41fr and 11 fr sheaths aspirated and removed from rfv, manual pressure applied for 30 minutes. No S+S of hematoma, groin level 0. Tegaderm dressing applied, bedrest instructions given.   Bedrest begins at 12:35:00  Distal pulses: bilateral dp and pt pulses present and easily palpable.

## 2015-01-31 NOTE — Discharge Summary (Signed)
Physician Discharge Summary  Patient ID: Dennis King MRN: [id number] DOB/AGE: [birth year] 64 y.o.   Primary Cardiologist: Dr. Allyson Sabal Electrophysiologist: Dr. [name]  Admit date: 01/31/2015 Discharge date: 02/01/2015  Admission Diagnoses: Atrial Fibrillation  Discharge Diagnoses:  Active Problems:   A-fib   Discharged Condition: stable  Hospital Course: SYON TEWS is a 64 y.o. male with a history of paroxysmal atrial fibrillation who presented to [facility] on 01/31/15 for elective EP study and radiofrequency ablation. Prior to the study, a TEE was performed which was negative for LAA thrombus. The EP study was conducted by Dr. [name]. He underwent successful electrical isolation and anatomical encircling of all four pulmonary veins with radiofrequency current.An additional box lesion was peformed with organization of atrial fibrillation into isthmus dependant right atrial flutter. Cavo-tricuspid isthmus ablation was performed with complete bidirectional isthmus block achieved.  No inducible arrhythmias following ablation. He tolerated the procedure well and left the EP lab in stable condition. He was monitored overnight and had no post procedural complications. He denied CP and dyspnea. There were no recurrent arrythmias on telemetry. His cath access site remained stable.  He had typical pleuritic chest pain post procedure.  He was last seen and examined by Dr. [name], who determined he was stable for discharge home.  A limited bedside echo on day of discharge revealed trivial apical pericardial effusion with no evidence of tamponade physiology.   A PPI was added at time of discharge, to be continued for 6 weeks. He will f/u with Rudi Coco, NP in 4 weeks and will see Dr. [name] in 6 weeks.   Consults: None  Significant Diagnostic Studies: See EP Study Report  Treatments: Afib Ablation   Physical Exam: Filed Vitals:   02/01/15 0024 02/01/15 0347 02/01/15 0352 02/01/15 0751  BP:    152/55 147/71  Pulse: 86 91 91 104  Temp:   98.9 F (37.2 C) 98.6 F (37 C)  TempSrc:   Oral Oral  Resp:   20 20  Height:      Weight:      SpO2: 95% 95% 94% 96%    GEN- The patient is well appearing, alert and oriented x 3 today.   Head- normocephalic, atraumatic Eyes-  Sclera clear, conjunctiva pink Ears- hearing intact Oropharynx- clear Neck- supple, Lungs- Clear to ausculation bilaterally, normal work of breathing Heart- Regular rate and rhythm, no murmurs, rubs or gallops, PMI not laterally displaced GI- soft, NT, ND, + BS Extremities- no clubbing, cyanosis, or edema, groin is without hematoma/ bruit MS- no significant deformity or atrophy Skin- no rash or lesion Psych- euthymic mood, full affect Neuro- strength and sensation are intact  Disposition: 01-Home or Self Care     Medication List    TAKE these medications        acetaminophen 325 MG tablet  Commonly known as:  TYLENOL  Take 2 tablets (650 mg total) by mouth every 4 (four) hours as needed for headache or mild pain.     carvedilol 25 MG tablet  Commonly known as:  COREG  Take 25 mg by mouth 2 (two) times daily with a meal.     ELIQUIS 5 MG Tabs tablet  Generic drug:  apixaban  Take 5 mg by mouth 2 (two) times daily.     HUMALOG MIX 75/25 (75-25) 100 UNIT/ML Susp injection  Generic drug:  insulin lispro protamine-lispro  Inject 20-40 Units into the skin 2 (two) times daily with a meal. >115 = 0 units, 115-130 =  15-20 units, 130-160 = 30 units, <160 = 40 units     JARDIANCE 25 MG Tabs tablet  Generic drug:  empagliflozin  Take 25 mg by mouth daily.     lisinopril-hydrochlorothiazide 20-25 MG per tablet  Commonly known as:  PRINZIDE,ZESTORETIC  Take 0.5-1 tablets by mouth daily. If blood pressure is low patient takes 0.5 tablet, if blood pressure is high patient takes 1 tablet     metFORMIN 1000 MG tablet  Commonly known as:  GLUCOPHAGE  Take 1,000 mg by mouth 2 (two) times daily with a meal.       MULTIVITAMIN ADULT PO  Take 1 tablet by mouth daily.     nitroGLYCERIN 0.4 MG SL tablet  Commonly known as:  NITROSTAT  Place 1 tablet (0.4 mg total) under the tongue every 5 (five) minutes as needed for chest pain.     NON FORMULARY  Cpap 8cm H2O     pantoprazole 40 MG tablet  Commonly known as:  PROTONIX  Take 1 tablet (40 mg total) by mouth daily.     Pitavastatin Calcium 4 MG Tabs  Take 1 tablet (4 mg total) by mouth daily.     pravastatin 20 MG tablet  Commonly known as:  PRAVACHOL  Take 1 tablet (20 mg total) by mouth every evening.       Follow-up Information    Follow up with CARROLL,DONNA, NP.   Specialties:  Nurse Practitioner, Cardiology   Why:  our office will call you with a follow-up appointment in the atrial fibrillation clinic in 4 weeks   Contact information:   [address] [place] [address] [phone]       Follow up with [name], MD.   Specialty:  Cardiology   Why:  our office will call you with an appointment in 3 months   Contact information:   [address] [place] [address] [phone]     TIME SPENT ON DISCHARGE INCLUDING PHYSICIAN TIME: >30 MINUTES  Signed: [name] MD, [facility] 02/01/2015, 8:52 AM

## 2015-01-31 NOTE — Anesthesia Preprocedure Evaluation (Addendum)
Anesthesia Evaluation  Patient identified by MRN, date of birth, ID band Patient awake    Reviewed: Allergy & Precautions, NPO status , Patient's Chart, lab work & pertinent test results  History of Anesthesia Complications Negative for: history of anesthetic complications  Airway Mallampati: III  TM Distance: <3 FB Neck ROM: Full    Dental no notable dental hx. (+) Teeth Intact, Dental Advisory Given   Pulmonary sleep apnea and Continuous Positive Airway Pressure Ventilation ,    Pulmonary exam normal breath sounds clear to auscultation       Cardiovascular hypertension, Pt. on medications and Pt. on home beta blockers + CAD and + Past MI  Normal cardiovascular exam Rhythm:Regular Rate:Normal  Paroxysmal a-fib   Neuro/Psych negative neurological ROS  negative psych ROS   GI/Hepatic negative GI ROS, Neg liver ROS,   Endo/Other  negative endocrine ROSdiabetes, Type 2, Insulin Dependent, Oral Hypoglycemic Agents  Renal/GU negative Renal ROS  negative genitourinary   Musculoskeletal negative musculoskeletal ROS (+)   Abdominal   Peds negative pediatric ROS (+)  Hematology negative hematology ROS (+)   Anesthesia Other Findings   Reproductive/Obstetrics negative OB ROS                            Anesthesia Physical Anesthesia Plan  ASA: III  Anesthesia Plan: General   Post-op Pain Management:    Induction: Intravenous  Airway Management Planned: LMA and Oral ETT  Additional Equipment:   Intra-op Plan:   Post-operative Plan: Extubation in OR  Informed Consent: I have reviewed the patients History and Physical, chart, labs and discussed the procedure including the risks, benefits and alternatives for the proposed anesthesia with the patient or authorized representative who has indicated his/her understanding and acceptance.   Dental advisory given  Plan Discussed with: CRNA and  Surgeon  Anesthesia Plan Comments:         Anesthesia Quick Evaluation

## 2015-01-31 NOTE — H&P (Signed)
Chief Complaint  Patient presents with  . Atrial Fibrillation    History of Present Illness: Dennis King is a 64 y.o. male who presents today for afib ablation. Pt reports having atrial fibrillation initially diagnosed last fall after presenting with symptoms of chest pressure and palpitations. He reports having a sense of "anxiety". This was associated with a rapid and irregular pulse. Episodes were initially <24 hours and occurred every few weeks. Episodes began to increase in frequency and duration. In retrospect, he thinks that this was probably occuring to some degree for at least a year. He currently feels that he is mostly in sinus though he is in afib today. He has not required cardioversion. He reports associated exercise intolerance and fatigue. He was evaluated by Dr Allyson Sabal. He was initiated on eliquis. His chronic coreg has been uptitrated for rate control.    Today, he denies symptoms of palpitations, chest pain, shortness of breath, orthopnea, PND, lower extremity edema, claudication, dizziness, presyncope, syncope, bleeding, or neurologic sequela. The patient is tolerating medications without difficulties and is otherwise without complaint today.    Past Medical History  Diagnosis Date  . Type 2 diabetes mellitus   . Hypertension   . Hyperlipidemia   . Family history of heart disease   . Obstructive sleep apnea     on C Pap  . Obesity   . Persistent atrial fibrillation   . Coronary artery disease     past post circumflex obtuse marginal branch cutting balloon atherectomy by Dr. [name]  . Arthritis    Past Surgical History  Procedure Laterality Date  . Appendectomy    . Cardiac catheterization N/A 10/22/2014    Procedure: Left Heart Cath and Coronary Angiography; Surgeon: Corky Crafts, MD; Location: [facility] INVASIVE CV LAB; Service: Cardiovascular; Laterality: N/A;  . Cardiac  catheterization Right 10/22/2014    Procedure: Coronary Stent Intervention; Surgeon: Corky Crafts, MD; Location: [facility] INVASIVE CV LAB; Service: Cardiovascular; Laterality: Right;     Current Outpatient Prescriptions  Medication Sig Dispense Refill  . acetaminophen (TYLENOL) 325 MG tablet Take 2 tablets (650 mg total) by mouth every 4 (four) hours as needed for headache or mild pain.    . carvedilol (COREG) 25 MG tablet Take 25 mg by mouth 2 (two) times daily with a meal.     . ELIQUIS 5 MG TABS tablet Take 1 tablet by mouth 2 (two) times daily.    Marland Kitchen HUMALOG MIX 75/25 (75-25) 100 UNIT/ML SUSP injection Inject 15-40 Units into the skin 2 (two) times daily with a meal.     . JARDIANCE 25 MG TABS tablet Take 1 tablet by mouth daily.    Marland Kitchen lisinopril-hydrochlorothiazide (PRINZIDE,ZESTORETIC) 20-25 MG per tablet Take 0.5-1 tablets by mouth daily.     . metFORMIN (GLUCOPHAGE) 1000 MG tablet Take 2,000 mg by mouth daily.    . Multiple Vitamins-Minerals (MULTIVITAMIN ADULT PO) Take 1 tablet by mouth daily.    . nitroGLYCERIN (NITROSTAT) 0.4 MG SL tablet Place 1 tablet (0.4 mg total) under the tongue every 5 (five) minutes as needed for chest pain. 25 tablet 2  . NON FORMULARY Cpap 8cm H2O     No current facility-administered medications for this visit.    Allergies: Statins and Tape   Social History: The patient  reports that he has never smoked. He does not have any smokeless tobacco history on file. He reports that he drinks about 8.4 - 18.0 oz of alcohol per week. He reports that he  does not use illicit drugs.   Family History: The patient's family history includes Atrial fibrillation in his mother; Heart attack (age of onset: 50) in his father.    ROS: Please see the history of present illness. All other systems are reviewed and negative.    PHYSICAL EXAM: Filed Vitals:   01/31/15 0538  BP: 106/74  Pulse:  53  Temp: 97.6 F (36.4 C)  Resp: 18   GEN: Well nourished, well developed, in no acute distress  HEENT: normal  Neck: no JVD, carotid bruits, or masses Cardiac: iRRR; no murmurs, rubs, or gallops,no edema  Respiratory: clear to auscultation bilaterally, normal work of breathing GI: soft, nontender, nondistended, + BS MS: no deformity or atrophy  Skin: warm and dry  Neuro: Strength and sensation are intact Psych: euthymic mood, full affect   Recent Labs: 10/20/2014: Magnesium 2.2; TSH 2.763 10/23/2014: BUN 20; Creatinine, Ser 1.02; Hemoglobin 13.3; Platelets 215; Potassium 4.0; Sodium 137    Lipid Panel   Labs (Brief)    No results found for: CHOL, TRIG, HDL, CHOLHDL, VLDL, LDLCALC, LDLDIRECT     Wt Readings from Last 3 Encounters:  12/18/14 120.566 kg (265 lb 12.8 oz)  11/22/14 118.888 kg (262 lb 1.6 oz)  10/23/14 116 kg (255 lb 11.7 oz)     ASSESSMENT AND PLAN:  1. paroxysmal atrial fibrillation chads2vasc score is 3. The patient has symptomatic afib. He has failed medical therapy with coreg. Therapeutic strategies for afib including medicine and ablation were discussed in detail with the patient today. Risk, benefits, and alternatives to EP study and radiofrequency ablation for afib were also discussed in detail today. These risks include but are not limited to stroke, bleeding, vascular damage, tamponade, perforation, damage to the esophagus, lungs, and other structures, pulmonary vein stenosis, worsening renal function, and death. The patient understands these risks and is ready to proceed.  [name] MD, [facility] 01/31/2015 7:28 AM

## 2015-01-31 NOTE — Progress Notes (Signed)
Pt places self on and off home cpap, RT informed pt to call for RT if he needs any help during the night

## 2015-01-31 NOTE — Transfer of Care (Signed)
Immediate Anesthesia Transfer of Care Note  Patient: Dennis King  Procedure(s) Performed: Procedure(s): Atrial Fibrillation Ablation (N/A)  Patient Location: Cath Lab  Anesthesia Type:General  Level of Consciousness: awake and patient cooperative  Airway & Oxygen Therapy: Patient Spontanous Breathing and Patient connected to nasal cannula oxygen  Post-op Assessment: Report given to RN  Post vital signs: Reviewed and stable  Last Vitals:  Filed Vitals:   01/31/15 0538  BP: 106/74  Pulse: 53  Temp: 36.4 C  Resp: 18    Complications: No apparent anesthesia complications

## 2015-02-01 DIAGNOSIS — I4892 Unspecified atrial flutter: Secondary | ICD-10-CM | POA: Diagnosis not present

## 2015-02-01 DIAGNOSIS — E119 Type 2 diabetes mellitus without complications: Secondary | ICD-10-CM | POA: Diagnosis not present

## 2015-02-01 DIAGNOSIS — I480 Paroxysmal atrial fibrillation: Secondary | ICD-10-CM | POA: Diagnosis not present

## 2015-02-01 DIAGNOSIS — I10 Essential (primary) hypertension: Secondary | ICD-10-CM | POA: Diagnosis not present

## 2015-02-01 LAB — BASIC METABOLIC PANEL
Anion gap: 8 (ref 5–15)
BUN: 18 mg/dL (ref 6–20)
CALCIUM: 8.3 mg/dL — AB (ref 8.9–10.3)
CHLORIDE: 104 mmol/L (ref 101–111)
CO2: 24 mmol/L (ref 22–32)
CREATININE: 1.13 mg/dL (ref 0.61–1.24)
GFR calc Af Amer: >60 mL/min (ref >60)
Glucose, Bld: 166 mg/dL — ABNORMAL HIGH (ref 65–99)
Potassium: 4.2 mmol/L (ref 3.5–5.1)
SODIUM: 136 mmol/L (ref 135–145)

## 2015-02-01 LAB — GLUCOSE, CAPILLARY: GLUCOSE-CAPILLARY: 174 mg/dL — AB (ref 65–99)

## 2015-02-01 MED ORDER — PANTOPRAZOLE SODIUM 40 MG PO TBEC: 40.0000 mg | DELAYED_RELEASE_TABLET | Freq: Every day | ORAL | Status: DC

## 2015-02-01 NOTE — Discharge Instructions (Signed)
No driving for 4 days. No lifting over 5 lbs for 1 week. No sexual activity for 1 week. You may return to work in 1 week. Keep procedure site clean & dry. If you notice increased pain, swelling, bleeding or pus, call/return!  You may shower, but no soaking baths/hot tubs/pools for 1 week.   IF you have worsening chest pain, shortness of breath, pain with swallowing, or any other symptoms, contact our office immediately.

## 2015-02-03 ENCOUNTER — Telehealth: Payer: Self-pay | Admitting: Pharmacist Clinician (PhC)/ Clinical Pharmacy Specialist

## 2015-02-03 ENCOUNTER — Encounter: Payer: Self-pay | Admitting: Cardiovascular Disease

## 2015-02-03 MED ORDER — PITAVASTATIN CALCIUM 4 MG PO TABS: 4.0000 mg | ORAL_TABLET | Freq: Every day | ORAL | Status: DC

## 2015-02-03 NOTE — Telephone Encounter (Signed)
Pt called to state had developed myalgias in his legs since starting pravastatin 20 mg about 2 weeks ago.  Has always managed to tolerate Livalo without issue.  Will re-submit to for approval

## 2015-02-04 ENCOUNTER — Encounter (HOSPITAL_COMMUNITY): Payer: Self-pay | Admitting: Cardiovascular Disease

## 2015-02-14 ENCOUNTER — Telehealth: Payer: Self-pay | Admitting: Pharmacist Clinician (PhC)/ Clinical Pharmacy Specialist

## 2015-02-14 NOTE — Telephone Encounter (Signed)
Patient has taken high dose livalo (4 mg) without problem.  Insurance will not cover as patient had not tried lovastatin or pravastatin previously.  Earlier this month gave prescription for pravastatin, but patient soon developed shoulder pain and had to stop.  Will have his pharmacy re-submit for Livalo and try prior authorization again.  Samples left at desk for patient

## 2015-02-18 ENCOUNTER — Telehealth: Payer: Self-pay

## 2015-02-18 NOTE — Telephone Encounter (Signed)
Opened in error

## 2015-02-18 NOTE — Telephone Encounter (Signed)
Prior auth for Livalo  sent to [facility] Patient has tried and failed Pravastatin .

## 2015-02-24 ENCOUNTER — Telehealth: Payer: Self-pay

## 2015-02-24 NOTE — Telephone Encounter (Signed)
Livalo has been denied by [facility] for 2nd time. Looks like he has not tried and failed 2 generic statins. Intolerant to Pravastatin. Must try Atorvastatin, Fluvastatin, lovastatin, or Simvastatin.

## 2015-02-24 NOTE — Telephone Encounter (Signed)
Chelley  Mr. Shon has failed atorvastatin, rosuvastatin and pravastatin.  Could you please send a PA request to Cover My Meds for his Livalo?  Thank you!!  Belenda Cruise

## 2015-02-24 NOTE — Telephone Encounter (Signed)
Letter of appeal, with documentation of tried and failed statins, sent to [facility].

## 2015-02-25 NOTE — Telephone Encounter (Signed)
Appeal letter mailed to [facility]. See telephone encounter from 02/24/15 by Odette Horns.

## 2015-03-03 ENCOUNTER — Inpatient Hospital Stay (HOSPITAL_COMMUNITY): Admission: RE | Admit: 2015-03-03 | Payer: Self-pay | Source: Ambulatory Visit | Admitting: Nurse Practitioner

## 2015-03-06 ENCOUNTER — Other Ambulatory Visit: Payer: Self-pay | Admitting: Internal Medicine

## 2015-03-06 MED ORDER — PANTOPRAZOLE SODIUM 40 MG PO TBEC: 40.0000 mg | DELAYED_RELEASE_TABLET | Freq: Every day | ORAL | Status: DC

## 2015-03-12 ENCOUNTER — Inpatient Hospital Stay (HOSPITAL_COMMUNITY): Admission: RE | Admit: 2015-03-12 | Payer: Self-pay | Source: Ambulatory Visit | Admitting: Nurse Practitioner

## 2015-03-20 ENCOUNTER — Encounter (HOSPITAL_COMMUNITY): Payer: Self-pay | Admitting: Nurse Practitioner

## 2015-03-20 ENCOUNTER — Ambulatory Visit (INDEPENDENT_AMBULATORY_CARE_PROVIDER_SITE_OTHER): Payer: 59 | Admitting: Pharmacist Clinician (PhC)/ Clinical Pharmacy Specialist

## 2015-03-20 ENCOUNTER — Ambulatory Visit (HOSPITAL_COMMUNITY)
Admission: RE | Admit: 2015-03-20 | Discharge: 2015-03-20 | Disposition: A | Discharge status: Home / Self Care | Payer: 59 | Source: Ambulatory Visit | Attending: Nurse Practitioner | Admitting: Nurse Practitioner

## 2015-03-20 ENCOUNTER — Encounter: Payer: Self-pay | Admitting: Pharmacist Clinician (PhC)/ Clinical Pharmacy Specialist

## 2015-03-20 VITALS — Ht 71.0 in | Wt 261.3 lb

## 2015-03-20 VITALS — BP 108/60 | HR 72 | Ht 71.0 in | Wt 261.0 lb

## 2015-03-20 DIAGNOSIS — E669 Obesity, unspecified: Secondary | ICD-10-CM | POA: Insufficient documentation

## 2015-03-20 DIAGNOSIS — I2510 Atherosclerotic heart disease of native coronary artery without angina pectoris: Secondary | ICD-10-CM | POA: Diagnosis not present

## 2015-03-20 DIAGNOSIS — I4891 Unspecified atrial fibrillation: Secondary | ICD-10-CM | POA: Diagnosis not present

## 2015-03-20 DIAGNOSIS — E785 Hyperlipidemia, unspecified: Secondary | ICD-10-CM | POA: Diagnosis not present

## 2015-03-20 DIAGNOSIS — I481 Persistent atrial fibrillation: Secondary | ICD-10-CM | POA: Insufficient documentation

## 2015-03-20 DIAGNOSIS — G4733 Obstructive sleep apnea (adult) (pediatric): Secondary | ICD-10-CM | POA: Insufficient documentation

## 2015-03-20 DIAGNOSIS — I10 Essential (primary) hypertension: Secondary | ICD-10-CM | POA: Diagnosis not present

## 2015-03-20 MED ORDER — PITAVASTATIN CALCIUM 2 MG PO TABS: 4.0000 mg | ORAL_TABLET | Freq: Every day | ORAL | Status: DC

## 2015-03-20 NOTE — Progress Notes (Signed)
Patient ID: Dennis King, male   DOB: 11-13-1950, 64 y.o.   MRN: [id number]     Primary Care Physician: Lenora Boys, MD Referring Physician: Dr. [name] is a 64 y.o. male with a h/o CAD, HTN, DM and persistent afib that is in the afib clinic for f/u of ablation 9/9. He reports 3 days of afib early on after ablation but has not had any heart irregularity lately. He had angioplasty shortly before ablation and does not describe any further chest pain. He denies any swallowing issues or rt groin issues since ablation.  Today, he denies symptoms of palpitations, chest pain, shortness of breath, orthopnea, PND, lower extremity edema, dizziness, presyncope, syncope, or neurologic sequela. The patient is tolerating medications without difficulties and is otherwise without complaint today.   Past Medical History  Diagnosis Date  . Type 2 diabetes mellitus (HCC)   . Hypertension   . Hyperlipidemia   . Family history of heart disease   . Obstructive sleep apnea     on C Pap  . Obesity   . Paroxysmal a-fib (HCC)   . Coronary artery disease     past post circumflex obtuse marginal branch cutting balloon atherectomy by Dr. [name]  . Arthritis    Past Surgical History  Procedure Laterality Date  . Appendectomy    . Cardiac catheterization N/A 10/22/2014    Procedure: Left Heart Cath and Coronary Angiography;  Surgeon: Corky Crafts, MD;  Location: [facility] INVASIVE CV LAB;  Service: Cardiovascular;  Laterality: N/A;  . Cardiac catheterization Right 10/22/2014    Procedure: Coronary Stent Intervention;  Surgeon: Corky Crafts, MD;  Location: [facility] INVASIVE CV LAB;  Service: Cardiovascular;  Laterality: Right;  . Electrophysiologic study N/A 01/31/2015    Procedure: Atrial Fibrillation Ablation;  Surgeon: [name], MD;  Location: [facility] INVASIVE CV LAB;  Service: Cardiovascular;  Laterality: N/A;  . Tee without cardioversion N/A 01/30/2015    Procedure: TRANSESOPHAGEAL  ECHOCARDIOGRAM (TEE);  Surgeon: Wendall Stade, MD;  Location: [facility] ENDOSCOPY;  Service: Cardiovascular;  Laterality: N/A;    Current Outpatient Prescriptions  Medication Sig Dispense Refill  . acetaminophen (TYLENOL) 325 MG tablet Take 2 tablets (650 mg total) by mouth every 4 (four) hours as needed for headache or mild pain.    Marland Kitchen aspirin 81 MG tablet Take 81 mg by mouth daily.    . carvedilol (COREG) 25 MG tablet Take 25 mg by mouth 2 (two) times daily with a meal.     . ELIQUIS 5 MG TABS tablet Take 5 mg by mouth 2 (two) times daily.     Marland Kitchen HUMALOG MIX 75/25 (75-25) 100 UNIT/ML SUSP injection Inject 20-40 Units into the skin 2 (two) times daily with a meal. >115 = 0 units, 115-130 = 15-20 units, 130-160 = 30 units, <160 = 40 units    . JARDIANCE 25 MG TABS tablet Take 25 mg by mouth daily.     Marland Kitchen lisinopril-hydrochlorothiazide (PRINZIDE,ZESTORETIC) 20-25 MG per tablet Take 0.5-1 tablets by mouth daily. If blood pressure is low patient takes 0.5 tablet, if blood pressure is high patient takes 1 tablet    . metFORMIN (GLUCOPHAGE) 1000 MG tablet Take 1,000 mg by mouth 2 (two) times daily with a meal.     . Multiple Vitamins-Minerals (MULTIVITAMIN ADULT PO) Take 1 tablet by mouth daily.    . NON FORMULARY Cpap 8cm H2O    . nitroGLYCERIN (NITROSTAT) 0.4 MG SL tablet Place 1  tablet (0.4 mg total) under the tongue every 5 (five) minutes as needed for chest pain. (Patient not taking: Reported on 03/20/2015) 25 tablet 2  . Pitavastatin Calcium 4 MG TABS Take 1 tablet (4 mg total) by mouth daily. (Patient not taking: Reported on 03/20/2015) 30 tablet 5   No current facility-administered medications for this encounter.    Allergies  Allergen Reactions  . Statins Other (See Comments)    Specifically Atorvastatin and C[place]oe - myalgia  . Tape Rash and Other (See Comments)    Paper tape please.    Social History   Social History  . Marital Status: Married    Spouse Name: N/A  . Number of Children:  2  . Years of Education: N/A   Occupational History  . Not on file.   Social History Main Topics  . Smoking status: Never Smoker   . Smokeless tobacco: Not on file  . Alcohol Use: 8.4 - 18.0 oz/week    0 Standard drinks or equivalent, 14-30 Glasses of wine per week     Comment: previously 6 glasses of wine per night, currently 2 glasses per night  . Drug Use: No  . Sexual Activity: Not on file   Other Topics Concern  . Not on file   Social History Narrative   Retired International aid/development workerVeterinarian.  Drinks 12 cups of coffee daily.  [place]    Family History  Problem Relation Age of Onset  . Heart attack Father [id number]    Died age 63  . Atrial fibrillation Mother     ROS- All systems are reviewed and negative except as per the HPI above  Physical Exam: Filed Vitals:   03/20/15 0846  BP: 108/60  Pulse: 72  Height: 5\' 11"  (1.803 m)  Weight: 261 lb (118.389 kg)    GEN- The patient is well appearing, alert and oriented x 3 today.   Head- normocephalic, atraumatic Eyes-  Sclera clear, conjunctiva pink Ears- hearing intact Oropharynx- clear Neck- supple, no JVP Lymph- no cervical lymphadenopathy Lungs- Clear to ausculation bilaterally, normal work of breathing Heart- Regular rate and rhythm, no murmurs, rubs or gallops, PMI not laterally displaced GI- soft, NT, ND, + BS Extremities- no clubbing, cyanosis, or edema MS- no significant deformity or atrophy Skin- no rash or lesion Psych- euthymic mood, full affect Neuro- strength and sensation are intact  EKG-NSR, normal EKG v rate 72 bpm, pr int 154 ms, qrs int 88 ms, qtc 435 ms Epic records reviewed.  Assessment and Plan: 1. Persistent afib Doing well in SR after ablation Continue eliquis with a chadsvasc score of at least 3.  2. CAD Stable  3. HTN  well managed  4. Obesity Weight loss encouraged  5. OSA Continue cpap  F/u with Dr. Johney FrameAllred as scheduled in December F/u as needed in afib clinic

## 2015-03-20 NOTE — Assessment & Plan Note (Signed)
Will have patient go to lab this week for repeat of lipids.  Has not had drawn since July.  Since LDL goal is < 70 will most likely need PCSK-9 inhibitor in addition to Livalo.  He will continue with dietary modifications and activity levels.  Once we get labs in, will submit request for Repatha.

## 2015-03-20 NOTE — Progress Notes (Signed)
03/20/2015 GOPAL MALTER 02/27/1951 [id number]   HPI:  Dennis King is a 64 y.o. male patient of Dr Allyson Sabal, who presents today for a lipid clinic follow up.  I saw him last in July, and we have been trying to keep him on Livalo 4 mg since then.  His insurance would not cover as he had not tried enough other statin medications, specifically pravastatin, lovastatin or fluvastatin.  We did a trial of pravastatin, but he did not tolerate this, as muscle aches occurred within a week of starting.  We have been giving him Livalo samples since, and are still waiting for final decision from his insurance company.      RF:  HTN, OSA, DM, CAD with balloon angioplasty of 2nd obtuse marginal branch, AF  Meds: Livalo 4 mg   Tried in the past: atorvastatin 20 mg, Crestor 20 mg and pravastatin 20 mg, all caused myalgias of arms and legs; took Livalo previously without problem, but having problems getting it covered by his insurance  Family history:  Father had first MI at 64, died 10 yrs later from MI  Diet: his weight has fluctuated over the years, to a high of 290; currently his wife is trying to keep him on an Atkins like diet with low to no carbs.  Has held steady in the 260 range for some time   Exercise:  Nothing formal, retired International aid/development worker with large property, keeps busy  Labs:    All done at [facility];   11/2014:  TC 202, TG 123, HDL 54, LDL 123, LDL-P 1477  Current Outpatient Prescriptions  Medication Sig Dispense Refill  . acetaminophen (TYLENOL) 325 MG tablet Take 2 tablets (650 mg total) by mouth every 4 (four) hours as needed for headache or mild pain.    Marland Kitchen aspirin 81 MG tablet Take 81 mg by mouth daily.    . carvedilol (COREG) 25 MG tablet Take 25 mg by mouth 2 (two) times daily with a meal.     . ELIQUIS 5 MG TABS tablet Take 5 mg by mouth 2 (two) times daily.     Marland Kitchen HUMALOG MIX 75/25 (75-25) 100 UNIT/ML SUSP injection Inject 20-40 Units into the skin 2 (two) times daily with a meal. >115  = 0 units, 115-130 = 15-20 units, 130-160 = 30 units, <160 = 40 units    . JARDIANCE 25 MG TABS tablet Take 25 mg by mouth daily.     Marland Kitchen lisinopril-hydrochlorothiazide (PRINZIDE,ZESTORETIC) 20-25 MG per tablet Take 0.5-1 tablets by mouth daily. If blood pressure is low patient takes 0.5 tablet, if blood pressure is high patient takes 1 tablet    . metFORMIN (GLUCOPHAGE) 1000 MG tablet Take 1,000 mg by mouth 2 (two) times daily with a meal.     . Multiple Vitamins-Minerals (MULTIVITAMIN ADULT PO) Take 1 tablet by mouth daily.    . nitroGLYCERIN (NITROSTAT) 0.4 MG SL tablet Place 1 tablet (0.4 mg total) under the tongue every 5 (five) minutes as needed for chest pain. (Patient not taking: Reported on 03/20/2015) 25 tablet 2  . NON FORMULARY Cpap 8cm H2O    . Pitavastatin Calcium 4 MG TABS Take 1 tablet (4 mg total) by mouth daily. (Patient not taking: Reported on 03/20/2015) 30 tablet 5   No current facility-administered medications for this visit.    Allergies  Allergen Reactions  . Statins Other (See Comments)    Specifically Atorvastatin and Crestoe - myalgia  . Tape Rash and Other (See Comments)  Paper tape please.    Past Medical History  Diagnosis Date  . Type 2 diabetes mellitus (HCC)   . Hypertension   . Hyperlipidemia   . Family history of heart disease   . Obstructive sleep apnea     on C Pap  . Obesity   . Paroxysmal a-fib (HCC)   . Coronary artery disease     past post circumflex obtuse marginal branch cutting balloon atherectomy by Dr. Eldridge DaceVaranasi  . Arthritis     Height 5\' 11"  (1.803 m), weight 261 lb 4.8 oz (118.525 kg).   Phillips HayKristin Jaslen Adcox PharmD CPP [facility]

## 2015-03-20 NOTE — Patient Instructions (Addendum)
Continue with the Livalo 4 mg daily.    We will start the paperwork for Repatha or Praluent injections  Go to the lab and have cholesterol levels drawn in the next week.  Please fast for these labs

## 2015-03-24 ENCOUNTER — Telehealth: Payer: Self-pay | Admitting: Cardiovascular Disease

## 2015-03-25 NOTE — Telephone Encounter (Signed)
Closed encounter °

## 2015-04-09 ENCOUNTER — Other Ambulatory Visit: Payer: Self-pay | Admitting: Cardiovascular Disease

## 2015-04-09 LAB — HEPATIC FUNCTION PANEL
ALBUMIN: 4.4 g/dL (ref 3.6–5.1)
ALK PHOS: 37 U/L — AB (ref 40–115)
ALT: 24 U/L (ref 9–46)
AST: 20 U/L (ref 10–35)
Bilirubin, Direct: 0.1 mg/dL (ref <=0.2)
Indirect Bilirubin: 0.3 mg/dL (ref 0.2–1.2)
Total Bilirubin: 0.4 mg/dL (ref 0.2–1.2)
Total Protein: 7.1 g/dL (ref 6.1–8.1)

## 2015-04-09 LAB — LIPID PANEL
CHOL/HDL RATIO: 2.8 ratio (ref <=5.0)
CHOLESTEROL: 138 mg/dL (ref 125–200)
HDL: 49 mg/dL (ref >=40)
LDL Cholesterol: 65 mg/dL (ref <130)
Triglycerides: 122 mg/dL (ref <150)
VLDL: 24 mg/dL (ref <30)

## 2015-05-05 ENCOUNTER — Ambulatory Visit: Payer: Self-pay | Admitting: Internal Medicine

## 2015-05-07 ENCOUNTER — Ambulatory Visit: Payer: Self-pay | Admitting: Internal Medicine

## 2015-05-13 ENCOUNTER — Other Ambulatory Visit: Payer: Self-pay

## 2015-05-13 MED ORDER — ELIQUIS 5 MG PO TABS: 5.0000 mg | ORAL_TABLET | Freq: Two times a day (BID) | ORAL | Status: DC

## 2015-05-14 MED ORDER — ELIQUIS 5 MG PO TABS: 5.0000 mg | ORAL_TABLET | Freq: Two times a day (BID) | ORAL | Status: DC

## 2015-06-04 ENCOUNTER — Telehealth: Payer: Self-pay | Admitting: Internal Medicine

## 2015-06-04 NOTE — Telephone Encounter (Signed)
New message      Talk to Belenda CruiseKristin regarding getting the livalo approved by the ins company.  Pt is now out of medication.  Please call

## 2015-06-05 ENCOUNTER — Other Ambulatory Visit: Payer: Self-pay | Admitting: Pharmacist Clinician (PhC)/ Clinical Pharmacy Specialist

## 2015-06-05 MED ORDER — PITAVASTATIN CALCIUM 4 MG PO TABS: 4.0000 mg | ORAL_TABLET | Freq: Every day | ORAL | Status: DC

## 2015-06-05 NOTE — Telephone Encounter (Signed)
Routed to Belenda CruiseKristin:   Lincoln MaxinKedra Sumner at 06/05/2015 9:29 AM     Status: Signed       Expand All Collapse All   Follow up   Pt calling toTalk to [place] regarding getting the livalo approved by the ins company. Pt is now out of medication.  And he needs a new Eliquis discount card Please call  I have also informed him that he has the option to request samples from [facility] he is a pt of [facility]            Donna M Price at 06/04/2015 9:45 AM     Status: Signed       Expand All Collapse All   New message      Talk to [place] regarding getting the livalo approved by the ins company. Pt is now out of medication. Please call

## 2015-06-05 NOTE — Telephone Encounter (Signed)
Spoke with patient, left Livalo samples (4 mg) at front desk, will leave new Eliquis copay card as well.   Rx for Livalo sent to pharmacy.  If they get denial we can work on [place]

## 2015-06-05 NOTE — Telephone Encounter (Signed)
Follow up   Pt calling toTalk to [place] regarding getting the livalo approved by the ins company. Pt is now out of medication.  And he needs a new Eliquis discount card Please call  I have also informed him that he has the option to request samples from [facility] he is a pt of [facility]

## 2015-06-05 NOTE — Telephone Encounter (Signed)
Pt out of Livalo, has new insurance as of January 1.  Will leave samples for 4 weeks at front desk as well as send prescription to pharmacy to determine what his coverage will be with the new insurance.

## 2015-06-11 ENCOUNTER — Other Ambulatory Visit: Payer: Self-pay | Admitting: *Deleted

## 2015-06-17 ENCOUNTER — Telehealth: Payer: Self-pay

## 2015-06-17 NOTE — Telephone Encounter (Signed)
[employer]. Livalo  Approved.  Pt contacted and made aware of approval and to contact pharmacy to get Rx Pt verbalized understanding, no questions at this time.

## 2015-06-18 ENCOUNTER — Ambulatory Visit (INDEPENDENT_AMBULATORY_CARE_PROVIDER_SITE_OTHER): Payer: BLUE CROSS/BLUE SHIELD | Admitting: Internal Medicine

## 2015-06-18 ENCOUNTER — Encounter: Payer: Self-pay | Admitting: Internal Medicine

## 2015-06-18 VITALS — BP 138/72 | HR 78 | Ht 71.0 in | Wt 259.6 lb

## 2015-06-18 DIAGNOSIS — I480 Paroxysmal atrial fibrillation: Secondary | ICD-10-CM | POA: Diagnosis not present

## 2015-06-18 DIAGNOSIS — G4733 Obstructive sleep apnea (adult) (pediatric): Secondary | ICD-10-CM

## 2015-06-18 DIAGNOSIS — I10 Essential (primary) hypertension: Secondary | ICD-10-CM

## 2015-06-18 NOTE — Progress Notes (Signed)
Electrophysiology Office Note   Date:  06/18/2015   ID:  Dennis King, DOB 08-11-1950, MRN [id number]  PCP:  Lenora Boys, MD  Cardiologist:  Dr Allyson Sabal Primary Electrophysiologist: [name], MD    Chief Complaint  Patient presents with  . Atrial Fibrillation     History of Present Illness: Dennis King is a 65 y.o. male who presents today for electrophysiology evaluation.  He has done well since his recent afib ablation.  Denies procedure related complications.  Unaware of any recent afib. Today, he denies symptoms of palpitations, chest pain, shortness of breath, orthopnea, PND, lower extremity edema, claudication, dizziness, presyncope, syncope, bleeding, or neurologic sequela. The patient is tolerating medications without difficulties and is otherwise without complaint today.    Past Medical History  Diagnosis Date  . Type 2 diabetes mellitus (HCC)   . Hypertension   . Hyperlipidemia   . Family history of heart disease   . Obstructive sleep apnea     on C Pap  . Obesity   . Paroxysmal a-fib (HCC)   . Coronary artery disease     past post circumflex obtuse marginal branch cutting balloon atherectomy by Dr. [name]  . Arthritis    Past Surgical History  Procedure Laterality Date  . Appendectomy    . Cardiac catheterization N/A 10/22/2014    Procedure: Left Heart Cath and Coronary Angiography;  Surgeon: Corky Crafts, MD;  Location: [facility] INVASIVE CV LAB;  Service: Cardiovascular;  Laterality: N/A;  . Cardiac catheterization Right 10/22/2014    Procedure: Coronary Stent Intervention;  Surgeon: Corky Crafts, MD;  Location: [facility] INVASIVE CV LAB;  Service: Cardiovascular;  Laterality: Right;  . Electrophysiologic study N/A 01/31/2015    Procedure: Atrial Fibrillation Ablation;  Surgeon: [name], MD;  Location: [facility] INVASIVE CV LAB;  Service: Cardiovascular;  Laterality: N/A;  . Tee without cardioversion N/A 01/30/2015    Procedure: TRANSESOPHAGEAL  ECHOCARDIOGRAM (TEE);  Surgeon: Wendall Stade, MD;  Location: [facility] ENDOSCOPY;  Service: Cardiovascular;  Laterality: N/A;     Current Outpatient Prescriptions  Medication Sig Dispense Refill  . acetaminophen (TYLENOL) 325 MG tablet Take 2 tablets (650 mg total) by mouth every 4 (four) hours as needed for headache or mild pain.    Marland Kitchen aspirin 81 MG tablet Take 81 mg by mouth daily.    . carvedilol (COREG) 25 MG tablet Take 25 mg by mouth 2 (two) times daily with a meal.     . ELIQUIS 5 MG TABS tablet Take 1 tablet (5 mg total) by mouth 2 (two) times daily. 60 tablet 5  . HUMALOG MIX 75/25 (75-25) 100 UNIT/ML SUSP injection Inject 20-40 Units into the skin 2 (two) times daily with a meal. >115 = 0 units, 115-130 = 15-20 units, 130-160 = 30 units, <160 = 40 units    . JARDIANCE 25 MG TABS tablet Take 25 mg by mouth daily.     Marland Kitchen lisinopril-hydrochlorothiazide (PRINZIDE,ZESTORETIC) 20-25 MG per tablet Take 0.5-1 tablets by mouth daily. If blood pressure is low patient takes 0.5 tablet, if blood pressure is high patient takes 1 tablet    . metFORMIN (GLUCOPHAGE) 1000 MG tablet Take 1,000 mg by mouth 2 (two) times daily with a meal.     . Multiple Vitamins-Minerals (MULTIVITAMIN ADULT PO) Take 1 tablet by mouth daily.    . nitroGLYCERIN (NITROSTAT) 0.4 MG SL tablet Place 1 tablet (0.4 mg total) under the tongue every 5 (five) minutes as needed for  chest pain. 25 tablet 2  . NON FORMULARY Cpap 8cm H2O    . Pitavastatin Calcium 4 MG TABS Take 1 tablet (4 mg total) by mouth daily. 30 tablet 5   No current facility-administered medications for this visit.    Allergies:   Statins and Tape   Social History:  The patient  reports that he has never smoked. He does not have any smokeless tobacco history on file. He reports that he drinks about 8.4 - 18.0 oz of alcohol per week. He reports that he does not use illicit drugs.   Family History:  The patient's  family history includes Atrial fibrillation in his  mother; Heart attack (age of onset: 82) in his father.    ROS:  Please see the history of present illness.   All other systems are reviewed and negative.    PHYSICAL EXAM: VS:  BP 138/72 mmHg  Pulse 78  Ht  (1.803 m)  Wt 259 lb 9.6 oz (117.754 kg)  BMI 36.22 kg/m2 , BMI Body mass index is 36.22 kg/(m^2). GEN: Well nourished, well developed, in no acute distress HEENT: normal Neck: no JVD, carotid bruits, or masses Cardiac: iRRR; no murmurs, rubs, or gallops,no edema  Respiratory:  clear to auscultation bilaterally, normal work of breathing GI: soft, nontender, nondistended, + BS MS: no deformity or atrophy Skin: warm and dry  Neuro:  Strength and sensation are intact Psych: euthymic mood, full affect  EKG:  EKG is ordered today. The ekg ordered today shows sinus rhythm, 78 bpm, Qtc 426 msec, otherwise normal ekg   Recent Labs: 10/20/2014: Magnesium 2.2; TSH 2.763 01/20/2015: Hemoglobin 14.6; Platelets 210.0 02/01/2015: BUN 18; Creatinine, Ser 1.13; Potassium 4.2; Sodium 136 04/09/2015: ALT 24    Lipid Panel     Component Value Date/Time   CHOL 138 04/09/2015 0839   TRIG 122 04/09/2015 0839   HDL 49 04/09/2015 0839   CHOLHDL 2.8 04/09/2015 0839   VLDL 24 04/09/2015 0839   LDLCALC 65 04/09/2015 0839     Wt Readings from Last 3 Encounters:  06/18/15 259 lb 9.6 oz (117.754 kg)  03/20/15 261 lb 4.8 oz (118.525 kg)  03/20/15 261 lb (118.389 kg)     ASSESSMENT AND PLAN:  1.  paroxysmal atrial fibrillation chads2vasc score is 3.  Continue eliquis long term Doing well s/p ablatin off of AAD therapy No changes today  2. htn Stable No change required today  3. OSA Compliance with CPAP is advised  4. Obesity Body mass index is 36.22 kg/(m^2). Regular exercise, weight reduction, and lifestyle modification discussed at length today.  Current medicines are reviewed at length with the patient today.   The patient does not have concerns regarding his  medicines.  The following changes were made today:  none  Follow-up with Dr Allyson Sabal in 3 months I will see in 6 months  Signed, [name], MD  06/18/2015 10:02 AM     [facility] [address] [place] [address] [phone] (office) [phone] (fax)

## 2015-06-18 NOTE — Patient Instructions (Signed)
Medication Instructions:  Your physician recommends that you continue on your current medications as directed. Please refer to the Current Medication list given to you today.   Labwork: None ordered   Testing/Procedures: None ordered   Follow-Up: Your physician recommends that you schedule a follow-up appointment in: 3 months with Dr Allyson Sabal and 6 months with Dr [name]   Any Other Special Instructions Will Be Listed Below (If Applicable).     If you need a refill on your cardiac medications before your next appointment, please call your pharmacy.

## 2015-06-27 ENCOUNTER — Telehealth: Payer: Self-pay

## 2015-06-27 NOTE — Telephone Encounter (Signed)
Prior authorization for Livalo 4 mg has been approved through 05/23/2038.

## 2015-06-27 NOTE — Telephone Encounter (Signed)
Prior authorization for Livalo 4 mg sent to patient's insurance company via covermymeds. Awaiting approval.

## 2015-09-12 ENCOUNTER — Encounter: Payer: Self-pay | Admitting: Cardiovascular Disease

## 2015-09-12 ENCOUNTER — Ambulatory Visit (INDEPENDENT_AMBULATORY_CARE_PROVIDER_SITE_OTHER): Payer: BLUE CROSS/BLUE SHIELD | Admitting: Cardiovascular Disease

## 2015-09-12 VITALS — BP 148/84 | HR 65 | Ht 71.0 in | Wt 255.5 lb

## 2015-09-12 DIAGNOSIS — G4733 Obstructive sleep apnea (adult) (pediatric): Secondary | ICD-10-CM | POA: Diagnosis not present

## 2015-09-12 DIAGNOSIS — E785 Hyperlipidemia, unspecified: Secondary | ICD-10-CM

## 2015-09-12 DIAGNOSIS — Z9861 Coronary angioplasty status: Secondary | ICD-10-CM

## 2015-09-12 DIAGNOSIS — I480 Paroxysmal atrial fibrillation: Secondary | ICD-10-CM | POA: Diagnosis not present

## 2015-09-12 DIAGNOSIS — I10 Essential (primary) hypertension: Secondary | ICD-10-CM | POA: Diagnosis not present

## 2015-09-12 DIAGNOSIS — I2510 Atherosclerotic heart disease of native coronary artery without angina pectoris: Secondary | ICD-10-CM

## 2015-09-12 NOTE — Assessment & Plan Note (Signed)
History of hyperlipidemia on statin therapy with recent lipid profile performed 04/09/15 revealing total cholesterol 138, LDL 65 HDL of 49

## 2015-09-12 NOTE — Assessment & Plan Note (Signed)
History of hypertension blood pressure measured at 130/72. He is on carvedilol, lisinopril, and hydrochlorothiazide. Continue current meds at current dosing

## 2015-09-12 NOTE — Progress Notes (Signed)
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  He was referred for evaluation of palpitations and chest pain.I last saw him in the office 11/22/14. Marland KitchenHis cardiac risk factor profile is notable for treated hypertension, diabetes and hyperlipidemia. He does have a family history of heart disease with a father who had his first MI at age 18 and died 10 years later from this. His mother had atrial fibrillation. He does have obstructive sleep apnea C Pap. He noted palpitations last summer which has been occurring on a monthly basis is associated with chest pressure and relative hypotension. He does drink 4 glasses of wine at night and several glasses of whiskey a week. He had a Myoview stress test performed after that that showed normal perfusion with decreased LV function and a 2-D echo that showed EF in the 45% range. He ultimately was admitted with unstable angina by Dr. Antoine Poche and had positive enzymes. Cardiac catheterization performed by Dr. [name]. revealed a high-grade ostial second obtuse marginal branch stenosis which underwent cutting balloon atherectomy with an excellent angiographic result. An event monitor confirmed A. Fib ablation which he is symptomatic from. Since discharge he has decreased his alcohol intake. We are stopping the Plavix today and initiating oral and a coagulation because his CHA2DSVASC2 score is 3 . I referred him to Dr. [name] who ultimately performed A. Fib ablation in September 2016. He's had some minor breakthrough episodes but for most part has maintained sinus rhythm on Eliquis oral anticoagulation.  Current Outpatient Prescriptions  Medication Sig Dispense Refill  . acetaminophen (TYLENOL) 325 MG tablet Take 2 tablets (650 mg total)  by mouth every 4 (four) hours as needed for headache or mild pain.    Marland Kitchen aspirin 81 MG tablet Take 81 mg by mouth daily.    . carvedilol (COREG) 25 MG tablet Take 25 mg by mouth 2 (two) times daily with a meal.     . diclofenac sodium (VOLTAREN) 1 % GEL Apply 2 g topically 4 (four) times daily.    Marland Kitchen ELIQUIS 5 MG TABS tablet Take 1 tablet (5 mg total) by mouth 2 (two) times daily. 60 tablet 5  . HUMALOG MIX 75/25 (75-25) 100 UNIT/ML SUSP injection Inject 20-40 Units into the skin 2 (two) times daily with a meal. >115 = 0 units, 115-130 = 15-20 units, 130-160 = 30 units, <160 = 40 units    . INVOKANA 300 MG TABS tablet Take 1 tablet by mouth daily.  4  . JARDIANCE 25 MG TABS tablet Take 25 mg by mouth daily.     Marland Kitchen lisinopril-hydrochlorothiazide (PRINZIDE,ZESTORETIC) 20-25 MG per tablet Take 0.5-1 tablets by mouth daily. If blood pressure is low patient takes 0.5 tablet, if blood pressure is high patient takes 1 tablet    . metFORMIN (GLUCOPHAGE) 1000 MG tablet Take 1,000 mg by mouth 2 (two) times daily with a meal.     . Multiple Vitamins-Minerals (MULTIVITAMIN ADULT PO) Take 1 tablet by mouth daily.    . nitroGLYCERIN (NITROSTAT) 0.4 MG SL tablet Place 1 tablet (0.4 mg total) under the tongue every 5 (five) minutes as needed for chest pain. 25 tablet 2  . NON FORMULARY Cpap 8cm H2O    . Pitavastatin Calcium 4 MG TABS Take 1  tablet (4 mg total) by mouth daily. 30 tablet 5   No current facility-administered medications for this visit.    Allergies  Allergen Reactions  . Statins Other (See Comments)    Specifically Atorvastatin and Crestor - myalgia  . Tape Rash and Other (See Comments)    Paper tape please.    Social History   Social History  . Marital Status: Married    Spouse Name: N/A  . Number of Children: 2  . Years of Education: N/A   Occupational History  . Not on file.   Social History Main Topics  . Smoking status: Never Smoker   . Smokeless tobacco: Not on file  .  Alcohol Use: 8.4 - 18.0 oz/week    0 Standard drinks or equivalent, 14-30 Glasses of wine per week     Comment: previously 6 glasses of wine per night, currently 2 glasses per night  . Drug Use: No  . Sexual Activity: Not on file   Other Topics Concern  . Not on file   Social History Narrative   Retired International aid/development workerVeterinarian.  Drinks 12 cups of coffee daily.  [place]     Review of Systems: General: negative for chills, fever, night sweats or weight changes.  Cardiovascular: negative for chest pain, dyspnea on exertion, edema, orthopnea, palpitations, paroxysmal nocturnal dyspnea or shortness of breath Dermatological: negative for rash Respiratory: negative for cough or wheezing Urologic: negative for hematuria Abdominal: negative for nausea, vomiting, diarrhea, bright red blood per rectum, melena, or hematemesis Neurologic: negative for visual changes, syncope, or dizziness All other systems reviewed and are otherwise negative except as noted above.    Blood pressure 148/84, pulse 65, height 5\' 11"  (1.803 m), weight 255 lb 8 oz (115.894 kg).  General appearance: alert and no distress Neck: no adenopathy, no carotid bruit, no JVD, supple, symmetrical, trachea midline and thyroid not enlarged, symmetric, no tenderness/mass/nodules Lungs: clear to auscultation bilaterally Heart: regular rate and rhythm, S1, S2 normal, no murmur, click, rub or gallop Extremities: extremities normal, atraumatic, no cyanosis or edema  EKG not performed today  ASSESSMENT AND PLAN:   Essential hypertension History of hypertension blood pressure measured at 130/72. He is on carvedilol, lisinopril, and hydrochlorothiazide. Continue current meds at current dosing  Hyperlipidemia History of hyperlipidemia on statin therapy with recent lipid profile performed 04/09/15 revealing total cholesterol 138, LDL 65 HDL of 49  Obstructive sleep apnea-on C-pap History of obstructive sleep apnea on C  Pap.Marland Kitchen.  PAF- CHADS VASc= 3 (HTN, DM, CAD).  History of paroxysmal atrial fibrillation status post ablation in September 2016 by Dr. Johney FrameAllred. The CHA2DSVASC2 score is 3  . He did have some breakthrough PAF last month when he took a decongestant which resolved by taking extra carvedilol. He remains on Eliquis  oral anticoagulation.  CAD S/P ostial OM2 POBA Status post non-STEMI and subsequent cath revealing and second obtuse marginal branch ostial stenosis which underwent went cutting balloon angioplasty by Dr. Eldridge DaceVaranasi 10/22/14.      Runell GessJonathan J. Nation Cradle MD FACP,FACC,FAHA, [facility] 09/12/2015 2:07 PM

## 2015-09-12 NOTE — Assessment & Plan Note (Signed)
Status post non-STEMI and subsequent cath revealing and second obtuse marginal branch ostial stenosis which underwent went cutting balloon angioplasty by Dr. Eldridge DaceVaranasi 10/22/14.

## 2015-09-12 NOTE — Patient Instructions (Signed)

## 2015-09-12 NOTE — Assessment & Plan Note (Signed)
History of obstructive sleep apnea on C Pap 

## 2015-09-12 NOTE — Assessment & Plan Note (Signed)
History of paroxysmal atrial fibrillation status post ablation in September 2016 by Dr. Johney FrameAllred. The CHA2DSVASC2 score is 3  . He did have some breakthrough PAF last month when he took a decongestant which resolved by taking extra carvedilol. He remains on Eliquis  oral anticoagulation.

## 2015-09-16 ENCOUNTER — Ambulatory Visit: Payer: BLUE CROSS/BLUE SHIELD | Admitting: Cardiovascular Disease

## 2015-12-09 ENCOUNTER — Other Ambulatory Visit: Payer: Self-pay

## 2015-12-09 MED ORDER — ELIQUIS 5 MG PO TABS: 5.0000 mg | ORAL_TABLET | Freq: Two times a day (BID) | ORAL | Status: DC

## 2016-01-30 ENCOUNTER — Telehealth: Payer: Self-pay | Admitting: Internal Medicine

## 2016-01-30 ENCOUNTER — Encounter: Payer: Self-pay | Admitting: Internal Medicine

## 2016-01-30 NOTE — Telephone Encounter (Signed)
Error. close encounter

## 2016-02-02 ENCOUNTER — Ambulatory Visit (INDEPENDENT_AMBULATORY_CARE_PROVIDER_SITE_OTHER): Payer: BLUE CROSS/BLUE SHIELD | Admitting: Internal Medicine

## 2016-02-02 ENCOUNTER — Encounter: Payer: Self-pay | Admitting: Internal Medicine

## 2016-02-02 VITALS — BP 140/78 | HR 56 | Ht 71.0 in | Wt 261.4 lb

## 2016-02-02 DIAGNOSIS — I480 Paroxysmal atrial fibrillation: Secondary | ICD-10-CM | POA: Diagnosis not present

## 2016-02-02 DIAGNOSIS — I2510 Atherosclerotic heart disease of native coronary artery without angina pectoris: Secondary | ICD-10-CM

## 2016-02-02 DIAGNOSIS — Z9861 Coronary angioplasty status: Secondary | ICD-10-CM

## 2016-02-02 DIAGNOSIS — G4733 Obstructive sleep apnea (adult) (pediatric): Secondary | ICD-10-CM

## 2016-02-02 DIAGNOSIS — I10 Essential (primary) hypertension: Secondary | ICD-10-CM

## 2016-02-02 NOTE — Progress Notes (Signed)
Electrophysiology Office Note   Date:  02/02/2016   ID:  PHI AVANS, DOB 1951/02/03, MRN [id number]  PCP:  Ethel Rana  Cardiologist:  Dr Allyson Sabal Primary Electrophysiologist: [name], MD    Chief Complaint  Patient presents with  . Atrial Fibrillation     History of Present Illness: Dennis King is a 65 y.o. male who presents today for electrophysiology evaluation.  He has done well since his recent afib ablation.  He has had several episodes of afib.  His most recent episode was this past weekend.  Unfortunately, he has not made attempts at weight reduction/ lifestyle modification.  Today, he denies symptoms of chest pain, shortness of breath, orthopnea, PND, lower extremity edema, claudication, dizziness, presyncope, syncope, bleeding, or neurologic sequela. The patient is tolerating medications without difficulties and is otherwise without complaint today.    Past Medical History:  Diagnosis Date  . Arthritis   . Coronary artery disease    past post circumflex obtuse marginal branch cutting balloon atherectomy by Dr. [name]  . Family history of heart disease   . Hyperlipidemia   . Hypertension   . Obesity   . Obstructive sleep apnea    on C Pap  . Paroxysmal a-fib (HCC)   . Type 2 diabetes mellitus (HCC)    Past Surgical History:  Procedure Laterality Date  . APPENDECTOMY    . CARDIAC CATHETERIZATION N/A 10/22/2014   Procedure: Left Heart Cath and Coronary Angiography;  Surgeon: Corky Crafts, MD;  Location: [facility] INVASIVE CV LAB;  Service: Cardiovascular;  Laterality: N/A;  . CARDIAC CATHETERIZATION Right 10/22/2014   Procedure: Coronary Stent Intervention;  Surgeon: Corky Crafts, MD;  Location: [facility] INVASIVE CV LAB;  Service: Cardiovascular;  Laterality: Right;  . ELECTROPHYSIOLOGIC STUDY N/A 01/31/2015   Procedure: Atrial Fibrillation Ablation;  Surgeon: [name], MD;  Location: [facility] INVASIVE CV LAB;  Service: Cardiovascular;  Laterality:  N/A;  . TEE WITHOUT CARDIOVERSION N/A 01/30/2015   Procedure: TRANSESOPHAGEAL ECHOCARDIOGRAM (TEE);  Surgeon: Wendall Stade, MD;  Location: [facility] ENDOSCOPY;  Service: Cardiovascular;  Laterality: N/A;     Current Outpatient Prescriptions  Medication Sig Dispense Refill  . acetaminophen (TYLENOL) 325 MG tablet Take 2 tablets (650 mg total) by mouth every 4 (four) hours as needed for headache or mild pain.    Marland Kitchen aspirin 81 MG tablet Take 81 mg by mouth daily.    . carvedilol (COREG) 25 MG tablet Take 25 mg by mouth 2 (two) times daily with a meal.     . diclofenac sodium (VOLTAREN) 1 % GEL Apply 2 g topically 4 (four) times daily.    Marland Kitchen ELIQUIS 5 MG TABS tablet Take 1 tablet (5 mg total) by mouth 2 (two) times daily. 60 tablet 5  . INVOKANA 300 MG TABS tablet Take 1 tablet by mouth daily.  4  . JARDIANCE 25 MG TABS tablet Take 25 mg by mouth daily.     Marland Kitchen lisinopril-hydrochlorothiazide (PRINZIDE,ZESTORETIC) 20-25 MG per tablet Take 1 tablet by mouth daily.     . metFORMIN (GLUCOPHAGE) 1000 MG tablet Take 1,000 mg by mouth 2 (two) times daily with a meal.     . Multiple Vitamins-Minerals (MULTIVITAMIN ADULT PO) Take 1 tablet by mouth daily.    . nitroGLYCERIN (NITROSTAT) 0.4 MG SL tablet Place 1 tablet (0.4 mg total) under the tongue every 5 (five) minutes as needed for chest pain. 25 tablet 2  . NON FORMULARY Cpap 8cm H2O    .  NOVOLOG MIX 70/30 (70-30) 100 UNIT/ML injection as directed. Sliding scale  4  . Pitavastatin Calcium 4 MG TABS Take 1 tablet (4 mg total) by mouth daily. 30 tablet 5   No current facility-administered medications for this visit.     Allergies:   Statins and Tape   Social History:  The patient  reports that he has never smoked. He has never used smokeless tobacco. He reports that he drinks about 8.4 - 18.0 oz of alcohol per week . He reports that he does not use drugs.   Family History:  The patient's  family history includes Atrial fibrillation in his mother; Heart attack  (age of onset: 4455) in his father.    ROS:  Please see the history of present illness.   All other systems are reviewed and negative.    PHYSICAL EXAM: VS:  BP 140/78   Pulse (!) 56   Ht 5\' 11"  (1.803 m)   Wt 261 lb 6.4 oz (118.6 kg)   BMI 36.46 kg/m  , BMI Body mass index is 36.46 kg/m. GEN: Well nourished, well developed, in no acute distress  HEENT: normal  Neck: no JVD, carotid bruits, or masses Cardiac: RRR; no murmurs, rubs, or gallops,no edema  Respiratory:  clear to auscultation bilaterally, normal work of breathing GI: soft, nontender, nondistended, + BS MS: no deformity or atrophy  Skin: warm and dry  Neuro:  Strength and sensation are intact Psych: euthymic mood, full affect  EKG:  EKG is ordered today. The ekg ordered today shows sinus rhythm, 56 bpm, Qtc 409 msec, otherwise normal ekg   Recent Labs: 04/09/2015: ALT 24    Lipid Panel     Component Value Date/Time   CHOL 138 04/09/2015 0839   TRIG 122 04/09/2015 0839   HDL 49 04/09/2015 0839   CHOLHDL 2.8 04/09/2015 0839   VLDL 24 04/09/2015 0839   LDLCALC 65 04/09/2015 0839     Wt Readings from Last 3 Encounters:  02/02/16 261 lb 6.4 oz (118.6 kg)  09/12/15 255 lb 8 oz (115.9 kg)  06/18/15 259 lb 9.6 oz (117.8 kg)     ASSESSMENT AND PLAN:  1.  paroxysmal atrial fibrillation chads2vasc score is 3.  Continue eliquis long term Doing well s/p ablatin off of AAD therapy but with several short recurrent events. No changes today I had a long and very frank discussion today about the results of Arrest AF and the importance of weight loss/ regular exercise in preventing recurrent episodes.  He will attempt to make lifestyle change.  2. htn Stable No change required today  3. OSA Compliance with CPAP is advised  4. Obesity Body mass index is 36.46 kg/m. Regular exercise, weight reduction, and lifestyle modification discussed at length today.  5. CAD No ischemic symptoms No changes  today  Current medicines are reviewed at length with the patient today.   The patient does not have concerns regarding his medicines.  The following changes were made today:  none  Follow-up with me in 3 months  Signed, Hillis RangeJames Alondra Vandeven, MD  02/02/2016 5:08 PM     [facility] [address] [place] [address] [phone] (office) [phone] (fax)

## 2016-02-02 NOTE — Patient Instructions (Signed)
Medication Instructions:  Your physician recommends that you continue on your current medications as directed. Please refer to the Current Medication list given to you today.  Labwork: None ordered.  Testing/Procedures: None ordered.  Follow-Up: Your physician wants you to follow-up in: 3 months with Dr. Allred.      Any Other Special Instructions Will Be Listed Below (If Applicable).  If you need a refill on your cardiac medications before your next appointment, please call your pharmacy.   

## 2016-02-06 ENCOUNTER — Other Ambulatory Visit: Payer: Self-pay | Admitting: Cardiovascular Disease

## 2016-02-06 MED ORDER — PITAVASTATIN CALCIUM 4 MG PO TABS: 4.0000 mg | ORAL_TABLET | Freq: Every day | ORAL | 5 refills | Status: DC

## 2016-02-06 NOTE — Telephone Encounter (Signed)
Rx request sent to pharmacy.  

## 2016-04-30 ENCOUNTER — Encounter: Payer: Self-pay | Admitting: Internal Medicine

## 2016-05-06 ENCOUNTER — Ambulatory Visit (INDEPENDENT_AMBULATORY_CARE_PROVIDER_SITE_OTHER): Payer: BLUE CROSS/BLUE SHIELD | Admitting: Internal Medicine

## 2016-05-06 ENCOUNTER — Encounter: Payer: Self-pay | Admitting: Internal Medicine

## 2016-05-06 VITALS — BP 122/68 | HR 79 | Ht 71.0 in | Wt 272.0 lb

## 2016-05-06 DIAGNOSIS — I10 Essential (primary) hypertension: Secondary | ICD-10-CM | POA: Diagnosis not present

## 2016-05-06 DIAGNOSIS — G4733 Obstructive sleep apnea (adult) (pediatric): Secondary | ICD-10-CM

## 2016-05-06 DIAGNOSIS — I480 Paroxysmal atrial fibrillation: Secondary | ICD-10-CM

## 2016-05-06 NOTE — Progress Notes (Signed)
Electrophysiology Office Note   Date:  05/06/2016   ID:  Dennis King, DOB [birth year], MRN [id number]  PCP:  Ethel RanaHEPLER,MARK, PA-C  Cardiologist:  Dr Allyson SabalBerry Primary Electrophysiologist: Hillis RangeJames Bernadean Saling, MD    Chief Complaint  Patient presents with  . Atrial Fibrillation     History of Present Illness: Dennis King is a 65 y.o. male who presents today for electrophysiology evaluation.  He has done well since his last visit.  AF is well controlled.  Unfortunately, he has not had much progress with weight reduction/ lifestyle modification.  Today, he denies symptoms of chest pain, shortness of breath, orthopnea, PND, lower extremity edema, claudication, dizziness, presyncope, syncope, bleeding, or neurologic sequela. The patient is tolerating medications without difficulties and is otherwise without complaint today.    Past Medical History:  Diagnosis Date  . Arthritis   . Coronary artery disease    past post circumflex obtuse marginal branch cutting balloon atherectomy by Dr. Eldridge DaceVaranasi  . Family history of heart disease   . Hyperlipidemia   . Hypertension   . Obesity   . Obstructive sleep apnea    on C Pap  . Paroxysmal a-fib (HCC)   . Type 2 diabetes mellitus (HCC)    Past Surgical History:  Procedure Laterality Date  . APPENDECTOMY    . CARDIAC CATHETERIZATION N/A 10/22/2014   Procedure: Left Heart Cath and Coronary Angiography;  Surgeon: Corky CraftsJayadeep S Varanasi, MD;  [facility];  Service: Cardiovascular;  Laterality: N/A;  . CARDIAC CATHETERIZATION Right 10/22/2014   Procedure: Coronary Stent Intervention;  Surgeon: Corky CraftsJayadeep S Varanasi, MD;  [facility];  Service: Cardiovascular;  Laterality: Right;  . ELECTROPHYSIOLOGIC STUDY N/A 01/31/2015   Procedure: Atrial Fibrillation Ablation;  Surgeon: Hillis RangeJames Chanin Frumkin, MD;  [facility];  Service: Cardiovascular;  Laterality: N/A;  . TEE WITHOUT CARDIOVERSION N/A 01/30/2015   Procedure:  TRANSESOPHAGEAL ECHOCARDIOGRAM (TEE);  Surgeon: Wendall StadePeter C Nishan, MD;  [facility];  Service: Cardiovascular;  Laterality: N/A;     Current Outpatient Prescriptions  Medication Sig Dispense Refill  . acetaminophen (TYLENOL) 325 MG tablet Take 2 tablets (650 mg total) by mouth every 4 (four) hours as needed for headache or mild pain.    Marland Kitchen. aspirin 81 MG tablet Take 81 mg by mouth daily.    . carvedilol (COREG) 25 MG tablet Take 25 mg by mouth 2 (two) times daily with a meal.     . diclofenac sodium (VOLTAREN) 1 % GEL Apply 2 g topically 4 (four) times daily.    Marland Kitchen. ELIQUIS 5 MG TABS tablet Take 1 tablet (5 mg total) by mouth 2 (two) times daily. 60 tablet 5  . INVOKANA 300 MG TABS tablet Take 1 tablet by mouth daily.  4  . JARDIANCE 25 MG TABS tablet Take 25 mg by mouth daily.     Marland Kitchen. lisinopril-hydrochlorothiazide (PRINZIDE,ZESTORETIC) 20-25 MG per tablet Take 1 tablet by mouth daily.     . metFORMIN (GLUCOPHAGE) 1000 MG tablet Take 1,000 mg by mouth 2 (two) times daily with a meal.     . Multiple Vitamins-Minerals (MULTIVITAMIN ADULT PO) Take 1 tablet by mouth daily.    . nitroGLYCERIN (NITROSTAT) 0.4 MG SL tablet Place 1 tablet (0.4 mg total) under the tongue every 5 (five) minutes as needed for chest pain. 25 tablet 2  . NON FORMULARY Cpap 8cm H2O    . NOVOLOG MIX 70/30 (70-30) 100 UNIT/ML injection as directed. Sliding scale  4  . Pitavastatin Calcium 4 MG TABS Take 1 tablet (4 mg total) by mouth daily. 30 tablet 5   No current facility-administered medications for this visit.     Allergies:   Statins and Tape   Social History:  The patient  reports that he has never smoked. He has never used smokeless tobacco. He reports that he drinks about 8.4 - 18.0 oz of alcohol per week . He reports that he does not use drugs.   Family History:  The patient's  family history includes Atrial fibrillation in his mother; Heart attack (age of onset: 5855) in his father.    ROS:  Please see the  history of present illness.   All other systems are reviewed and negative.    PHYSICAL EXAM: VS:  BP 122/68   Pulse 79   Ht 5\' 11"  (1.803 m)   Wt 272 lb (123.4 kg)   BMI 37.94 kg/m  , BMI Body mass index is 37.94 kg/m. GEN: Well nourished, well developed, in no acute distress  HEENT: normal  Neck: no JVD, carotid bruits, or masses Cardiac: RRR; no murmurs, rubs, or gallops,no edema  Respiratory:  clear to auscultation bilaterally, normal work of breathing GI: soft, nontender, nondistended, + BS MS: no deformity or atrophy  Skin: warm and dry  Neuro:  Strength and sensation are intact Psych: euthymic mood, full affect  EKG:  EKG is ordered today. The ekg ordered today shows sinus rhythm   Lipid Panel     Component Value Date/Time   CHOL 138 04/09/2015 0839   TRIG 122 04/09/2015 0839   HDL 49 04/09/2015 0839   CHOLHDL 2.8 04/09/2015 0839   VLDL 24 04/09/2015 0839   LDLCALC 65 04/09/2015 0839     Wt Readings from Last 3 Encounters:  05/06/16 272 lb (123.4 kg)  02/02/16 261 lb 6.4 oz (118.6 kg)  09/12/15 255 lb 8 oz (115.9 kg)     ASSESSMENT AND PLAN:  1.  paroxysmal atrial fibrillation chads2vasc score is 3.  Continue eliquis long term Maintaining sinus rhythm off of AAD therapy.  He does have short AF if he consumes heavy ETOh but has been able to avoid this recently. No changes today  2. htn Stable No change required today  3. OSA Compliant with CPAP.  He has not had his CPAP checked in quite some time.  He requests referral to Dr Earl Galasborne for further assessment which I agree is appropriate.  4. Obesity Body mass index is 37.94 kg/m. Regular exercise, weight reduction, and lifestyle modification discussed at length today.  5. CAD No ischemic symptoms No changes today  Current medicines are reviewed at length with the patient today.   The patient does not have concerns regarding his medicines.  The following changes were made today:  none  Follow-up  with me in 12 months  Signed, Hillis RangeJames Jocelyne Reinertsen, MD  05/06/2016 10:23 AM     [facility] [address] [place] [address] [phone] (office) [phone] (fax)

## 2016-05-06 NOTE — Patient Instructions (Signed)
Medication Instructions:  Your physician recommends that you continue on your current medications as directed. Please refer to the Current Medication list given to you today.   Labwork: None ordered   Testing/Procedures: None ordered   Follow-Up: You have been referred to Dr Earl Galasborne for sleep apnea management  Your physician wants you to follow-up in: 12 months with Dr Johney FrameAllred Bonita QuinYou will receive a reminder letter in the mail two months in advance. If you don't receive a letter, please call our office to schedule the follow-up appointment.    Any Other Special Instructions Will Be Listed Below (If Applicable).     If you need a refill on your cardiac medications before your next appointment, please call your pharmacy.

## 2016-07-06 ENCOUNTER — Other Ambulatory Visit: Payer: Self-pay | Admitting: Cardiovascular Disease

## 2016-07-20 ENCOUNTER — Other Ambulatory Visit: Payer: Self-pay | Admitting: Cardiovascular Disease

## 2016-07-20 NOTE — Telephone Encounter (Signed)
Rx(s) sent to pharmacy electronically.  

## 2016-10-13 ENCOUNTER — Other Ambulatory Visit: Payer: Self-pay | Admitting: Cardiovascular Disease

## 2016-10-13 NOTE — Telephone Encounter (Signed)
REFILL 

## 2016-11-23 ENCOUNTER — Other Ambulatory Visit: Payer: Self-pay

## 2016-11-23 MED ORDER — NITROGLYCERIN 0.4 MG SL SUBL: 0.4000 mg | SUBLINGUAL_TABLET | SUBLINGUAL | 2 refills | Status: DC | PRN

## 2017-03-14 ENCOUNTER — Ambulatory Visit (INDEPENDENT_AMBULATORY_CARE_PROVIDER_SITE_OTHER): Payer: Self-pay | Admitting: Orthopaedic Surgery

## 2017-03-15 ENCOUNTER — Ambulatory Visit (INDEPENDENT_AMBULATORY_CARE_PROVIDER_SITE_OTHER): Payer: Self-pay | Admitting: Orthopaedic Surgery

## 2017-03-21 ENCOUNTER — Ambulatory Visit (INDEPENDENT_AMBULATORY_CARE_PROVIDER_SITE_OTHER): Payer: Medicare Other | Admitting: Orthopaedic Surgery

## 2017-03-21 ENCOUNTER — Ambulatory Visit (INDEPENDENT_AMBULATORY_CARE_PROVIDER_SITE_OTHER): Payer: Medicare Other

## 2017-03-21 DIAGNOSIS — M25562 Pain in left knee: Secondary | ICD-10-CM

## 2017-03-21 DIAGNOSIS — G8929 Other chronic pain: Secondary | ICD-10-CM

## 2017-03-21 MED ORDER — LIDOCAINE HCL 1 % IJ SOLN
3.0000 mL | INTRAMUSCULAR | Status: AC | PRN
  Administered 2017-03-21: 3 mL

## 2017-03-21 MED ORDER — METHYLPREDNISOLONE ACETATE 40 MG/ML IJ SUSP
40.0000 mg | INTRAMUSCULAR | Status: AC | PRN
  Administered 2017-03-21: 40 mg via INTRA_ARTICULAR

## 2017-03-21 NOTE — Progress Notes (Signed)
Office Visit Note   Patient: Dennis King           Date of Birth: 09-24-1950           MRN: [id number] Visit Date: 03/21/2017              Requested by: Terri Piedra, PA-C [id number] [place], [place] [address] PCP: Lovenia Kim, PA-C   Assessment & Plan: Visit Diagnoses:  1. Chronic pain of left knee     Plan: Based on his clinical exam and signs and symptoms this may be the early onset of arthritic changes.  However his plain films look great.  I talked about an aspiration of the knee and a steroid injection and gave him a handout on hyaluronic acid.  I was able to aspirate 20 tolerated the injection well.  I gave him a handout on hyaluronic acid.  I showed him quad strengthening exercise to try as well. to 30 cc of clear yellow fluid from the knee and this does not appear consistent with gout is more consistent with arthritic changes.  I would like to see him back in 4 weeks and if he is having good outcome will consider a Ogrodnick acid.  However he if the knee does swell again and have another effusion I would like to obtain an MRI based on his normal plain films.  All questions and concerns were answered and addressed.  Follow-Up Instructions: Return in about 4 weeks (around 04/18/2017).   Orders:  Orders Placed This Encounter  Procedures  . Large Joint Injection/Arthrocentesis  . XR Knee 1-2 Views Left   No orders of the defined types were placed in this encounter.     Procedures: Large Joint Inj Date/Time: 03/21/2017 8:59 AM Performed by: Kathryne Hitch Authorized by: Kathryne Hitch   Location:  Knee Site:  L knee Ultrasound Guidance: No   Fluoroscopic Guidance: No   Arthrogram: No   Medications:  3 mL lidocaine 1 %; 40 mg methylPREDNISolone acetate 40 MG/ML     Clinical Data: No additional findings.   Subjective: No chief complaint on file. The patient comes in today with chief complaint of left knee pain is been swelling for  about 8 months now with no known injury.  She is gets worse and worse with time and is pretty constant ache.  He has been sitting for too long or standing too long gets painful and he can walk it off and it feels better.  He denies any locking catching.  He is tried some topical anti-inflammatories because he cannot take oral anti-inflammatories since he is only blood thinner Eliquis.  HPI  Review of Systems He currently denies any headache, chest pain, shortness of breath, fever, chills, nausea, vomiting.   Objective: Vital Signs: There were no vitals taken for this visit.  Physical Exam Is alert and oriented x3 and in no acute distress Ortho Exam Examination of both his knees show excellent range of motion with no ligamentous instability.  His left knee that is the painful knee does have a slight effusion.  It is slightly warm as well. Specialty Comments:  No specialty comments available.  Imaging: Xr Knee 1-2 Views Left  Result Date: 03/21/2017 2 views of the left knee show well-maintained joint space and no acute findings.  There is no significant malalignment.  There is a mild effusion.    PMFS History: Patient Active Problem List   Diagnosis Date Noted  . A-fib (HCC) 01/31/2015  .  Morbid obesity (HCC) 12/18/2014  . CAD S/P ostial OM2 POBA 10/23/2014  . NSTEMI (non-ST elevated myocardial infarction) (HCC)   . PAF- CHADS VASc= 3 (HTN, DM, CAD).  10/20/2014  . Elevated troponin 10/20/2014  . Congestive dilated cardiomyopathy (HCC) 10/20/2014  . Essential hypertension 09/20/2014  . Hyperlipidemia 09/20/2014  . Diabetes (HCC) 09/20/2014  . Palpitations 09/20/2014  . Obstructive sleep apnea-on C-pap 09/20/2014   Past Medical History:  Diagnosis Date  . Arthritis   . Coronary artery disease    past post circumflex obtuse marginal branch cutting balloon atherectomy by Dr. Eldridge DaceVaranasi  . Family history of heart disease   . Hyperlipidemia   . Hypertension   . Obesity   .  Obstructive sleep apnea    on C Pap  . Paroxysmal a-fib (HCC)   . Type 2 diabetes mellitus (HCC)     Family History  Problem Relation Age of Onset  . Heart attack Father 2455       Died age 65  . Atrial fibrillation Mother     Past Surgical History:  Procedure Laterality Date  . APPENDECTOMY    . CARDIAC CATHETERIZATION N/A 10/22/2014   Procedure: Left Heart Cath and Coronary Angiography;  Surgeon: Corky CraftsJayadeep S Varanasi, MD;  [facility];  Service: Cardiovascular;  Laterality: N/A;  . CARDIAC CATHETERIZATION Right 10/22/2014   Procedure: Coronary Stent Intervention;  Surgeon: Corky CraftsJayadeep S Varanasi, MD;  [facility];  Service: Cardiovascular;  Laterality: Right;  . ELECTROPHYSIOLOGIC STUDY N/A 01/31/2015   Procedure: Atrial Fibrillation Ablation;  Surgeon: Hillis RangeJames Allred, MD;  [facility];  Service: Cardiovascular;  Laterality: N/A;  . TEE WITHOUT CARDIOVERSION N/A 01/30/2015   Procedure: TRANSESOPHAGEAL ECHOCARDIOGRAM (TEE);  Surgeon: Wendall StadePeter C Nishan, MD;  [facility];  Service: Cardiovascular;  Laterality: N/A;   Social History   Occupational History  . Not on file.   Social History Main Topics  . Smoking status: Never Smoker  . Smokeless tobacco: Never Used  . Alcohol use 8.4 - 18.0 oz/week    14 - 30 Glasses of wine per week     Comment: previously 6 glasses of wine per night, currently 2 glasses per night  . Drug use: No  . Sexual activity: Not on file

## 2017-04-18 ENCOUNTER — Encounter (INDEPENDENT_AMBULATORY_CARE_PROVIDER_SITE_OTHER): Payer: Self-pay | Admitting: Orthopaedic Surgery

## 2017-04-18 ENCOUNTER — Ambulatory Visit (INDEPENDENT_AMBULATORY_CARE_PROVIDER_SITE_OTHER): Payer: Medicare Other | Admitting: Orthopaedic Surgery

## 2017-04-18 DIAGNOSIS — M1712 Unilateral primary osteoarthritis, left knee: Secondary | ICD-10-CM | POA: Diagnosis not present

## 2017-04-18 MED ORDER — HYALURONAN 88 MG/4ML IX SOSY
88.0000 mg | PREFILLED_SYRINGE | INTRA_ARTICULAR | Status: AC | PRN
  Administered 2017-04-18: 88 mg via INTRA_ARTICULAR

## 2017-04-18 NOTE — Progress Notes (Signed)
   Procedure Note  Patient: Dennis LodgeRobert N Askari             Date of Birth: 1950/10/04           MRN: [id number]             Visit Date: 04/18/2017  Procedures: Visit Diagnoses: Unilateral primary osteoarthritis, left knee  Large Joint Inj: L knee on 04/18/2017 8:52 AM Indications: pain and diagnostic evaluation Details: 22 G 1.5 in needle, superolateral approach  Arthrogram: No  Medications: 88 mg Hyaluronan 88 MG/4ML Outcome: tolerated well, no immediate complications Procedure, treatment alternatives, risks and benefits explained, specific risks discussed. Consent was given by the patient. Immediately prior to procedure a time out was called to verify the correct patient, procedure, equipment, support staff and site/side marked as required. Patient was prepped and draped in the usual sterile fashion.     The patient is here for scheduled hyaluronic acid injection with Monovisc in his left knee to treat arthritic pain from known arthritis in the knee.  His x-rays still confirm an open joint space.  He does have recurrent effusion.  I was able to aspirate about 25 cc of clear yellow fluid off the knee.  He tolerated this well.  At this point we placed Monovisc in the knee without difficulty.  All questions and concerns were answered and addressed.  He understands we can always place a steroid injection in his knee 3 months down the road.  If his knee still continues to bother him on next step clinically would be an MRI given his open joint spaces from plain films.

## 2017-05-09 ENCOUNTER — Ambulatory Visit: Payer: BLUE CROSS/BLUE SHIELD | Admitting: Internal Medicine

## 2017-06-01 DIAGNOSIS — Z794 Long term (current) use of insulin: Secondary | ICD-10-CM | POA: Diagnosis not present

## 2017-06-01 DIAGNOSIS — E113293 Type 2 diabetes mellitus with mild nonproliferative diabetic retinopathy without macular edema, bilateral: Secondary | ICD-10-CM | POA: Diagnosis not present

## 2017-06-01 DIAGNOSIS — H2513 Age-related nuclear cataract, bilateral: Secondary | ICD-10-CM | POA: Diagnosis not present

## 2017-06-01 DIAGNOSIS — H43393 Other vitreous opacities, bilateral: Secondary | ICD-10-CM | POA: Diagnosis not present

## 2017-06-15 ENCOUNTER — Ambulatory Visit (INDEPENDENT_AMBULATORY_CARE_PROVIDER_SITE_OTHER): Payer: Medicare Other | Admitting: Internal Medicine

## 2017-06-15 ENCOUNTER — Encounter: Payer: Self-pay | Admitting: Internal Medicine

## 2017-06-15 VITALS — BP 126/64 | HR 70 | Ht 71.0 in | Wt 269.0 lb

## 2017-06-15 DIAGNOSIS — E7800 Pure hypercholesterolemia, unspecified: Secondary | ICD-10-CM | POA: Diagnosis not present

## 2017-06-15 DIAGNOSIS — I10 Essential (primary) hypertension: Secondary | ICD-10-CM | POA: Diagnosis not present

## 2017-06-15 DIAGNOSIS — I480 Paroxysmal atrial fibrillation: Secondary | ICD-10-CM | POA: Diagnosis not present

## 2017-06-15 DIAGNOSIS — G4733 Obstructive sleep apnea (adult) (pediatric): Secondary | ICD-10-CM

## 2017-06-15 NOTE — Progress Notes (Signed)
PCP: System, Pcp Not In Primary Cardiologist: Dr Allyson Sabal Primary EP: Dr [name]  Dennis King is a 67 y.o. male who presents today for routine electrophysiology followup.  Since last being seen in our clinic, the patient reports doing very well.  4 short episodes of afib in the past year, typically related to drinking wine or falling asleep on the couch without using his CPAP.  Otherwise doing well.  He is building a house.  Today, he denies symptoms of palpitations, chest pain, shortness of breath,  lower extremity edema, dizziness, presyncope, or syncope.  The patient is otherwise without complaint today.   Past Medical History:  Diagnosis Date  . Arthritis   . Coronary artery disease    past post circumflex obtuse marginal branch cutting balloon atherectomy by Dr. [name]  . Family history of heart disease   . Hyperlipidemia   . Hypertension   . Obesity   . Obstructive sleep apnea    on C Pap  . Paroxysmal A-fib (HCC)   . Type 2 diabetes mellitus (HCC)    Past Surgical History:  Procedure Laterality Date  . APPENDECTOMY    . CARDIAC CATHETERIZATION N/A 10/22/2014   Procedure: Left Heart Cath and Coronary Angiography;  Surgeon: Corky Crafts, MD;  Location: [facility] INVASIVE CV LAB;  Service: Cardiovascular;  Laterality: N/A;  . CARDIAC CATHETERIZATION Right 10/22/2014   Procedure: Coronary Stent Intervention;  Surgeon: Corky Crafts, MD;  Location: [facility] INVASIVE CV LAB;  Service: Cardiovascular;  Laterality: Right;  . ELECTROPHYSIOLOGIC STUDY N/A 01/31/2015   Procedure: Atrial Fibrillation Ablation;  Surgeon: [name], MD;  Location: [facility] INVASIVE CV LAB;  Service: Cardiovascular;  Laterality: N/A;  . TEE WITHOUT CARDIOVERSION N/A 01/30/2015   Procedure: TRANSESOPHAGEAL ECHOCARDIOGRAM (TEE);  Surgeon: Wendall Stade, MD;  Location: [facility] ENDOSCOPY;  Service: Cardiovascular;  Laterality: N/A;    ROS- all systems are reviewed and negatives except as per HPI above  Current  Outpatient Medications  Medication Sig Dispense Refill  . acetaminophen (TYLENOL) 325 MG tablet Take 2 tablets (650 mg total) by mouth every 4 (four) hours as needed for headache or mild pain.    Marland Kitchen aspirin 81 MG tablet Take 81 mg by mouth daily.    . carvedilol (COREG) 25 MG tablet Take 25 mg by mouth 2 (two) times daily with a meal.     . diclofenac sodium (VOLTAREN) 1 % GEL Apply 2 g topically 4 (four) times daily.    Marland Kitchen ELIQUIS 5 MG TABS tablet TAKE ONE TABLET BY MOUTH TWICE DAILY 60 tablet 5  . INVOKANA 300 MG TABS tablet Take 1 tablet by mouth daily.  4  . JARDIANCE 25 MG TABS tablet Take 25 mg by mouth daily.     Marland Kitchen lisinopril-hydrochlorothiazide (PRINZIDE,ZESTORETIC) 20-25 MG per tablet Take 1 tablet by mouth daily.     . metFORMIN (GLUCOPHAGE) 1000 MG tablet Take 1,000 mg by mouth 2 (two) times daily with a meal.     . Multiple Vitamins-Minerals (MULTIVITAMIN ADULT PO) Take 1 tablet by mouth daily.    . nitroGLYCERIN (NITROSTAT) 0.4 MG SL tablet Place 1 tablet (0.4 mg total) under the tongue every 5 (five) minutes as needed for chest pain. PLEASE MAKE APPOINTMENT 25 tablet 2  . NON FORMULARY Cpap 8cm H2O    . NOVOLOG MIX 70/30 (70-30) 100 UNIT/ML injection as directed. Sliding scale  4   No current facility-administered medications for this visit.     Physical Exam: Vitals:  06/15/17 1015  BP: 126/64  Pulse: 70  Weight: 269 lb (122 kg)  Height: 5\' 11"  (1.803 m)    GEN- The patient is obese appearing, alert and oriented x 3 today.   Head- normocephalic, atraumatic Eyes-  Sclera clear, conjunctiva pink Ears- hearing intact Oropharynx- clear Lungs- Clear to ausculation bilaterally, normal work of breathing Heart- Regular rate and rhythm, no murmurs, rubs or gallops, PMI not laterally displaced GI- soft, NT, ND, + BS Extremities- no clubbing, cyanosis, or edema  EKG tracing ordered today is personally reviewed and shows sinus rhythm 70 bpm, otherwise normal ekg  Assessment  and Plan:  1. Paroxysmal atrail fibrillation Well controlled ETOh avoidance encouraged On eliquis for chads2vasc score of 3  2.  OSA  Compliant with CPAP Followed by Dr Earl Galasborne  3. HTN Stable No change required today  4. Obesity Body mass index is 37.52 kg/m. Lifestyle modification encouraged  5. CAD No ischemic symptoms No changes  6. HL Statin intolerant Interested in other options Will refer for the Clear study If not a candidate, I have asked study coordinator to then send to Lipid clinic for consideration of psk9 inhibitor.  Return to see EP PA in a year Overdue to see Dr Gabriel RungBerry  Stephanne Greeley MD, [facility] Hospital[facility]ACC 06/15/2017 10:47 AM

## 2017-06-15 NOTE — Patient Instructions (Addendum)
Medication Instructions:  Your physician recommends that you continue on your current medications as directed. Please refer to the Current Medication list given to you today.      Labwork: None Ordered   Testing/Procedures: None Ordered   Follow-Up: Your physician wants you to follow-up in: 12 months with Francis Dowseenee Ursuy, PA                Please call our office to schedule the follow-up appointment.    Any Other Special Instructions Will Be Listed Below (If Applicable).     If you need a refill on your cardiac medications before your next appointment, please call your pharmacy.

## 2017-06-17 ENCOUNTER — Telehealth: Payer: Self-pay | Admitting: *Deleted

## 2017-06-17 DIAGNOSIS — E782 Mixed hyperlipidemia: Secondary | ICD-10-CM

## 2017-06-17 NOTE — Telephone Encounter (Signed)
Spoke with Dr. Jasmine Decemberglesby re: the Clear Research Study.  At this time he would rather pursue the PCSK9i, Repatha, route.  If that doesn't work out for him he would like to get back in touch with us.

## 2017-06-21 NOTE — Addendum Note (Signed)
Addended by: Trammell Bowden E on: 06/21/2017 09:44 AM   Modules accepted: Orders

## 2017-06-21 NOTE — Telephone Encounter (Addendum)
Pt has been scheduled in lipid clinic. Fasting lab work scheduled same day as well since pt has not had lipid panel since 2016 and baseline lipids will be needed in order to obtain PCSK9i coverage.

## 2017-06-30 ENCOUNTER — Ambulatory Visit: Payer: Medicare Other

## 2017-07-05 ENCOUNTER — Ambulatory Visit (INDEPENDENT_AMBULATORY_CARE_PROVIDER_SITE_OTHER): Payer: Medicare Other | Admitting: Pharmacist

## 2017-07-05 ENCOUNTER — Other Ambulatory Visit: Payer: Medicare Other

## 2017-07-05 DIAGNOSIS — E7800 Pure hypercholesterolemia, unspecified: Secondary | ICD-10-CM | POA: Diagnosis not present

## 2017-07-05 DIAGNOSIS — E782 Mixed hyperlipidemia: Secondary | ICD-10-CM

## 2017-07-05 LAB — LIPID PANEL
CHOL/HDL RATIO: 3.8 ratio (ref 0.0–5.0)
Cholesterol, Total: 199 mg/dL (ref 100–199)
HDL: 53 mg/dL (ref >39)
LDL CALC: 121 mg/dL — AB (ref 0–99)
TRIGLYCERIDES: 123 mg/dL (ref 0–149)
VLDL CHOLESTEROL CAL: 25 mg/dL (ref 5–40)

## 2017-07-05 NOTE — Patient Instructions (Signed)
We will send for coverage of Repatha pending your lab results.  We will call you once we have received coverage through the insurance and then we will send for coverage through [employer] (the drug company).   Once you have started the medication please call the clinic at [phone] so that labs can be scheduled.   Cholesterol Cholesterol is a fat. Your body needs a small amount of cholesterol. Cholesterol (plaque) may build up in your blood vessels (arteries). That makes you more likely to have a heart attack or stroke. You cannot feel your cholesterol level. Having a blood test is the only way to find out if your level is high. Keep your test results. Work with your doctor to keep your cholesterol at a good level. What do the results mean?  Total cholesterol is how much cholesterol is in your blood.  LDL is bad cholesterol. This is the type that can build up. Try to have low LDL.  HDL is good cholesterol. It cleans your blood vessels and carries LDL away. Try to have high HDL.  Triglycerides are fat that the body can store or burn for energy. What are good levels of cholesterol?  Total cholesterol below 200.  LDL below 100 is good for people who have health risks. LDL below 70 is good for people who have very high risks.  HDL above 40 is good. It is best to have HDL of 60 or higher.  Triglycerides below 150. How can I lower my cholesterol? Diet Follow your diet program as told by your doctor.  Choose fish, white meat chicken, or [place]key that is roasted or baked. Try not to eat red meat, fried foods, sausage, or lunch meats.  Eat lots of fresh fruits and vegetables.  Choose whole grains, beans, pasta, potatoes, and cereals.  Choose olive oil, corn oil, or canola oil. Only use small amounts.  Try not to eat butter, mayonnaise, shortening, or palm kernel oils.  Try not to eat foods with trans fats.  Choose low-fat or nonfat dairy foods. ? Drink skim or nonfat  milk. ? Eat low-fat or nonfat yogurt and cheeses. ? Try not to drink whole milk or cream. ? Try not to eat ice cream, egg yolks, or full-fat cheeses.  Healthy desserts include angel food cake, ginger snaps, animal crackers, hard candy, popsicles, and low-fat or nonfat frozen yogurt. Try not to eat pastries, cakes, pies, and cookies.  Exercise Follow your exercise program as told by your doctor.  Be more active. Try gardening, walking, and taking the stairs.  Ask your doctor about ways that you can be more active.  Medicine  Take over-the-counter and prescription medicines only as told by your doctor. This information is not intended to replace advice given to you by your health care provider. Make sure you discuss any questions you have with your health care provider. Document Released: 08/06/2008 Document Revised: 12/10/2015 Document Reviewed: 11/20/2015 Elsevier Interactive Patient Education  [employer]2018 Elsevier Inc.

## 2017-07-05 NOTE — Progress Notes (Signed)
Patient ID: Dennis LodgeRobert N Dimitrov                 DOB: 21-Sep-1950                    MRN: [id number]     HPI: Dennis King is a 67 y.o. male patient of Dr. Allred/Dr. Allyson SabalBerry that presents today for lipid follow up.  PMH includes HTN, OSA, DM, CAD with balloon angioplasty of 2nd obtuse marginal branch, AF. He was previously sent to CLEAR trial, but would prefer treatment with Repatha.   He presents today for lipid panel now off livalo and to discuss PCSK9i therapy.   Risk Factors: HTN, CAD with angioplasty, DM  LDL Goal: <70  Current Medications: none Intolerances: livalo 4mg  daily (stopped 4-5 months ago, stopped due to muscle aches and again after rechallenge), atorvastatin 20 mg, Crestor 20 mg and pravastatin 20 mg, all caused myalgias of arms and legs, pain does go away after stopping medication  Diet: his weight has fluctuated over the years, to a high of 290; currently his wife is trying to keep him on an Atkins like diet with low to no carbs.  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 Arthritis   . Coronary artery disease    past post circumflex obtuse marginal branch cutting balloon atherectomy by Dr. Eldridge DaceVaranasi  . Family history of heart disease   . Hyperlipidemia   . Hypertension   . Obesity   . Obstructive sleep apnea    on C Pap  . Paroxysmal A-fib (HCC)   . Type 2 diabetes mellitus (HCC)     Current Outpatient Medications on File Prior to Visit  Medication Sig Dispense Refill  . acetaminophen (TYLENOL) 325 MG tablet Take 2 tablets (650 mg total) by mouth every 4 (four)  hours as needed for headache or mild pain.    Marland Kitchen. aspirin 81 MG tablet Take 81 mg by mouth daily.    . carvedilol (COREG) 25 MG tablet Take 25 mg by mouth 2 (two) times daily with a meal.     . diclofenac sodium (VOLTAREN) 1 % GEL Apply 2 g topically 4 (four) times daily.    Marland Kitchen. ELIQUIS 5 MG TABS tablet TAKE ONE TABLET BY MOUTH TWICE DAILY 60 tablet 5  . INVOKANA 300 MG TABS tablet Take 1 tablet by mouth daily.  4  . JARDIANCE 25 MG TABS tablet Take 25 mg by mouth daily.     Marland Kitchen. lisinopril-hydrochlorothiazide (PRINZIDE,ZESTORETIC) 20-25 MG per tablet Take 1 tablet by mouth daily.     . metFORMIN (GLUCOPHAGE) 1000 MG tablet Take 1,000 mg by mouth 2 (two) times daily with a meal.     . Multiple Vitamins-Minerals (MULTIVITAMIN ADULT PO) Take 1 tablet by mouth daily.    . nitroGLYCERIN (NITROSTAT) 0.4 MG SL tablet Place 1 tablet (0.4 mg total) under the tongue every 5 (five) minutes as needed for chest pain. PLEASE MAKE APPOINTMENT 25 tablet 2  . NON FORMULARY Cpap 8cm H2O    .  NOVOLOG MIX 70/30 (70-30) 100 UNIT/ML injection as directed. Sliding scale  4   No current facility-administered medications on file prior to visit.     Allergies  Allergen Reactions  . Statins Other (See Comments)    Specifically Atorvastatin and Crestor - myalgia  . Livalo [Pitavastatin] Other (See Comments)    Myalgia   . Tape Rash and Other (See Comments)    Paper tape please.    Assessment/Plan: Hyperlipidemia: Labs drawn today after discontinuation of Livalo. If LDL above goal will pursue Repatha. Injection technique, side effects and benefits for Repatha discussed. Once approved pt would prefer to get medication through assistance. Once approved through insurance will send to [employer]. Pt aware to call once he has medication to have labs scheduled.    Thank you,  Freddie Apley. Cleatis Polka, PharmD  [facility]  07/05/2017 7:42 AM

## 2017-07-07 ENCOUNTER — Telehealth: Payer: Self-pay | Admitting: Pharmacist

## 2017-07-07 NOTE — Telephone Encounter (Signed)
Repatha approved through insurance. Info sent to [employer] for patient assistance as requested by patient. He is aware of lab results and above.

## 2017-07-20 DIAGNOSIS — E1142 Type 2 diabetes mellitus with diabetic polyneuropathy: Secondary | ICD-10-CM | POA: Diagnosis not present

## 2017-07-20 DIAGNOSIS — I10 Essential (primary) hypertension: Secondary | ICD-10-CM | POA: Diagnosis not present

## 2017-07-20 DIAGNOSIS — E11319 Type 2 diabetes mellitus with unspecified diabetic retinopathy without macular edema: Secondary | ICD-10-CM | POA: Diagnosis not present

## 2017-07-20 DIAGNOSIS — E7800 Pure hypercholesterolemia, unspecified: Secondary | ICD-10-CM | POA: Diagnosis not present

## 2017-07-20 DIAGNOSIS — Z794 Long term (current) use of insulin: Secondary | ICD-10-CM | POA: Diagnosis not present

## 2017-07-29 DIAGNOSIS — B349 Viral infection, unspecified: Secondary | ICD-10-CM | POA: Diagnosis not present

## 2017-07-29 DIAGNOSIS — J069 Acute upper respiratory infection, unspecified: Secondary | ICD-10-CM | POA: Diagnosis not present

## 2017-08-12 NOTE — Telephone Encounter (Signed)
Pt has been approved for Repatha financial assistance through the [employer]. He is aware and will call them to coordinate shipment. Advised pt to contact clinic once he starts with injections so that we can schedule follow up lab work.

## 2017-09-13 ENCOUNTER — Telehealth: Payer: Self-pay | Admitting: Pharmacist

## 2017-09-13 NOTE — Telephone Encounter (Signed)
LMOM for pt to see if he has started Repatha injections. Will need f/u lab work scheduled after 3 injections.

## 2017-10-20 DIAGNOSIS — G4733 Obstructive sleep apnea (adult) (pediatric): Secondary | ICD-10-CM | POA: Diagnosis not present

## 2017-10-20 DIAGNOSIS — E1169 Type 2 diabetes mellitus with other specified complication: Secondary | ICD-10-CM | POA: Diagnosis not present

## 2017-11-04 NOTE — Telephone Encounter (Signed)
LMTCB to schedule labs as follow up to Repatha

## 2018-01-04 NOTE — Telephone Encounter (Signed)
LMOM again to see if pt has started Repatha injections.

## 2018-01-04 NOTE — Telephone Encounter (Signed)
Pt returned call to clinic - he has been on Repatha shots for a few months. He sees his PCP at [facility] later this month and will have them draw his cholesterol.

## 2018-01-20 DIAGNOSIS — I10 Essential (primary) hypertension: Secondary | ICD-10-CM | POA: Diagnosis not present

## 2018-01-20 DIAGNOSIS — Z1211 Encounter for screening for malignant neoplasm of colon: Secondary | ICD-10-CM | POA: Diagnosis not present

## 2018-01-20 DIAGNOSIS — G4733 Obstructive sleep apnea (adult) (pediatric): Secondary | ICD-10-CM | POA: Diagnosis not present

## 2018-01-20 DIAGNOSIS — E1142 Type 2 diabetes mellitus with diabetic polyneuropathy: Secondary | ICD-10-CM | POA: Diagnosis not present

## 2018-01-20 DIAGNOSIS — E669 Obesity, unspecified: Secondary | ICD-10-CM | POA: Diagnosis not present

## 2018-01-20 DIAGNOSIS — E7800 Pure hypercholesterolemia, unspecified: Secondary | ICD-10-CM | POA: Diagnosis not present

## 2018-01-20 DIAGNOSIS — Z7984 Long term (current) use of oral hypoglycemic drugs: Secondary | ICD-10-CM | POA: Diagnosis not present

## 2018-02-20 ENCOUNTER — Telehealth: Payer: Self-pay | Admitting: Pharmacist

## 2018-02-20 NOTE — Telephone Encounter (Signed)
Pt had labs drawn at [place] since starting on Repatha.  Labs 01/20/18: TC 132, TG 71, HDL 69, LDL 48  Lipids now at goal, pt to continue on current lipid lowering therapy.

## 2018-03-28 DIAGNOSIS — R52 Pain, unspecified: Secondary | ICD-10-CM | POA: Diagnosis not present

## 2018-04-14 DIAGNOSIS — G8929 Other chronic pain: Secondary | ICD-10-CM | POA: Diagnosis not present

## 2018-04-14 DIAGNOSIS — M25571 Pain in right ankle and joints of right foot: Secondary | ICD-10-CM | POA: Diagnosis not present

## 2018-04-14 DIAGNOSIS — I2510 Atherosclerotic heart disease of native coronary artery without angina pectoris: Secondary | ICD-10-CM | POA: Diagnosis not present

## 2018-04-14 DIAGNOSIS — E669 Obesity, unspecified: Secondary | ICD-10-CM | POA: Diagnosis not present

## 2018-04-14 DIAGNOSIS — E7800 Pure hypercholesterolemia, unspecified: Secondary | ICD-10-CM | POA: Diagnosis not present

## 2018-04-14 DIAGNOSIS — G4733 Obstructive sleep apnea (adult) (pediatric): Secondary | ICD-10-CM | POA: Diagnosis not present

## 2018-04-14 DIAGNOSIS — E1142 Type 2 diabetes mellitus with diabetic polyneuropathy: Secondary | ICD-10-CM | POA: Diagnosis not present

## 2018-04-14 DIAGNOSIS — I10 Essential (primary) hypertension: Secondary | ICD-10-CM | POA: Diagnosis not present

## 2018-04-14 DIAGNOSIS — N529 Male erectile dysfunction, unspecified: Secondary | ICD-10-CM | POA: Diagnosis not present

## 2018-04-14 DIAGNOSIS — Z23 Encounter for immunization: Secondary | ICD-10-CM | POA: Diagnosis not present

## 2018-04-14 DIAGNOSIS — R32 Unspecified urinary incontinence: Secondary | ICD-10-CM | POA: Diagnosis not present

## 2018-05-05 ENCOUNTER — Telehealth: Payer: Self-pay

## 2018-05-05 NOTE — Telephone Encounter (Signed)
   [facility] Pre-operative Risk Assessment    Request for surgical clearance:  1. What type of surgery is being performed? SCREENING    2. When is this surgery scheduled? 06-12-18   3. What type of clearance is required (medical clearance vs. Pharmacy clearance to hold med vs. Both)? MEDICATION  4. Are there any medications that need to be held prior to surgery and how long? ELIQUIS   5. Practice name and name of physician performing surgery?  [facility]   6. What is your office phone number [phone]    7.   What is your office fax number [phone]  8.   Anesthesia type (None, local, MAC, general) ?  NOT LISTED   Waylan Rocher 05/05/2018, 5:04 PM  _________________________________________________________________   (provider comments below)

## 2018-05-06 NOTE — Telephone Encounter (Signed)
What procedure is being done? Screening colonoscopy?

## 2018-05-08 NOTE — Telephone Encounter (Signed)
See prior note from PharmD - need to know what the procedure is before we can provide anticoagulation clearance.

## 2018-05-08 NOTE — Telephone Encounter (Signed)
Pharm please address Eliquis thanks °

## 2018-05-08 NOTE — Telephone Encounter (Signed)
   Primary Cardiologist:[name] Allyson SabalBerry, MD  Chart reviewed as part of pre-operative protocol coverage. Because of [name] past medical history and time since last visit, he/she will require a follow-up visit in order to better assess preoperative cardiovascular risk.  Pre-op covering staff: - Please schedule appointment and call patient to inform them. - Please contact requesting surgeon's office via preferred method (i.e, phone, fax) to inform them of need for appointment prior to surgery.  If applicable, this message will also be routed to pharmacy pool and/or primary cardiologist for input on holding anticoagulant/antiplatelet agent as requested below so that this information is available at time of patient's appointment.   Nada BoozerLaura Ingold, NP  05/08/2018, 2:27 PM

## 2018-05-08 NOTE — Telephone Encounter (Signed)
Pt has been scheduled to see Joni ReiningKathryn Lawrence, NP, 05/22/18.

## 2018-05-11 NOTE — Telephone Encounter (Signed)
Per Britta MccreedyBarbara from requesting office, Pt is having a colonoscopy.

## 2018-05-11 NOTE — Telephone Encounter (Signed)
Pt takes Eliquis for afib with CHADS2VASc score of 4 (age, HTN, CAD, DM). Renal function is normal. Ok to hold Eliquis for 1-2 days prior to procedure.

## 2018-05-20 NOTE — Progress Notes (Signed)
Cardiology Office Note   Date:  05/22/2018   ID:  Dennis King, DOB 04/06/1951, MRN [id number]  PCP:  Henrine Screwshacker, Norberto, MD  Cardiologist: [name] Electrophysiologist: Dr. Jae DireAllred No chief complaint on file.    History of Present Illness: Dennis King is a 67 y.o. male who presents for ongoing assessment and management of coronary artery disease, with history of circumflex and obtuse marginal branch cutting balloon atherectomy, PAF (on Eliquis with CHADS VASC Score of 4), hyperlipidemia, hypertension, obesity, OSA on CPAP, and type 2 diabetes.  The patient was last seen by cardiology on 06/15/2017, when seen by Dr. Hillis RangeJames Allred for routine EP follow-up.    At that time the patient reported brief episodes of atrial fibrillation, typically related to drinking wine or falling asleep without using CPAP while lying on the couch.  At that time, the patient heart rate was well controlled, he was recommended to decrease alcohol use and to be more compliant with CPAP.  No medication changes or testing was planned at that time.  The patient is scheduled to have colonoscopy, and required cardiac evaluation and recommendations concerning Eliquis.  The patient was to hold Eliquis for 1 to 2 days prior to procedure.  He comes today for general cardiac evaluation in addition to clearance. He is without cardiac complaints.  He continues to have episodes of Afib when he drinks. He takes an extra dose of carvedilol if this occurs. He denies bleeding. He complains of a lot of arthritis pain in his knees and ankles, especially on the right.   Past Medical History:  Diagnosis Date  . Arthritis   . Coronary artery disease    past post circumflex obtuse marginal branch cutting balloon atherectomy by Dr. Eldridge DaceVaranasi  . Family history of heart disease   . Hyperlipidemia   . Hypertension   . Obesity   . Obstructive sleep apnea    on C Pap  . Paroxysmal A-fib (HCC)   . Type 2 diabetes mellitus (HCC)      Past Surgical History:  Procedure Laterality Date  . APPENDECTOMY    . CARDIAC CATHETERIZATION N/A 10/22/2014   Procedure: Left Heart Cath and Coronary Angiography;  Surgeon: Corky CraftsJayadeep S Varanasi, MD;  [facility];  Service: Cardiovascular;  Laterality: N/A;  . CARDIAC CATHETERIZATION Right 10/22/2014   Procedure: Coronary Stent Intervention;  Surgeon: Corky CraftsJayadeep S Varanasi, MD;  [facility];  Service: Cardiovascular;  Laterality: Right;  . ELECTROPHYSIOLOGIC STUDY N/A 01/31/2015   Procedure: Atrial Fibrillation Ablation;  Surgeon: Hillis RangeJames Allred, MD;  [facility];  Service: Cardiovascular;  Laterality: N/A;  . TEE WITHOUT CARDIOVERSION N/A 01/30/2015   Procedure: TRANSESOPHAGEAL ECHOCARDIOGRAM (TEE);  Surgeon: Wendall StadePeter C Nishan, MD;  [facility];  Service: Cardiovascular;  Laterality: N/A;     Current Outpatient Medications  Medication Sig Dispense Refill  . acetaminophen (TYLENOL) 325 MG tablet Take 2 tablets (650 mg total) by mouth every 4 (four) hours as needed for headache or mild pain.    Marland Kitchen. aspirin 81 MG tablet Take 81 mg by mouth daily.    . carvedilol (COREG) 25 MG tablet Take 25 mg by mouth 2 (two) times daily with a meal.     . diclofenac sodium (VOLTAREN) 1 % GEL Apply 2 g topically 4 (four) times daily.    Marland Kitchen. ELIQUIS 5 MG TABS tablet TAKE ONE TABLET BY MOUTH TWICE DAILY 60 tablet 5  . Evolocumab (REPATHA) 140 MG/ML SOSY Inject  1 pen into the skin every 14 (fourteen) days.    . INVOKANA 300 MG TABS tablet Take 1 tablet by mouth daily.  4  . JARDIANCE 25 MG TABS tablet Take 25 mg by mouth daily.     Marland Kitchen. lisinopril-hydrochlorothiazide (PRINZIDE,ZESTORETIC) 20-25 MG per tablet Take 1 tablet by mouth daily.     . metFORMIN (GLUCOPHAGE) 1000 MG tablet Take 1,000 mg by mouth 2 (two) times daily with a meal.     . Multiple Vitamins-Minerals (MULTIVITAMIN ADULT PO) Take 1 tablet by mouth daily.    . NON FORMULARY Cpap 8cm H2O    .  NOVOLOG MIX 70/30 (70-30) 100 UNIT/ML injection as directed. Sliding scale  4  . nitroGLYCERIN (NITROSTAT) 0.4 MG SL tablet Place 1 tablet (0.4 mg total) under the tongue every 5 (five) minutes as needed for chest pain. PLEASE MAKE APPOINTMENT (Patient not taking: Reported on 05/22/2018) 25 tablet 2   No current facility-administered medications for this visit.     Allergies:   Statins; Livalo [pitavastatin]; and Tape    Social History:  The patient  reports that he has never smoked. He has never used smokeless tobacco. He reports current alcohol use of about 14.0 - 30.0 standard drinks of alcohol per week. He reports that he does not use drugs.   Family History:  The patient's family history includes Atrial fibrillation in his mother; Heart attack (age of onset: 355) in his father.    ROS: All other systems are reviewed and negative. Unless otherwise mentioned in H&P    PHYSICAL EXAM: VS:  BP (!) 110/50   Pulse 72   Ht 5' 10.5" (1.791 m)   Wt 265 lb 6.4 oz (120.4 kg)   BMI 37.54 kg/m  , BMI Body mass index is 37.54 kg/m. GEN: Well nourished, well developed, in no acute distress, obese HEENT: normal Neck: no JVD, carotid bruits, or masses Cardiac: RRR; no murmurs, rubs, or gallops,no edema  Respiratory:  Clear to auscultation bilaterally, normal work of breathing GI: soft, nontender, nondistended, + BS MS: no deformity or atrophy Skin: warm and dry, no rash Neuro:  Strength and sensation are intact Psych: euthymic mood, full affect   EKG:  NSR rate of 72 bpm.   Recent Labs: No results found for requested labs within last 8760 hours.    Lipid Panel    Component Value Date/Time   CHOL 199 07/05/2017 0947   TRIG 123 07/05/2017 0947   HDL 53 07/05/2017 0947   CHOLHDL 3.8 07/05/2017 0947   CHOLHDL 2.8 04/09/2015 0839   VLDL 24 04/09/2015 0839   LDLCALC 121 (H) 07/05/2017 0947      Wt Readings from Last 3 Encounters:  05/22/18 265 lb 6.4 oz (120.4 kg)  06/15/17 269  lb (122 kg)  05/06/16 272 lb (123.4 kg)      Other studies Reviewed: NM Stress Test 10/10/2014 Study Highlights    Myocardial perfusion is normal. This is a high risk study, due to severely decreased LV function. Overall left ventricular systolic function was abnormal. The left ventricular ejection fraction is moderately decreased (34%) with global hypokinesis. A-fib was noted during the study. There are no significant changes in comparison to the prior study.   Cardiac Cath 10/22/2014 Conclusion    Ost 2nd Mrg to 2nd Mrg lesion, 95% stenosed. Scoring balloon angioplasty was performed with excellent result and no visible dissection. There is a 10% residual stenosis post intervention.  Low normal LV systolic function.  Given his atrial fibrillation and need for anticoagulation, we elected to treat this ostial obtuse marginal lesion with scoring balloon angioplasty only. The patient did have anginal symptoms with balloon inflations. There is an excellent angiographic result. Tirofiban to continue for a few hours. We started clopidogrel post procedure.  This could be continued for 2 weeks, since no stent was placed. I encouraged the patient to start Eliquis for stroke prevention. He is still deciding but I think he will agree eventually to take the Eliquis. Continue management of atrial fibrillation. Consider rhythm control.   Echocardiogram 10/10/2014 Left ventricle: The cavity size was normal. Wall thickness was   normal. Systolic function was mildly reduced. The estimated   ejection fraction was in the range of 45% to 50%. Diffuse   hypokinesis. - Mitral valve: Calcified annulus.  Impressions:  - Technically difficult; EF difficult to quantitate as endocardium   not well visualized and patient tachycardic; appears mildly   reduced; suggest FU study when HR better controlled.  ASSESSMENT AND PLAN:  1. PAF: Patient is without complaints of chest pain, DOE or bleeding. HR is in NSR  today. He occasionally takes an extra dose of carvedilol if his HR becomes irregular when he drinks.   2. Pre-Operative evaluation:  Chart reviewed as part of pre-operative protocol coverage. Given past medical history and time since last visit, based on ACC/AHA guidelines, BACILIO ABASCAL would be at acceptable risk for the planned colonoscopywithout further cardiovascular testing. He is to hold ASA and Eliquis 2 days prior to procedure scheduled on 06/12/2018   3. Hyperlipidemia: Currently on Repatha. Labs are being followed by [facility]. I have checked for labs that have been drawn 2 months ago, per patient's report, he does not have Lipids listed. He is to follow up with our pharmacist with Dr. [name].   Current medicines are reviewed at length with the patient today.    Labs/ tests ordered today include: None  Bettey Mare. Liborio Nixon, ANP, [facility]   05/22/2018 8:23 AM    [facility] [address] Office [phone] Fax [phone]

## 2018-05-22 ENCOUNTER — Ambulatory Visit (INDEPENDENT_AMBULATORY_CARE_PROVIDER_SITE_OTHER): Payer: Medicare Other | Admitting: Adult Health

## 2018-05-22 ENCOUNTER — Encounter: Payer: Self-pay | Admitting: Adult Health

## 2018-05-22 VITALS — BP 110/50 | HR 72 | Ht 70.5 in | Wt 265.4 lb

## 2018-05-22 DIAGNOSIS — I480 Paroxysmal atrial fibrillation: Secondary | ICD-10-CM | POA: Diagnosis not present

## 2018-05-22 DIAGNOSIS — Z01810 Encounter for preprocedural cardiovascular examination: Secondary | ICD-10-CM

## 2018-05-22 DIAGNOSIS — E7800 Pure hypercholesterolemia, unspecified: Secondary | ICD-10-CM | POA: Diagnosis not present

## 2018-05-22 DIAGNOSIS — I10 Essential (primary) hypertension: Secondary | ICD-10-CM | POA: Diagnosis not present

## 2018-05-22 NOTE — Patient Instructions (Signed)
Medication Instructions:  HOLD ELIQUIS AND ASPIRIN 2 DAYS PRIOR TO COLONOSCOPY If you need a refill on your cardiac medications before your next appointment, please call your pharmacy.  Labwork: NONE ORDERED If you have labs (blood work) drawn today and your tests are completely normal, you will receive your results only by MyChart Message (if you have MyChart) -OR- A paper copy in the mail.  If you have any lab test that is abnormal or we need to change your treatment, we will call you to review these results.  Special Instructions: CLEARED FOR COLONOSCOPY-DR OUTLAW @ [facility]  Follow-Up: You will need a follow up appointment in 6 months.  Please call our office 2 months in advance to schedule this appointment.  You may see Nanetta BattyJonathan Berry, MD Joni ReiningKathryn Lawrence, DNP, AACC  or one of the following Advanced Practice Providers on your designated Care Team:  Corine ShelterLuke Kilroy, PA-C   Judy PimpleKrista Kroeger, PA-C  Marjie Skiffallie Goodrich, [place]PA-C   At [facility], you and your health needs are our priority.  As part of our continuing mission to provide you with exceptional heart care, we have created designated Provider Care Teams.  These Care Teams include your primary Cardiologist (physician) and Advanced Practice Providers (APPs -  Physician Assistants and Nurse Practitioners) who all work together to provide you with the care you need, when you need it.

## 2018-05-30 ENCOUNTER — Encounter (INDEPENDENT_AMBULATORY_CARE_PROVIDER_SITE_OTHER): Payer: Self-pay | Admitting: Orthopaedic Surgery

## 2018-05-30 ENCOUNTER — Ambulatory Visit (INDEPENDENT_AMBULATORY_CARE_PROVIDER_SITE_OTHER): Payer: Medicare Other | Admitting: Orthopaedic Surgery

## 2018-05-30 ENCOUNTER — Ambulatory Visit (INDEPENDENT_AMBULATORY_CARE_PROVIDER_SITE_OTHER): Payer: Medicare Other

## 2018-05-30 DIAGNOSIS — M25571 Pain in right ankle and joints of right foot: Secondary | ICD-10-CM

## 2018-05-30 NOTE — Addendum Note (Signed)
Addended by: Elvina Mattes T on: 05/30/2018 10:03 AM   Modules accepted: Orders

## 2018-05-30 NOTE — Progress Notes (Signed)
Office Visit Note   Patient: Dennis King           Date of Birth: 07-07-1950           MRN: [id number] Visit Date: 05/30/2018              Requested by: Dennis Scales, PA-C [address] [place], [place] [address] PCP: Dennis Screws, MD   Assessment & Plan: Visit Diagnoses:  1. Pain in right ankle and joints of right foot     Plan: We will obtain an MRI of his right ankle to rule out osteochondral lesion and have him follow-up for review of this study and further treatment.  In the interim he will continue his ankle braces.  Questions were encouraged and answered at length.  Follow-Up Instructions: Return for After MRI.   Orders:  Orders Placed This Encounter  Procedures  . XR Ankle Complete Right   No orders of the defined types were placed in this encounter.     Procedures: No procedures performed   Clinical Data: No additional findings.   Subjective: Chief Complaint  Patient presents with  . Right Ankle - Pain    HPI Dennis King comes in today with right ankle pain is been getting progressively worse since last summer.  He has had no acute injury.  History of multiple ankle sprains over the years he states that his ankles have "turned easily".  He usually wears a brace for the ankle after these events and the swelling goes down.  Describes the pain as a sharp stinging being like stinging pain anterior lateral aspect of the ankle.  He is tried diclofenac and naproxen.  He is on Eliquis has a history of A. fib, congestive dilated cardiomyopathy and coronary artery disease.  He is also diabetic states he has some neuropathy in both feet.  Reports good glucose control with a hemoglobin A1c around 6.  Has had diabetes for some 15 years.  Pain in the right ankle does awaken him at night.  Review of Systems Please see HPI otherwise negative  Objective: Vital Signs: There were no vitals taken for this visit.  Physical Exam Constitutional:      Appearance:  Normal appearance. He is not ill-appearing or diaphoretic.  Pulmonary:     Effort: Pulmonary effort is normal.  Skin:    General: Skin is warm and dry.  Neurological:     Mental Status: He is alert and oriented to person, place, and time.  Psychiatric:        Mood and Affect: Mood normal.     Ortho Exam Right ankle no rashes skin lesions ulcerations ecchymosis.  Positive edema compared to the left ankle.  Calves are supple bilaterally.  Achilles are intact bilaterally nontender.  Tenderness at the right sinus Tarsi region and over the distal right fibula.  No pain with dorsiflexion plantarflexion of motion of both ankles.  Inversion eversion 5 out of 5 strength bilaterally.  Nontender over the posterior tibial tendons peroneal tendons bilaterally.  Dorsal pedal pulses are present bilaterally.  Arch fatigue of the right compared to left.  No significant abnormal warmth right foot compared to left.  Specialty Comments:  No specialty comments available.  Imaging: Xr Ankle Complete Right  Result Date: 05/30/2018 3 views right ankle: Talus well located within the ankle mortise no diastases.  Anterior and posterior tibial is talar spurs mild to moderate arthritic changes medial gutter with ossifications off the medial  malleolus from previous injury.  No acute fractures.  Limited views of the foot showed no obvious Charcot changes of the foot or ankle.  Large calcaneal spur present.  Arthrosclerosis changes noted.    PMFS History: Patient Active Problem List   Diagnosis Date Noted  . A-fib (HCC) 01/31/2015  . Morbid obesity (HCC) 12/18/2014  . CAD S/P ostial OM2 POBA 10/23/2014  . NSTEMI (non-ST elevated myocardial infarction) (HCC)   . PAF- CHADS VASc= 3 (HTN, DM, CAD).  10/20/2014  . Elevated troponin 10/20/2014  . Congestive dilated cardiomyopathy (HCC) 10/20/2014  . Essential hypertension 09/20/2014  . Hyperlipidemia 09/20/2014  . Diabetes (HCC) 09/20/2014  . Palpitations 09/20/2014   . Obstructive sleep apnea-on C-pap 09/20/2014   Past Medical History:  Diagnosis Date  . Arthritis   . Coronary artery disease    past post circumflex obtuse marginal branch cutting balloon atherectomy by Dr. [name]  . Family history of heart disease   . Hyperlipidemia   . Hypertension   . Obesity   . Obstructive sleep apnea    on C Pap  . Paroxysmal A-fib (HCC)   . Type 2 diabetes mellitus (HCC)     Family History  Problem Relation Age of Onset  . Heart attack Father 49       Died age 73  . Atrial fibrillation Mother     Past Surgical History:  Procedure Laterality Date  . APPENDECTOMY    . CARDIAC CATHETERIZATION N/A 10/22/2014   Procedure: Left Heart Cath and Coronary Angiography;  Surgeon: Corky Crafts, MD;  Location: [facility] INVASIVE CV LAB;  Service: Cardiovascular;  Laterality: N/A;  . CARDIAC CATHETERIZATION Right 10/22/2014   Procedure: Coronary Stent Intervention;  Surgeon: Corky Crafts, MD;  Location: [facility] INVASIVE CV LAB;  Service: Cardiovascular;  Laterality: Right;  . ELECTROPHYSIOLOGIC STUDY N/A 01/31/2015   Procedure: Atrial Fibrillation Ablation;  Surgeon: [name], MD;  Location: [facility] INVASIVE CV LAB;  Service: Cardiovascular;  Laterality: N/A;  . TEE WITHOUT CARDIOVERSION N/A 01/30/2015   Procedure: TRANSESOPHAGEAL ECHOCARDIOGRAM (TEE);  Surgeon: Wendall Stade, MD;  Location: [facility] ENDOSCOPY;  Service: Cardiovascular;  Laterality: N/A;   Social History   Occupational History  . Not on file  Tobacco Use  . Smoking status: Never Smoker  . Smokeless tobacco: Never Used  Substance and Sexual Activity  . Alcohol use: Yes    Alcohol/week: 14.0 - 30.0 standard drinks    Types: 14 - 30 Glasses of wine per week    Comment: previously 6 glasses of wine per night, currently 2 glasses per night  . Drug use: No  . Sexual activity: Not on file

## 2018-06-05 ENCOUNTER — Ambulatory Visit (INDEPENDENT_AMBULATORY_CARE_PROVIDER_SITE_OTHER): Payer: Medicare Other

## 2018-06-05 DIAGNOSIS — M25571 Pain in right ankle and joints of right foot: Secondary | ICD-10-CM | POA: Diagnosis not present

## 2018-06-05 DIAGNOSIS — M19071 Primary osteoarthritis, right ankle and foot: Secondary | ICD-10-CM

## 2018-06-08 ENCOUNTER — Encounter (INDEPENDENT_AMBULATORY_CARE_PROVIDER_SITE_OTHER): Payer: Self-pay | Admitting: Orthopaedic Surgery

## 2018-06-08 ENCOUNTER — Ambulatory Visit (INDEPENDENT_AMBULATORY_CARE_PROVIDER_SITE_OTHER): Payer: Medicare Other | Admitting: Orthopaedic Surgery

## 2018-06-08 DIAGNOSIS — M7671 Peroneal tendinitis, right leg: Secondary | ICD-10-CM

## 2018-06-08 NOTE — Progress Notes (Signed)
Patient is a very pleasant 68 year old gentleman who is here today to go over an MRI of his right ankle.  He has had a history of multiple ankle sprains and chronic lateral ankle pain.  We sent her for an MRI to assess the cartilage in his ankle as well as the lateral ankle structures.  On exam his pain is consistent with pain over the lateral aspect of the ankle at the anterior talofibular ligament and the peroneal tendons.  He has good foot eversion and dorsiflexion.  There is no medial instability or medial pain.  The MRI does show moderate tendinosis of the peroneus longus tendon.  There is no tendon tear but there is certainly inflamed tissue within the tendon sheath of the longus and peroneal brevis tendons.  There is a moderate amount of fluid.  There is attenuation of the anterior talofibular ligament.  There is no osteochondral defect in the ankle joint itself and the remainder of the posterior structures including the posterior tibial tendon are all intact.  The Achilles is intact.  I shared with him the findings of the MRI.  This is certainly chronic tendinosis of the peroneal tendons.  We will try to put him in a cam walking boot and shut the tendons down completely for period of about 4 to 6 weeks to see if we get things to calm down.  We will reevaluate him then with a clinical exam.  All question concerns were answered and addressed.

## 2018-06-12 DIAGNOSIS — Z1211 Encounter for screening for malignant neoplasm of colon: Secondary | ICD-10-CM | POA: Diagnosis not present

## 2018-06-12 DIAGNOSIS — K5730 Diverticulosis of large intestine without perforation or abscess without bleeding: Secondary | ICD-10-CM | POA: Diagnosis not present

## 2018-06-16 ENCOUNTER — Other Ambulatory Visit: Payer: Self-pay

## 2018-06-16 MED ORDER — NITROGLYCERIN 0.4 MG SL SUBL: 0.4000 mg | SUBLINGUAL_TABLET | SUBLINGUAL | 4 refills | Status: DC | PRN

## 2018-07-03 DIAGNOSIS — Z794 Long term (current) use of insulin: Secondary | ICD-10-CM | POA: Diagnosis not present

## 2018-07-03 DIAGNOSIS — H43393 Other vitreous opacities, bilateral: Secondary | ICD-10-CM | POA: Diagnosis not present

## 2018-07-03 DIAGNOSIS — E113293 Type 2 diabetes mellitus with mild nonproliferative diabetic retinopathy without macular edema, bilateral: Secondary | ICD-10-CM | POA: Diagnosis not present

## 2018-07-03 DIAGNOSIS — H2513 Age-related nuclear cataract, bilateral: Secondary | ICD-10-CM | POA: Diagnosis not present

## 2018-07-19 ENCOUNTER — Encounter (INDEPENDENT_AMBULATORY_CARE_PROVIDER_SITE_OTHER): Payer: Self-pay | Admitting: Orthopaedic Surgery

## 2018-07-19 ENCOUNTER — Ambulatory Visit (INDEPENDENT_AMBULATORY_CARE_PROVIDER_SITE_OTHER): Payer: Medicare Other | Admitting: Orthopaedic Surgery

## 2018-07-19 DIAGNOSIS — M7671 Peroneal tendinitis, right leg: Secondary | ICD-10-CM

## 2018-07-19 NOTE — Progress Notes (Signed)
HPI: Mr. Walters returns today follow-up of his right ankle peroneal tendinosis.  He states that the cam walker boot definitely has helped.  He is now trying to wean himself out of it.  He is gone to be on uneven ground he actually wears the boot.  Occasionally taken some acetaminophen and naproxen.  Overall feels he is much better off than he was.  Again he is diabetic has been diabetic for some 15 years has some neuropathy of his feet.  He has had no new injury to the ankle.  Review of systems: See HPI otherwise negative  Physical exam: General well-developed well-nourished male no acute distress mood and affect appropriate. Vascular: Right foot dorsal pedal pulses intact. Right foot: Pes planus deformity.  No abnormal warmth erythema no rashes skin lesions ulcerations.  Maximal tenderness over the sinus Tarsi region.  He is 5 out of 5 strength with external and internal rotation against resistance.  He is able do a single heel raise but has some pain and difficulty with this.  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  Also advised him to get an off-the-shelf orthotic for his shoe that should be a pliable orthotic and had a medial hindfoot wedge.  He will follow-up with [place] on as-needed basis pain persist or becomes worse.  Questions encouraged and answered.

## 2018-07-28 DIAGNOSIS — G4733 Obstructive sleep apnea (adult) (pediatric): Secondary | ICD-10-CM | POA: Diagnosis not present

## 2018-07-28 DIAGNOSIS — I10 Essential (primary) hypertension: Secondary | ICD-10-CM | POA: Diagnosis not present

## 2018-07-28 DIAGNOSIS — E782 Mixed hyperlipidemia: Secondary | ICD-10-CM | POA: Diagnosis not present

## 2018-07-28 DIAGNOSIS — R32 Unspecified urinary incontinence: Secondary | ICD-10-CM | POA: Diagnosis not present

## 2018-07-28 DIAGNOSIS — I480 Paroxysmal atrial fibrillation: Secondary | ICD-10-CM | POA: Diagnosis not present

## 2018-07-28 DIAGNOSIS — E1142 Type 2 diabetes mellitus with diabetic polyneuropathy: Secondary | ICD-10-CM | POA: Diagnosis not present

## 2018-07-28 DIAGNOSIS — I2510 Atherosclerotic heart disease of native coronary artery without angina pectoris: Secondary | ICD-10-CM | POA: Diagnosis not present

## 2018-07-28 DIAGNOSIS — E11319 Type 2 diabetes mellitus with unspecified diabetic retinopathy without macular edema: Secondary | ICD-10-CM | POA: Diagnosis not present

## 2018-07-28 DIAGNOSIS — E669 Obesity, unspecified: Secondary | ICD-10-CM | POA: Diagnosis not present

## 2018-10-11 ENCOUNTER — Telehealth: Payer: Self-pay | Admitting: *Deleted

## 2018-10-11 NOTE — Telephone Encounter (Signed)
Per Dennis King, ask me to move his recall with [name] from June to December 2020. He is schedule to see [name] in August 2020.

## 2018-10-24 DIAGNOSIS — G4733 Obstructive sleep apnea (adult) (pediatric): Secondary | ICD-10-CM | POA: Diagnosis not present

## 2018-12-28 ENCOUNTER — Telehealth: Payer: Self-pay

## 2018-12-28 NOTE — Telephone Encounter (Signed)
Spoke with pt regarding appt on 01/01/19. Pt stated he will check his vitals prior to appt and did not have any questions.

## 2019-01-01 ENCOUNTER — Encounter: Payer: Self-pay | Admitting: Internal Medicine

## 2019-01-01 ENCOUNTER — Telehealth (INDEPENDENT_AMBULATORY_CARE_PROVIDER_SITE_OTHER): Payer: Medicare Other | Admitting: Internal Medicine

## 2019-01-01 VITALS — BP 144/73 | HR 62 | Ht 70.5 in | Wt 264.0 lb

## 2019-01-01 DIAGNOSIS — I10 Essential (primary) hypertension: Secondary | ICD-10-CM | POA: Diagnosis not present

## 2019-01-01 DIAGNOSIS — Z6837 Body mass index (BMI) 37.0-37.9, adult: Secondary | ICD-10-CM | POA: Diagnosis not present

## 2019-01-01 DIAGNOSIS — I480 Paroxysmal atrial fibrillation: Secondary | ICD-10-CM

## 2019-01-01 DIAGNOSIS — Z7901 Long term (current) use of anticoagulants: Secondary | ICD-10-CM | POA: Diagnosis not present

## 2019-01-01 DIAGNOSIS — I2510 Atherosclerotic heart disease of native coronary artery without angina pectoris: Secondary | ICD-10-CM | POA: Diagnosis not present

## 2019-01-01 DIAGNOSIS — E7800 Pure hypercholesterolemia, unspecified: Secondary | ICD-10-CM

## 2019-01-01 DIAGNOSIS — E663 Overweight: Secondary | ICD-10-CM

## 2019-01-01 DIAGNOSIS — G4733 Obstructive sleep apnea (adult) (pediatric): Secondary | ICD-10-CM | POA: Diagnosis not present

## 2019-01-01 NOTE — Progress Notes (Signed)
Electrophysiology TeleHealth Note   Due to national recommendations of social distancing due to COVID 19, an audio/video telehealth visit is felt to be most appropriate for this patient at this time.  See MyChart message from today for the patient's consent to telehealth for [facility].   Date:  01/01/2019   ID:  Alexandria LodgeRobert N Ladley, DOB December 07, 1950, MRN [id number]  Location: patient's home  [facility] [place]  Evaluation Performed: Follow-up visit  PCP:  Henrine Screwshacker, Jahbari, MD   Electrophysiologist:  Dr Johney FrameAllred  Chief Complaint:  palpitations  History of Present Illness:    Alexandria LodgeRobert N Leabo is a 68 y.o. male who presents via telehealth conferencing today.  Since last being seen in our clinic, the patient reports doing very well.  "I feel fine".  "I cant remember the last time I had afib".  He attributes this to reduced alcohol.  Today, he denies symptoms of palpitations, chest pain, shortness of breath,  lower extremity edema, dizziness, presyncope, or syncope.  The patient is otherwise without complaint today.  The patient denies symptoms of fevers, chills, cough, or new SOB worrisome for COVID 19.  Past Medical History:  Diagnosis Date   Arthritis    Coronary artery disease    past post circumflex obtuse marginal branch cutting balloon atherectomy by Dr. Eldridge DaceVaranasi   Family history of heart disease    Hyperlipidemia    Hypertension    Obesity    Obstructive sleep apnea    on C Pap   Paroxysmal A-fib (HCC)    Type 2 diabetes mellitus (HCC)     Past Surgical History:  Procedure Laterality Date   APPENDECTOMY     CARDIAC CATHETERIZATION N/A 10/22/2014   Procedure: Left Heart Cath and Coronary Angiography;  Surgeon: Corky CraftsJayadeep S Varanasi, MD;  [facility];  Service: Cardiovascular;  Laterality: N/A;   CARDIAC CATHETERIZATION Right 10/22/2014   Procedure: Coronary Stent Intervention;  Surgeon: Corky CraftsJayadeep S Varanasi, MD;  [facility];  Service: Cardiovascular;  Laterality: Right;   ELECTROPHYSIOLOGIC STUDY N/A 01/31/2015   Procedure: Atrial Fibrillation Ablation;  Surgeon: Hillis RangeJames Teriah Muela, MD;  [facility];  Service: Cardiovascular;  Laterality: N/A;   TEE WITHOUT CARDIOVERSION N/A 01/30/2015   Procedure: TRANSESOPHAGEAL ECHOCARDIOGRAM (TEE);  Surgeon: Wendall StadePeter C Nishan, MD;  [facility];  Service: Cardiovascular;  Laterality: N/A;    Current Outpatient Medications  Medication Sig Dispense Refill   carvedilol (COREG) 25 MG tablet Take 25 mg by mouth 2 (two) times daily with a meal.      diclofenac sodium (VOLTAREN) 1 % GEL Apply 2 g topically 4 (four) times daily.     ELIQUIS 5 MG TABS tablet TAKE ONE TABLET BY MOUTH TWICE DAILY 60 tablet 5   Evolocumab (REPATHA) 140 MG/ML SOSY Inject 1 pen into the skin every 14 (fourteen) days.     INVOKANA 300 MG TABS tablet Take 1 tablet by mouth daily.  4   JARDIANCE 25 MG TABS tablet Take 25 mg by mouth daily.      lisinopril-hydrochlorothiazide (PRINZIDE,ZESTORETIC) 20-25 MG per tablet Take 1 tablet by mouth daily.      metFORMIN (GLUCOPHAGE) 1000 MG tablet Take 1,000 mg by mouth 2 (two) times daily with a meal.      Multiple Vitamins-Minerals (MULTIVITAMIN ADULT PO) Take 1 tablet by mouth daily.     nitroGLYCERIN (NITROSTAT) 0.4 MG SL tablet Place 1 tablet (0.4 mg total) under the  tongue every 5 (five) minutes as needed for chest pain. 25 tablet 4   NON FORMULARY Cpap 8cm H2O     NOVOLOG MIX 70/30 (70-30) 100 UNIT/ML injection as directed. Sliding scale  4   No current facility-administered medications for this visit.     Allergies:   Statins, Livalo [pitavastatin], and Tape   Social History:  The patient  reports that he has never smoked. He has never used smokeless tobacco. He reports current alcohol use of about 14.0 - 30.0 standard drinks of alcohol per week. He reports that he does not use drugs.   Family History:  The  patient's  family history includes Atrial fibrillation in his mother; Heart attack (age of onset: 76) in his father.   ROS:  Please see the history of present illness.   All other systems are personally reviewed and negative.    Exam:    Vital Signs:  BP (!) 144/73    Pulse 62    Ht 5' 10.5" (1.791 m)    Wt 264 lb (119.7 kg)    BMI 37.34 kg/m   Well sounding and appearing, alert and conversant, regular work of breathing,  good skin color Eyes- anicteric, neuro- grossly intact, skin- no apparent rash or lesions or cyanosis, mouth- oral mucosa is pink  Labs/Other Tests and Data Reviewed:    Recent Labs: No results found for requested labs within last 8760 hours.   Wt Readings from Last 3 Encounters:  01/01/19 264 lb (119.7 kg)  05/22/18 265 lb 6.4 oz (120.4 kg)  06/15/17 269 lb (122 kg)      ASSESSMENT & PLAN:    1.  paroxysmal atrial fibrillation Doing well  chads2vasc score is 3.  On eliquis.  He takes NSAIDs and worries about this.  We discussed consideration of ILR to monitor AF burden and better characterize his AF.  He is not interested in this currently.  2. HTN Stable No change required today  3. OSA Compliance with CPAP encouraged Followed by Dr Maxwell Caul  4. Overweight Lifestyle modification encouraged He is doing his best but this is a struggle.  5. CAD No ischemic symptoms  Follow-up:  6 months with Dr Gwenlyn Found EP APP in a year   Patient Risk:  after full review of this patients clinical status, I feel that they are at moderate risk at this time.  Today, I have spent 15 minutes with the patient with telehealth technology discussing arrhythmia management .    Army Fossa, MD  01/01/2019 10:37 AM     [facility] [address] [place] [address] [phone] (office) [phone] (fax)

## 2019-04-26 DIAGNOSIS — Z0000 Encounter for general adult medical examination without abnormal findings: Secondary | ICD-10-CM | POA: Diagnosis not present

## 2019-05-01 DIAGNOSIS — Z136 Encounter for screening for cardiovascular disorders: Secondary | ICD-10-CM | POA: Diagnosis not present

## 2019-05-01 DIAGNOSIS — Z125 Encounter for screening for malignant neoplasm of prostate: Secondary | ICD-10-CM | POA: Diagnosis not present

## 2019-05-01 DIAGNOSIS — Z0000 Encounter for general adult medical examination without abnormal findings: Secondary | ICD-10-CM | POA: Diagnosis not present

## 2019-05-01 DIAGNOSIS — E1142 Type 2 diabetes mellitus with diabetic polyneuropathy: Secondary | ICD-10-CM | POA: Diagnosis not present

## 2019-05-01 DIAGNOSIS — Z23 Encounter for immunization: Secondary | ICD-10-CM | POA: Diagnosis not present

## 2019-05-01 DIAGNOSIS — Z794 Long term (current) use of insulin: Secondary | ICD-10-CM | POA: Diagnosis not present

## 2019-05-22 ENCOUNTER — Telehealth: Payer: Self-pay | Admitting: Pharmacist

## 2019-05-22 NOTE — Telephone Encounter (Signed)
Patient called and left voicemail about his PA for [employer] next year. Tried to submit, but states no authorization needed.  Left voicemail for patient to call back

## 2019-06-19 ENCOUNTER — Telehealth: Payer: Self-pay | Admitting: Pharmacist

## 2019-06-19 MED ORDER — REPATHA SURECLICK 140 MG/ML ~~LOC~~ SOAJ: 1.0000 "pen " | SUBCUTANEOUS | 11 refills | Status: DC

## 2019-06-19 NOTE — Telephone Encounter (Signed)
Patient called to renew his SNF application. Advised that he would not be approved this year, but could apply for [employer]. Explained how grant worked. Patient in agreement for [place] to apply for him. Patient approved Pharmacy Card Id [id number] Group [id number] PCN [id number] BIN [id number]  [facility] [place] per patient request. Patient made aware and questions answered

## 2019-07-03 ENCOUNTER — Telehealth: Payer: Self-pay | Admitting: Pharmacist

## 2019-07-03 NOTE — Telephone Encounter (Signed)
Patient called and left message about [employer] PA. I have resubmitted PA for patient and am awaiting response. Will need to confirm patient did not get new plan/card info change. Called patient and left VM

## 2019-07-03 NOTE — Telephone Encounter (Signed)
PA approved through 07/02/2020. Patient made aware

## 2019-07-08 DIAGNOSIS — Z23 Encounter for immunization: Secondary | ICD-10-CM | POA: Diagnosis not present

## 2019-07-24 ENCOUNTER — Other Ambulatory Visit: Payer: Self-pay | Admitting: Cardiovascular Disease

## 2019-08-04 DIAGNOSIS — Z23 Encounter for immunization: Secondary | ICD-10-CM | POA: Diagnosis not present

## 2019-08-10 DIAGNOSIS — H43393 Other vitreous opacities, bilateral: Secondary | ICD-10-CM | POA: Diagnosis not present

## 2019-08-10 DIAGNOSIS — E113293 Type 2 diabetes mellitus with mild nonproliferative diabetic retinopathy without macular edema, bilateral: Secondary | ICD-10-CM | POA: Diagnosis not present

## 2019-08-10 DIAGNOSIS — Z794 Long term (current) use of insulin: Secondary | ICD-10-CM | POA: Diagnosis not present

## 2019-08-10 DIAGNOSIS — H2513 Age-related nuclear cataract, bilateral: Secondary | ICD-10-CM | POA: Diagnosis not present

## 2019-09-24 ENCOUNTER — Telehealth (INDEPENDENT_AMBULATORY_CARE_PROVIDER_SITE_OTHER): Payer: Medicare Other | Admitting: Internal Medicine

## 2019-09-24 ENCOUNTER — Encounter: Payer: Self-pay | Admitting: Internal Medicine

## 2019-09-24 ENCOUNTER — Other Ambulatory Visit: Payer: Self-pay

## 2019-09-24 VITALS — BP 143/78 | HR 74 | Ht 70.5 in | Wt 264.0 lb

## 2019-09-24 DIAGNOSIS — I480 Paroxysmal atrial fibrillation: Secondary | ICD-10-CM | POA: Diagnosis not present

## 2019-09-24 DIAGNOSIS — G4733 Obstructive sleep apnea (adult) (pediatric): Secondary | ICD-10-CM | POA: Diagnosis not present

## 2019-09-24 DIAGNOSIS — I10 Essential (primary) hypertension: Secondary | ICD-10-CM

## 2019-09-24 NOTE — Progress Notes (Signed)
Electrophysiology TeleHealth Note   Due to national recommendations of social distancing due to COVID 19, an audio/video telehealth visit is felt to be most appropriate for this patient at this time.  See MyChart message from today for the patient's consent to telehealth for [facility].  Date:  09/24/2019   ID:  Dennis King, DOB January 22, 1951, MRN [id number]  Location: patient's home  Provider location:  [place]  Evaluation Performed: Follow-up visit  PCP:  Henrine Screws, MD   Electrophysiologist:  Dr [name]  Chief Complaint:  palpitations  History of Present Illness:    Dennis King is a 69 y.o. male who presents via telehealth conferencing today.  Since last being seen in our clinic, the patient reports doing very well.  He has palpitations which have increased in frequency and duration over the past year.  He finds that these are more noticeable during social gatherings. He reports that his ETOH consumption is "low to moderate".  Today, he denies symptoms of chest pain, shortness of breath,  lower extremity edema, dizziness, presyncope, or syncope.  The patient is otherwise without complaint today.   Past Medical History:  Diagnosis Date  . Arthritis   . Coronary artery disease    past post circumflex obtuse marginal branch cutting balloon atherectomy by Dr. [name]  . Family history of heart disease   . Hyperlipidemia   . Hypertension   . Obesity   . Obstructive sleep apnea    on C Pap  . Paroxysmal A-fib (HCC)   . Type 2 diabetes mellitus (HCC)     Past Surgical History:  Procedure Laterality Date  . APPENDECTOMY    . CARDIAC CATHETERIZATION N/A 10/22/2014   Procedure: Left Heart Cath and Coronary Angiography;  Surgeon: Corky Crafts, MD;  Location: [facility] INVASIVE CV LAB;  Service: Cardiovascular;  Laterality: N/A;  . CARDIAC CATHETERIZATION Right 10/22/2014   Procedure: Coronary Stent Intervention;  Surgeon: Corky Crafts, MD;   Location: [facility] INVASIVE CV LAB;  Service: Cardiovascular;  Laterality: Right;  . ELECTROPHYSIOLOGIC STUDY N/A 01/31/2015   Procedure: Atrial Fibrillation Ablation;  Surgeon: [name], MD;  Location: [facility] INVASIVE CV LAB;  Service: Cardiovascular;  Laterality: N/A;  . TEE WITHOUT CARDIOVERSION N/A 01/30/2015   Procedure: TRANSESOPHAGEAL ECHOCARDIOGRAM (TEE);  Surgeon: Wendall Stade, MD;  Location: [facility] ENDOSCOPY;  Service: Cardiovascular;  Laterality: N/A;    Current Outpatient Medications  Medication Sig Dispense Refill  . carvedilol (COREG) 25 MG tablet Take 25 mg by mouth 2 (two) times daily with a meal.     . diclofenac sodium (VOLTAREN) 1 % GEL Apply 2 g topically 4 (four) times daily.    Marland Kitchen ELIQUIS 5 MG TABS tablet TAKE 1 TABLET BY MOUTH TWICE A DAY 180 tablet 3  . Evolocumab (REPATHA SURECLICK) 140 MG/ML SOAJ Inject 1 pen into the skin every 14 (fourteen) days. 2 pen 11  . INVOKANA 300 MG TABS tablet Take 1 tablet by mouth daily.  4  . lisinopril-hydrochlorothiazide (PRINZIDE,ZESTORETIC) 20-25 MG per tablet Take 1 tablet by mouth daily.     . metFORMIN (GLUCOPHAGE) 1000 MG tablet Take 1,000 mg by mouth 2 (two) times daily with a meal.     . Multiple Vitamins-Minerals (MULTIVITAMIN ADULT PO) Take 1 tablet by mouth daily.    . Naproxen (NAPROSYN PO) Take by mouth 2 (two) times daily with a meal.     . nitroGLYCERIN (NITROSTAT) 0.4 MG SL tablet Place 1 tablet (0.4 mg  total) under the tongue every 5 (five) minutes as needed for chest pain. 25 tablet 4  . NON FORMULARY Cpap 8cm H2O    . NOVOLOG MIX 70/30 (70-30) 100 UNIT/ML injection as directed. Sliding scale  4   No current facility-administered medications for this visit.    Allergies:   Statins, Livalo [pitavastatin], and Tape   Social History:  The patient  reports that he has never smoked. He has never used smokeless tobacco. He reports current alcohol use of about 14.0 - 30.0 standard drinks of alcohol per week. He reports that he does  not use drugs.   ROS:  Please see the history of present illness.   All other systems are personally reviewed and negative.    Exam:    Vital Signs:  BP (!) 143/78   Pulse 74   Ht 5' 10.5" (1.791 m)   Wt 264 lb (119.7 kg)   BMI 37.34 kg/m   Well sounding and appearing, alert and conversant, regular work of breathing,  good skin color Eyes- anicteric, neuro- grossly intact, skin- no apparent rash or lesions or cyanosis, mouth- oral mucosa is pink  Labs/Other Tests and Data Reviewed:    Recent Labs: No results found for requested labs within last 8760 hours.   Wt Readings from Last 3 Encounters:  09/24/19 264 lb (119.7 kg)  01/01/19 264 lb (119.7 kg)  05/22/18 265 lb 6.4 oz (120.4 kg)     ASSESSMENT & PLAN:    1.  paroxysmal atrial fibrillation He feels that his AF burden has increased. I feel that additional monitoring is advised to better characterize the source of his palpitations before we consider ablation.  Today we discussed options of ILR, KardiaMobile, and 14 day zio.  Pros and cons of each were discussed today. He will think about these options and let us know.  If he decides to proceed with ILR, we will schedule at the next available tiime.  Risks and benefits including bleeding and infection of ILR implant were considered.  2. HTN Stable We discussed reducing coreg to treat fatigue today.  He is not ready to make this change  3. OSA Compliant with CPAP  4. Overweight I believe that he would do better with weight loss  5. CAD Stable No change required today   Follow-up:  4 weeks with me unless he decides to proceed with ILR in the interim.   Patient Risk:  after full review of this patients clinical status, I feel that they are at moderate risk at this time.  Today, I have spent 25 minutes with the patient with telehealth technology discussing arrhythmia management .    Army Fossa, MD  09/24/2019 11:28 AM     [facility] [address] [place] [address] [phone] (office) [phone] (fax)

## 2019-10-25 DIAGNOSIS — G4733 Obstructive sleep apnea (adult) (pediatric): Secondary | ICD-10-CM | POA: Diagnosis not present

## 2019-11-06 ENCOUNTER — Telehealth: Payer: Self-pay

## 2019-11-06 NOTE — Telephone Encounter (Signed)
Left a message regarding virtual appt on 11/07/19. 

## 2019-11-07 ENCOUNTER — Telehealth (INDEPENDENT_AMBULATORY_CARE_PROVIDER_SITE_OTHER): Payer: Medicare Other | Admitting: Internal Medicine

## 2019-11-07 ENCOUNTER — Other Ambulatory Visit: Payer: Self-pay

## 2019-11-07 DIAGNOSIS — G4733 Obstructive sleep apnea (adult) (pediatric): Secondary | ICD-10-CM

## 2019-11-07 DIAGNOSIS — I480 Paroxysmal atrial fibrillation: Secondary | ICD-10-CM | POA: Diagnosis not present

## 2019-11-07 DIAGNOSIS — I10 Essential (primary) hypertension: Secondary | ICD-10-CM | POA: Diagnosis not present

## 2019-11-07 DIAGNOSIS — D6869 Other thrombophilia: Secondary | ICD-10-CM | POA: Diagnosis not present

## 2019-11-07 NOTE — Progress Notes (Signed)
Electrophysiology TeleHealth Note   Due to national recommendations of social distancing due to COVID 19, an audio/video telehealth visit is felt to be most appropriate for this patient at this time.  See MyChart message from today for the patient's consent to telehealth for [facility].  Date:  11/07/2019   ID:  Alexandria Lodge, DOB August 03, 1950, MRN [id number]  Location: patient's home  Provider location:  [place]  Evaluation Performed: Follow-up visit  PCP:  Henrine Screws, MD   Electrophysiologist:  Dr [name]  Chief Complaint:  palpitations  History of Present Illness:    ABDULKAREEM BADOLATO is a 69 y.o. male who presents via telehealth conferencing today.  Since last being seen in our clinic, the patient reports doing very well.  He has had only one episode of symptomatic afib since his last visit.  He has seen Dr Earl Gala and had his CPAP adjusted.  He is sleeping better.  Today, he denies symptoms of palpitations, chest pain, shortness of breath,  lower extremity edema, dizziness, presyncope, or syncope.  The patient is otherwise without complaint today.   Past Medical History:  Diagnosis Date   Arthritis    Coronary artery disease    past post circumflex obtuse marginal branch cutting balloon atherectomy by Dr. [name]   Family history of heart disease    Hyperlipidemia    Hypertension    Obesity    Obstructive sleep apnea    on C Pap   Paroxysmal A-fib (HCC)    Type 2 diabetes mellitus (HCC)     Past Surgical History:  Procedure Laterality Date   APPENDECTOMY     CARDIAC CATHETERIZATION N/A 10/22/2014   Procedure: Left Heart Cath and Coronary Angiography;  Surgeon: Corky Crafts, MD;  Location[facility] INVASIVE CV LAB;  Service: Cardiovascular;  Laterality: N/A;   CARDIAC CATHETERIZATION Right 10/22/2014   Procedure: Coronary Stent Intervention;  Surgeon: Corky Crafts, MD;  Location: [facility] INVASIVE CV LAB;  Service: Cardiovascular;   Laterality: Right;   ELECTROPHYSIOLOGIC STUDY N/A 01/31/2015   Procedure: Atrial Fibrillation Ablation;  Surgeon: [name], MD;  Location: [facility] INVASIVE CV LAB;  Service: Cardiovascular;  Laterality: N/A;   TEE WITHOUT CARDIOVERSION N/A 01/30/2015   Procedure: TRANSESOPHAGEAL ECHOCARDIOGRAM (TEE);  Surgeon: Wendall Stade, MD;  Location: [facility] ENDOSCOPY;  Service: Cardiovascular;  Laterality: N/A;    Current Outpatient Medications  Medication Sig Dispense Refill   carvedilol (COREG) 25 MG tablet Take 25 mg by mouth 2 (two) times daily with a meal.      diclofenac sodium (VOLTAREN) 1 % GEL Apply 2 g topically 4 (four) times daily.     ELIQUIS 5 MG TABS tablet TAKE 1 TABLET BY MOUTH TWICE A DAY 180 tablet 3   Evolocumab (REPATHA SURECLICK) 140 MG/ML SOAJ Inject 1 pen into the skin every 14 (fourteen) days. 2 pen 11   INVOKANA 300 MG TABS tablet Take 1 tablet by mouth daily.  4   lisinopril-hydrochlorothiazide (PRINZIDE,ZESTORETIC) 20-25 MG per tablet Take 1 tablet by mouth daily.      metFORMIN (GLUCOPHAGE) 1000 MG tablet Take 1,000 mg by mouth 2 (two) times daily with a meal.      Multiple Vitamins-Minerals (MULTIVITAMIN ADULT PO) Take 1 tablet by mouth daily.     Naproxen (NAPROSYN PO) Take by mouth 2 (two) times daily with a meal.      nitroGLYCERIN (NITROSTAT) 0.4 MG SL tablet Place 1 tablet (0.4 mg total) under the tongue every  5 (five) minutes as needed for chest pain. 25 tablet 4   NON FORMULARY Cpap 8cm H2O     NOVOLOG MIX 70/30 (70-30) 100 UNIT/ML injection as directed. Sliding scale  4   No current facility-administered medications for this visit.    Allergies:   Statins, Livalo [pitavastatin], and Tape   Social History:  The patient  reports that he has never smoked. He has never used smokeless tobacco. He reports current alcohol use of about 14.0 - 30.0 standard drinks of alcohol per week. He reports that he does not use drugs.   ROS:  Please see the history of present  illness.   All other systems are personally reviewed and negative.    Exam:    Vital Signs:  There were no vitals taken for this visit.  Well sounding and appearing, alert and conversant, regular work of breathing,  good skin color Eyes- anicteric, neuro- grossly intact, skin- no apparent rash or lesions or cyanosis, mouth- oral mucosa is pink  Labs/Other Tests and Data Reviewed:    Recent Labs: No results found for requested labs within last 8760 hours.   Wt Readings from Last 3 Encounters:  09/24/19 264 lb (119.7 kg)  01/01/19 264 lb (119.7 kg)  05/22/18 265 lb 6.4 oz (120.4 kg)     His kardia strips from the past month are reviewed.  This confirms afib on 10/19/19,  No other afib events.  ASSESSMENT & PLAN:    1.  Paroxysmal atrial fibrillation He previously drank about 20 cups of coffee a day!  He has reduced this. Also has worked with Dr Maxwell Caul on OSA treatment  We could revisit ablation vs AAD therapy if his afib progresses Hopefully he will continue to improve chads2vasc score is 4.  He is on eliquis  2. Hypertensive cardiovascular disease We discussed lifestyle modification as the best treatment for him currently  3. Obesity Lifestyle modification discussed at length today I would love for him to lose 25 lbs  4. OSA Follows with Dr Maxwell Caul  5. CAD No ischemic symptoms  Risks, benefits and potential toxicities for medications prescribed and/or refilled reviewed with patient today.   Follow-up:  4 months   Patient Risk:  after full review of this patients clinical status, I feel that they are at moderate risk at this time.  Today, I have spent 15 minutes with the patient with telehealth technology discussing arrhythmia management .    Army Fossa, MD  11/07/2019 10:57 AM     [facility] [address] [place] [address] [phone] (office) [phone] (fax)

## 2020-03-10 ENCOUNTER — Encounter: Payer: Self-pay | Admitting: *Deleted

## 2020-03-10 ENCOUNTER — Encounter: Payer: Self-pay | Admitting: Internal Medicine

## 2020-03-10 ENCOUNTER — Other Ambulatory Visit: Payer: Self-pay

## 2020-03-10 ENCOUNTER — Ambulatory Visit (INDEPENDENT_AMBULATORY_CARE_PROVIDER_SITE_OTHER): Payer: Medicare Other | Admitting: Internal Medicine

## 2020-03-10 VITALS — BP 144/76 | HR 63 | Ht 70.5 in | Wt 275.0 lb

## 2020-03-10 DIAGNOSIS — D6869 Other thrombophilia: Secondary | ICD-10-CM

## 2020-03-10 DIAGNOSIS — G4733 Obstructive sleep apnea (adult) (pediatric): Secondary | ICD-10-CM

## 2020-03-10 DIAGNOSIS — I480 Paroxysmal atrial fibrillation: Secondary | ICD-10-CM | POA: Diagnosis not present

## 2020-03-10 DIAGNOSIS — I10 Essential (primary) hypertension: Secondary | ICD-10-CM | POA: Diagnosis not present

## 2020-03-10 DIAGNOSIS — I4891 Unspecified atrial fibrillation: Secondary | ICD-10-CM | POA: Diagnosis not present

## 2020-03-10 NOTE — Patient Instructions (Addendum)
Medication Instructions:  Your physician recommends that you continue on your current medications as directed. Please refer to the Current Medication list given to you today.  *If you need a refill on your cardiac medications before your next appointment, please call your pharmacy*  Lab Work: None ordered.  If you have labs (blood work) drawn today and your tests are completely normal, you will receive your results only by: Marland Kitchen MyChart Message (if you have MyChart) OR . A paper copy in the mail If you have any lab test that is abnormal or we need to change your treatment, we will call you to review the results.  Testing/Procedures: Please schedule ECHO  Your physician has requested that you have an echocardiogram. Echocardiography is a painless test that uses sound waves to create images of your heart. It provides your doctor with information about the size and shape of your heart and how well your heart's chambers and valves are working. This procedure takes approximately one hour. There are no restrictions for this procedure.    Follow-Up: At [facility], you and your health needs are our priority.  As part of our continuing mission to provide you with exceptional heart care, we have created designated Provider Care Teams.  These Care Teams include your primary Cardiologist (physician) and Advanced Practice Providers (APPs -  Physician Assistants and Nurse Practitioners) who all work together to provide you with the care you need, when you need it.  We recommend signing up for the patient portal called "MyChart".  Sign up information is provided on this After Visit Summary.  MyChart is used to connect with patients for Virtual Visits (Telemedicine).  Patients are able to view lab/test results, encounter notes, upcoming appointments, etc.  Non-urgent messages can be sent to your provider as well.   To learn more about what you can do with MyChart, go to [email or web address].     Other  Instructions:  Cardiac Ablation Cardiac ablation is a procedure to disable (ablate) a small amount of heart tissue in very specific places. The heart has many electrical connections. Sometimes these connections are abnormal and can cause the heart to beat very fast or irregularly. Ablating some of the problem areas can improve the heart rhythm or return it to normal. Ablation may be done for people who:  Have Wolff-Parkinson-White syndrome.  Have fast heart rhythms (tachycardia).  Have taken medicines for an abnormal heart rhythm (arrhythmia) that were not effective or caused side effects.  Have a high-risk heartbeat that may be life-threatening. During the procedure, a small incision is made in the neck or the groin, and a long, thin, flexible tube (catheter) is inserted into the incision and moved to the heart. Small devices (electrodes) on the tip of the catheter will send out electrical currents. A type of X-ray (fluoroscopy) will be used to help guide the catheter and to provide images of the heart. Tell a health care provider about:  Any allergies you have.  All medicines you are taking, including vitamins, herbs, eye drops, creams, and over-the-counter medicines.  Any problems you or family members have had with anesthetic medicines.  Any blood disorders you have.  Any surgeries you have had.  Any medical conditions you have, such as kidney failure.  Whether you are pregnant or may be pregnant. What are the risks? Generally, this is a safe procedure. However, problems may occur, including:  Infection.  Bruising and bleeding at the catheter insertion site.  Bleeding into the chest, especially into  the sac that surrounds the heart. This is a serious complication.  Stroke or blood clots.  Damage to other structures or organs.  Allergic reaction to medicines or dyes.  Need for a permanent pacemaker if the normal electrical system is damaged. A pacemaker is a small  computer that sends electrical signals to the heart and helps your heart beat normally.  The procedure not being fully effective. This may not be recognized until months later. Repeat ablation procedures are sometimes required. What happens before the procedure?  Follow instructions from your health care provider about eating or drinking restrictions.  Ask your health care provider about: ? Changing or stopping your regular medicines. This is especially important if you are taking diabetes medicines or blood thinners. ? Taking medicines such as aspirin and ibuprofen. These medicines can thin your blood. Do not take these medicines before your procedure if your health care provider instructs you not to.  Plan to have someone take you home from the hospital or clinic.  If you will be going home right after the procedure, plan to have someone with you for 24 hours. What happens during the procedure?  To lower your risk of infection: ? Your health care team will wash or sanitize their hands. ? Your skin will be washed with soap. ? Hair may be removed from the incision area.  An IV tube will be inserted into one of your veins.  You will be given a medicine to help you relax (sedative).  The skin on your neck or groin will be numbed.  An incision will be made in your neck or your groin.  A needle will be inserted through the incision and into a large vein in your neck or groin.  A catheter will be inserted into the needle and moved to your heart.  Dye may be injected through the catheter to help your surgeon see the area of the heart that needs treatment.  Electrical currents will be sent from the catheter to ablate heart tissue in desired areas. There are three types of energy that may be used to ablate heart tissue: ? Heat (radiofrequency energy). ? Laser energy. ? Extreme cold (cryoablation).  When the necessary tissue has been ablated, the catheter will be removed.  Pressure  will be held on the catheter insertion area to prevent excessive bleeding.  A bandage (dressing) will be placed over the catheter insertion area. The procedure may vary among health care providers and hospitals. What happens after the procedure?  Your blood pressure, heart rate, breathing rate, and blood oxygen level will be monitored until the medicines you were given have worn off.  Your catheter insertion area will be monitored for bleeding. You will need to lie still for a few hours to ensure that you do not bleed from the catheter insertion area.  Do not drive for 24 hours or as long as directed by your health care provider. Summary  Cardiac ablation is a procedure to disable (ablate) a small amount of heart tissue in very specific places. Ablating some of the problem areas can improve the heart rhythm or return it to normal.  During the procedure, electrical currents will be sent from the catheter to ablate heart tissue in desired areas. This information is not intended to replace advice given to you by your health care provider. Make sure you discuss any questions you have with your health care provider. Document Revised: 10/31/2017 Document Reviewed: 03/29/2016 Elsevier Patient Education  2020 [employer].

## 2020-03-10 NOTE — Progress Notes (Signed)
PCP: Henrine Screws, MD Primary Cardiologist: Dr Allyson Sabal Primary EP: Dr [name] is a 69 y.o. male who presents today for routine electrophysiology followup.  Since last being seen in our clinic, the patient reports doing very well. He reports increasing frequency and duration of his afib.  + decreased exercise tolerance with his afib.  He has reduced coffee to 2 cups of caffeine per day.  Today, he denies symptoms of palpitations, chest pain, shortness of breath,  lower extremity edema, dizziness, presyncope, or syncope.  The patient is otherwise without complaint today.   Past Medical History:  Diagnosis Date  . Arthritis   . Coronary artery disease    past post circumflex obtuse marginal branch cutting balloon atherectomy by Dr. [name]  . Family history of heart disease   . Hyperlipidemia   . Hypertension   . Obesity   . Obstructive sleep apnea    on C Pap  . Paroxysmal A-fib (HCC)   . Type 2 diabetes mellitus (HCC)    Past Surgical History:  Procedure Laterality Date  . APPENDECTOMY    . CARDIAC CATHETERIZATION N/A 10/22/2014   Procedure: Left Heart Cath and Coronary Angiography;  Surgeon: Corky Crafts, MD;  Location: [facility] INVASIVE CV LAB;  Service: Cardiovascular;  Laterality: N/A;  . CARDIAC CATHETERIZATION Right 10/22/2014   Procedure: Coronary Stent Intervention;  Surgeon: Corky Crafts, MD;  Location: [facility] INVASIVE CV LAB;  Service: Cardiovascular;  Laterality: Right;  . ELECTROPHYSIOLOGIC STUDY N/A 01/31/2015   Procedure: Atrial Fibrillation Ablation;  Surgeon: [name], MD;  Location: [facility] INVASIVE CV LAB;  Service: Cardiovascular;  Laterality: N/A;  . TEE WITHOUT CARDIOVERSION N/A 01/30/2015   Procedure: TRANSESOPHAGEAL ECHOCARDIOGRAM (TEE);  Surgeon: Wendall Stade, MD;  Location: [facility] ENDOSCOPY;  Service: Cardiovascular;  Laterality: N/A;    ROS- all systems are reviewed and negatives except as per HPI above  Current Outpatient Medications   Medication Sig Dispense Refill  . carvedilol (COREG) 25 MG tablet Take 25 mg by mouth 2 (two) times daily with a meal.     . diclofenac sodium (VOLTAREN) 1 % GEL Apply 2 g topically 4 (four) times daily.    Marland Kitchen ELIQUIS 5 MG TABS tablet TAKE 1 TABLET BY MOUTH TWICE A DAY 180 tablet 3  . Evolocumab (REPATHA SURECLICK) 140 MG/ML SOAJ Inject 1 pen into the skin every 14 (fourteen) days. 2 pen 11  . INVOKANA 300 MG TABS tablet Take 1 tablet by mouth daily.  4  . lisinopril-hydrochlorothiazide (PRINZIDE,ZESTORETIC) 20-25 MG per tablet Take 1 tablet by mouth daily.     . metFORMIN (GLUCOPHAGE) 1000 MG tablet Take 1,000 mg by mouth 2 (two) times daily with a meal.     . Multiple Vitamins-Minerals (MULTIVITAMIN ADULT PO) Take 1 tablet by mouth daily.    . Naproxen (NAPROSYN PO) Take by mouth 2 (two) times daily with a meal.     . nitroGLYCERIN (NITROSTAT) 0.4 MG SL tablet Place 1 tablet (0.4 mg total) under the tongue every 5 (five) minutes as needed for chest pain. 25 tablet 4  . NON FORMULARY Cpap 8cm H2O    . NOVOLOG MIX 70/30 (70-30) 100 UNIT/ML injection as directed. Sliding scale  4   No current facility-administered medications for this visit.    Physical Exam: Vitals:   03/10/20 0818  BP: (!) 144/76  Pulse: 63  SpO2: 98%  Weight: 275 lb (124.7 kg)  Height: 5' 10.5" (1.791 m)  GEN- The patient is well appearing, alert and oriented x 3 today.   Head- normocephalic, atraumatic Eyes-  Sclera clear, conjunctiva pink Ears- hearing intact Oropharynx- clear Lungs-  normal work of breathing Heart- Regular rate and rhythm  GI- soft  Extremities- no clubbing, cyanosis, or edema  Wt Readings from Last 3 Encounters:  03/10/20 275 lb (124.7 kg)  09/24/19 264 lb (119.7 kg)  01/01/19 264 lb (119.7 kg)    EKG tracing ordered today is personally reviewed and shows sinus rhythm 63 bpm  Assessment and Plan:  1. Paroxysmal atrial fibrillation Reports increasing afib chads2vasc score is  4.  He is on eliquis We discussed importance of lifestyle modification again today He is s/p PVI and CTI ablation by me in 2016.  He has done well since that time.  Unfortunately his afib has progressed.  Therapeutic strategies for afib including medicine and repeat ablation were discussed in detail with the patient today. Risk, benefits, and alternatives to EP study and radiofrequency ablation for afib were also discussed in detail today. These risks include but are not limited to stroke, bleeding, vascular damage, tamponade, perforation, damage to the esophagus, lungs, and other structures, pulmonary vein stenosis, worsening renal function, and death. The patient understands these risk and wishes to proceed.  We will therefore proceed with catheter ablation at the next available time.  Carto, ICE, anesthesia are requested for the procedure.  Will also obtain cardiac CT prior to the procedure to exclude LAA thrombus and further evaluate atrial anatomy. 2D echo is also ordered today  2. HTN Stable No change required today  3. OSA Compliance with therapy is advised Followed by Dr Earl Gala  4. Obesity Body mass index is 38.9 kg/m. Lifestyle modification is again advised  5. CAD No ischemic symptoms Will assess cors with cardiac CT prior to ablation if technically feasible  Risks, benefits and potential toxicities for medications prescribed and/or refilled reviewed with patient today.   [name] MD, [facility] 03/10/2020 8:29 AM

## 2020-04-07 ENCOUNTER — Other Ambulatory Visit: Payer: Medicare Other

## 2020-04-07 ENCOUNTER — Other Ambulatory Visit: Payer: Self-pay

## 2020-04-07 ENCOUNTER — Ambulatory Visit (HOSPITAL_COMMUNITY): Payer: Medicare Other | Attending: Cardiology

## 2020-04-07 DIAGNOSIS — I4891 Unspecified atrial fibrillation: Secondary | ICD-10-CM | POA: Diagnosis not present

## 2020-04-07 DIAGNOSIS — D6869 Other thrombophilia: Secondary | ICD-10-CM | POA: Diagnosis not present

## 2020-04-07 DIAGNOSIS — G4733 Obstructive sleep apnea (adult) (pediatric): Secondary | ICD-10-CM

## 2020-04-07 DIAGNOSIS — I480 Paroxysmal atrial fibrillation: Secondary | ICD-10-CM | POA: Insufficient documentation

## 2020-04-07 DIAGNOSIS — I10 Essential (primary) hypertension: Secondary | ICD-10-CM | POA: Insufficient documentation

## 2020-04-07 LAB — ECHOCARDIOGRAM COMPLETE
Area-P 1/2: 3.24 cm2
S' Lateral: 3.3 cm

## 2020-04-07 LAB — CBC WITH DIFFERENTIAL/PLATELET
Basophils Absolute: 0.1 10*3/uL (ref 0.0–0.2)
Basos: 1 %
EOS (ABSOLUTE): 0.3 10*3/uL (ref 0.0–0.4)
Eos: 3 %
Hematocrit: 40.6 % (ref 37.5–51.0)
Hemoglobin: 14.2 g/dL (ref 13.0–17.7)
Immature Grans (Abs): 0 10*3/uL (ref 0.0–0.1)
Immature Granulocytes: 0 %
Lymphocytes Absolute: 2.6 10*3/uL (ref 0.7–3.1)
Lymphs: 30 %
MCH: 31.1 pg (ref 26.6–33.0)
MCHC: 35 g/dL (ref 31.5–35.7)
MCV: 89 fL (ref 79–97)
Monocytes Absolute: 0.8 10*3/uL (ref 0.1–0.9)
Monocytes: 9 %
Neutrophils Absolute: 4.7 10*3/uL (ref 1.4–7.0)
Neutrophils: 57 %
Platelets: 260 10*3/uL (ref 150–450)
RBC: 4.56 x10E6/uL (ref 4.14–5.80)
RDW: 12.2 % (ref 11.6–15.4)
WBC: 8.4 10*3/uL (ref 3.4–10.8)

## 2020-04-07 LAB — BASIC METABOLIC PANEL
BUN/Creatinine Ratio: 19 (ref 10–24)
BUN: 24 mg/dL (ref 8–27)
CO2: 26 mmol/L (ref 20–29)
Calcium: 9.6 mg/dL (ref 8.6–10.2)
Chloride: 98 mmol/L (ref 96–106)
Creatinine, Ser: 1.28 mg/dL — ABNORMAL HIGH (ref 0.76–1.27)
GFR calc Af Amer: 66 mL/min/{1.73_m2} (ref >59)
GFR calc non Af Amer: 57 mL/min/{1.73_m2} — ABNORMAL LOW (ref >59)
Glucose: 149 mg/dL — ABNORMAL HIGH (ref 65–99)
Potassium: 4.8 mmol/L (ref 3.5–5.2)
Sodium: 136 mmol/L (ref 134–144)

## 2020-04-07 MED ORDER — PERFLUTREN LIPID MICROSPHERE
1.0000 mL | INTRAVENOUS | Status: AC | PRN
  Administered 2020-04-07: 1 mL via INTRAVENOUS

## 2020-04-28 ENCOUNTER — Telehealth (HOSPITAL_COMMUNITY): Payer: Self-pay | Admitting: Emergency Medicine

## 2020-04-28 NOTE — Telephone Encounter (Signed)
Attempted to call patient regarding upcoming cardiac CT appointment. °Left message on voicemail with name and callback number °Nial Hawe RN Navigator Cardiac Imaging °[facility] °[phone] Office °[phone] Cell ° °

## 2020-04-29 ENCOUNTER — Ambulatory Visit (HOSPITAL_COMMUNITY)
Admission: RE | Admit: 2020-04-29 | Discharge: 2020-04-29 | Disposition: A | Discharge status: Home / Self Care | Payer: Medicare Other | Source: Ambulatory Visit | Attending: Internal Medicine | Admitting: Internal Medicine

## 2020-04-29 ENCOUNTER — Encounter (HOSPITAL_COMMUNITY): Payer: Self-pay

## 2020-04-29 ENCOUNTER — Other Ambulatory Visit: Payer: Self-pay

## 2020-04-29 DIAGNOSIS — I4891 Unspecified atrial fibrillation: Secondary | ICD-10-CM | POA: Insufficient documentation

## 2020-04-29 MED ORDER — NITROGLYCERIN 0.4 MG SL SUBL: 0.8000 mg | SUBLINGUAL_TABLET | Freq: Once | SUBLINGUAL | Status: AC

## 2020-04-29 MED ORDER — NITROGLYCERIN 0.4 MG SL SUBL
SUBLINGUAL_TABLET | SUBLINGUAL | Status: AC
  Administered 2020-04-29: 0.8 mg via SUBLINGUAL
  Filled 2020-04-29: qty 2

## 2020-04-29 MED ORDER — IOHEXOL 350 MG/ML SOLN
80.0000 mL | Freq: Once | INTRAVENOUS | Status: AC | PRN
  Administered 2020-04-29: 80 mL via INTRAVENOUS

## 2020-04-30 ENCOUNTER — Ambulatory Visit (HOSPITAL_COMMUNITY)
Admission: RE | Admit: 2020-04-30 | Discharge: 2020-04-30 | Disposition: A | Discharge status: Home / Self Care | Payer: Medicare Other | Source: Ambulatory Visit | Attending: Internal Medicine | Admitting: Internal Medicine

## 2020-04-30 DIAGNOSIS — I480 Paroxysmal atrial fibrillation: Secondary | ICD-10-CM | POA: Diagnosis not present

## 2020-04-30 DIAGNOSIS — I2510 Atherosclerotic heart disease of native coronary artery without angina pectoris: Secondary | ICD-10-CM | POA: Diagnosis not present

## 2020-04-30 DIAGNOSIS — D6869 Other thrombophilia: Secondary | ICD-10-CM | POA: Diagnosis not present

## 2020-04-30 DIAGNOSIS — I10 Essential (primary) hypertension: Secondary | ICD-10-CM | POA: Insufficient documentation

## 2020-04-30 DIAGNOSIS — G4733 Obstructive sleep apnea (adult) (pediatric): Secondary | ICD-10-CM | POA: Diagnosis not present

## 2020-04-30 DIAGNOSIS — I4891 Unspecified atrial fibrillation: Secondary | ICD-10-CM | POA: Diagnosis not present

## 2020-05-03 ENCOUNTER — Inpatient Hospital Stay (HOSPITAL_COMMUNITY): Admission: RE | Admit: 2020-05-03 | Payer: Medicare Other | Source: Ambulatory Visit

## 2020-05-05 ENCOUNTER — Other Ambulatory Visit (HOSPITAL_COMMUNITY)
Admission: RE | Admit: 2020-05-05 | Discharge: 2020-05-05 | Disposition: A | Discharge status: Home / Self Care | Payer: Medicare Other | Source: Ambulatory Visit | Attending: Internal Medicine | Admitting: Internal Medicine

## 2020-05-05 ENCOUNTER — Telehealth: Payer: Self-pay | Admitting: Internal Medicine

## 2020-05-05 ENCOUNTER — Telehealth: Payer: Self-pay | Admitting: *Deleted

## 2020-05-05 DIAGNOSIS — Z01812 Encounter for preprocedural laboratory examination: Secondary | ICD-10-CM | POA: Insufficient documentation

## 2020-05-05 DIAGNOSIS — Z20822 Contact with and (suspected) exposure to covid-19: Secondary | ICD-10-CM | POA: Diagnosis not present

## 2020-05-05 LAB — SARS CORONAVIRUS 2 (TAT 6-24 HRS): SARS Coronavirus 2: NEGATIVE

## 2020-05-05 NOTE — Telephone Encounter (Signed)
New Message:    Pt was supposed to have COVID Test on Saturday(05-03-20), did not get it. He had an emergency. He is supposed to have an Ablation tomorrow. He wants to know what does he need to do?

## 2020-05-05 NOTE — Telephone Encounter (Signed)
Scheduled covid screening today. Patient aware. Advised to get there at soon as possible. (45 min drive)

## 2020-05-05 NOTE — Anesthesia Preprocedure Evaluation (Addendum)
Anesthesia Evaluation  Patient identified by MRN, date of birth, ID band Patient awake    Reviewed: Allergy & Precautions, NPO status , Patient's Chart, lab work & pertinent test results  Airway Mallampati: III  TM Distance: >3 FB Neck ROM: Full    Dental  (+) Dental Advisory Given   Pulmonary sleep apnea and Continuous Positive Airway Pressure Ventilation ,    breath sounds clear to auscultation       Cardiovascular hypertension, Pt. on medications and Pt. on home beta blockers + dysrhythmias  Rhythm:Regular Rate:Normal  Normal EF. Valves okay   Neuro/Psych negative neurological ROS     GI/Hepatic negative GI ROS, Neg liver ROS,   Endo/Other  diabetesMorbid obesity  Renal/GU negative Renal ROS     Musculoskeletal  (+) Arthritis ,   Abdominal   Peds  Hematology negative hematology ROS (+)   Anesthesia Other Findings   Reproductive/Obstetrics                             Lab Results  Component Value Date   WBC 8.4 04/07/2020   HGB 14.2 04/07/2020   HCT 40.6 04/07/2020   MCV 89 04/07/2020   PLT 260 04/07/2020   Lab Results  Component Value Date   CREATININE 1.28 (H) 04/07/2020   BUN 24 04/07/2020   NA 136 04/07/2020   K 4.8 04/07/2020   CL 98 04/07/2020   CO2 26 04/07/2020    Anesthesia Physical Anesthesia Plan  ASA: III  Anesthesia Plan: General   Post-op Pain Management:    Induction: Intravenous  PONV Risk Score and Plan: 2 and Dexamethasone, Ondansetron and Treatment may vary due to age or medical condition  Airway Management Planned: Oral ETT  Additional Equipment:   Intra-op Plan:   Post-operative Plan: Extubation in OR  Informed Consent: I have reviewed the patients History and Physical, chart, labs and discussed the procedure including the risks, benefits and alternatives for the proposed anesthesia with the patient or authorized representative who has  indicated his/her understanding and acceptance.     Dental advisory given  Plan Discussed with: CRNA  Anesthesia Plan Comments:        Anesthesia Quick Evaluation

## 2020-05-05 NOTE — Progress Notes (Signed)
Attempted to call patient regarding instructions for tomorrow's procedure. Left voicemail on the following items: Arrival time 0530 Nothing to eat or drink after midnight No meds AM of procedure Responsible person to drive you home and stay with you for 24 hrs  Have you missed any doses of anti-coagulant Eliquis- take both doses today, none in the morning

## 2020-05-05 NOTE — Telephone Encounter (Signed)
Patient called the Anticoagulation Clinic regarding him missing his Covid Test Appt on Saturday and wants to know what to do. Advised that I could not tell him what to do since he has called the Anticoagulation Clinic but I could get the message to someone that could assist. He was thankful for the assistance.  Spoke with Triage Ann, RN and she states she can assist with the matter. Therefore, will forward to her to follow-up.

## 2020-05-06 ENCOUNTER — Encounter (HOSPITAL_COMMUNITY)
Admission: RE | Disposition: A | Discharge status: Home / Self Care | Payer: Self-pay | Source: Home / Self Care | Attending: Internal Medicine

## 2020-05-06 ENCOUNTER — Ambulatory Visit (HOSPITAL_COMMUNITY): Payer: Medicare Other | Admitting: Certified Registered"

## 2020-05-06 ENCOUNTER — Ambulatory Visit (HOSPITAL_COMMUNITY)
Admission: RE | Admit: 2020-05-06 | Discharge: 2020-05-06 | Disposition: A | Discharge status: Home / Self Care | Payer: Medicare Other | Attending: Internal Medicine | Admitting: Internal Medicine

## 2020-05-06 ENCOUNTER — Other Ambulatory Visit: Payer: Self-pay

## 2020-05-06 DIAGNOSIS — Z794 Long term (current) use of insulin: Secondary | ICD-10-CM | POA: Insufficient documentation

## 2020-05-06 DIAGNOSIS — I10 Essential (primary) hypertension: Secondary | ICD-10-CM | POA: Diagnosis not present

## 2020-05-06 DIAGNOSIS — I480 Paroxysmal atrial fibrillation: Secondary | ICD-10-CM | POA: Insufficient documentation

## 2020-05-06 DIAGNOSIS — I2510 Atherosclerotic heart disease of native coronary artery without angina pectoris: Secondary | ICD-10-CM | POA: Diagnosis not present

## 2020-05-06 DIAGNOSIS — Z8249 Family history of ischemic heart disease and other diseases of the circulatory system: Secondary | ICD-10-CM | POA: Diagnosis not present

## 2020-05-06 DIAGNOSIS — Z791 Long term (current) use of non-steroidal anti-inflammatories (NSAID): Secondary | ICD-10-CM | POA: Insufficient documentation

## 2020-05-06 DIAGNOSIS — Z79899 Other long term (current) drug therapy: Secondary | ICD-10-CM | POA: Diagnosis not present

## 2020-05-06 DIAGNOSIS — I214 Non-ST elevation (NSTEMI) myocardial infarction: Secondary | ICD-10-CM | POA: Diagnosis not present

## 2020-05-06 DIAGNOSIS — Z7901 Long term (current) use of anticoagulants: Secondary | ICD-10-CM | POA: Diagnosis not present

## 2020-05-06 DIAGNOSIS — Z955 Presence of coronary angioplasty implant and graft: Secondary | ICD-10-CM | POA: Diagnosis not present

## 2020-05-06 HISTORY — PX: ATRIAL FIBRILLATION ABLATION: EP1191

## 2020-05-06 LAB — GLUCOSE, CAPILLARY
Glucose-Capillary: 160 mg/dL — ABNORMAL HIGH (ref 70–99)
Glucose-Capillary: 166 mg/dL — ABNORMAL HIGH (ref 70–99)

## 2020-05-06 LAB — POCT ACTIVATED CLOTTING TIME: Activated Clotting Time: 333 seconds

## 2020-05-06 SURGERY — ATRIAL FIBRILLATION ABLATION
Anesthesia: General

## 2020-05-06 MED ORDER — LACTATED RINGERS IV SOLN: INTRAVENOUS | Status: DC | PRN

## 2020-05-06 MED ORDER — AMISULPRIDE (ANTIEMETIC) 5 MG/2ML IV SOLN: 10.0000 mg | Freq: Once | INTRAVENOUS | Status: DC | PRN

## 2020-05-06 MED ORDER — SUCCINYLCHOLINE CHLORIDE 200 MG/10ML IV SOSY
PREFILLED_SYRINGE | INTRAVENOUS | Status: DC | PRN
  Administered 2020-05-06: 120 mg via INTRAVENOUS

## 2020-05-06 MED ORDER — SODIUM CHLORIDE 0.9 % IV SOLN: 250.0000 mL | INTRAVENOUS | Status: DC | PRN

## 2020-05-06 MED ORDER — FENTANYL CITRATE (PF) 100 MCG/2ML IJ SOLN
INTRAMUSCULAR | Status: DC | PRN
  Administered 2020-05-06 (×2): 50 ug via INTRAVENOUS

## 2020-05-06 MED ORDER — APIXABAN 5 MG PO TABS
5.0000 mg | ORAL_TABLET | ORAL | Status: AC
  Administered 2020-05-06: 5 mg via ORAL
  Filled 2020-05-06: qty 1

## 2020-05-06 MED ORDER — ISOPROTERENOL HCL 0.2 MG/ML IJ SOLN
INTRAMUSCULAR | Status: AC
  Filled 2020-05-06: qty 5

## 2020-05-06 MED ORDER — HEPARIN (PORCINE) IN NACL 1000-0.9 UT/500ML-% IV SOLN
INTRAVENOUS | Status: DC | PRN
  Administered 2020-05-06: 500 mL

## 2020-05-06 MED ORDER — PHENYLEPHRINE HCL-NACL 10-0.9 MG/250ML-% IV SOLN
INTRAVENOUS | Status: DC | PRN
  Administered 2020-05-06: 15 ug/min via INTRAVENOUS

## 2020-05-06 MED ORDER — HEPARIN (PORCINE) IN NACL 2000-0.9 UNIT/L-% IV SOLN
INTRAVENOUS | Status: DC | PRN
  Administered 2020-05-06: 1000 mL

## 2020-05-06 MED ORDER — PANTOPRAZOLE SODIUM 40 MG PO TBEC: 40.0000 mg | DELAYED_RELEASE_TABLET | Freq: Every day | ORAL | 0 refills | Status: DC

## 2020-05-06 MED ORDER — HYDROCODONE-ACETAMINOPHEN 5-325 MG PO TABS: 1.0000 | ORAL_TABLET | ORAL | Status: DC | PRN

## 2020-05-06 MED ORDER — HEPARIN SODIUM (PORCINE) 1000 UNIT/ML IJ SOLN
INTRAMUSCULAR | Status: DC | PRN
  Administered 2020-05-06: 14000 [IU] via INTRAVENOUS

## 2020-05-06 MED ORDER — LIDOCAINE 2% (20 MG/ML) 5 ML SYRINGE
INTRAMUSCULAR | Status: DC | PRN
  Administered 2020-05-06: 100 mg via INTRAVENOUS

## 2020-05-06 MED ORDER — DEXAMETHASONE SODIUM PHOSPHATE 10 MG/ML IJ SOLN
INTRAMUSCULAR | Status: DC | PRN
  Administered 2020-05-06: 4 mg via INTRAVENOUS

## 2020-05-06 MED ORDER — HEPARIN SODIUM (PORCINE) 1000 UNIT/ML IJ SOLN
INTRAMUSCULAR | Status: AC
  Filled 2020-05-06: qty 2

## 2020-05-06 MED ORDER — SODIUM CHLORIDE 0.9% FLUSH: 3.0000 mL | Freq: Two times a day (BID) | INTRAVENOUS | Status: DC

## 2020-05-06 MED ORDER — PROPOFOL 10 MG/ML IV BOLUS
INTRAVENOUS | Status: DC | PRN
Start: 2020-05-06 — End: 2020-05-06
  Administered 2020-05-06: 170 mg via INTRAVENOUS
  Administered 2020-05-06: 30 mg via INTRAVENOUS

## 2020-05-06 MED ORDER — FENTANYL CITRATE (PF) 100 MCG/2ML IJ SOLN: 25.0000 ug | INTRAMUSCULAR | Status: DC | PRN

## 2020-05-06 MED ORDER — PROTAMINE SULFATE 10 MG/ML IV SOLN
INTRAVENOUS | Status: DC | PRN
Start: 2020-05-06 — End: 2020-05-06
  Administered 2020-05-06: 40 mg via INTRAVENOUS

## 2020-05-06 MED ORDER — ACETAMINOPHEN 325 MG PO TABS: 650.0000 mg | ORAL_TABLET | ORAL | Status: DC | PRN

## 2020-05-06 MED ORDER — SODIUM CHLORIDE 0.9 % IV SOLN: INTRAVENOUS | Status: DC

## 2020-05-06 MED ORDER — SODIUM CHLORIDE 0.9% FLUSH: 3.0000 mL | INTRAVENOUS | Status: DC | PRN

## 2020-05-06 MED ORDER — ISOPROTERENOL HCL 0.2 MG/ML IJ SOLN
INTRAVENOUS | Status: DC | PRN
  Administered 2020-05-06: 10 ug/min via INTRAVENOUS

## 2020-05-06 MED ORDER — ONDANSETRON HCL 4 MG/2ML IJ SOLN: 4.0000 mg | Freq: Four times a day (QID) | INTRAMUSCULAR | Status: DC | PRN

## 2020-05-06 SURGICAL SUPPLY — 19 items
BLANKET WARM UNDERBOD FULL ACC (MISCELLANEOUS) ×2 IMPLANT
CATH 8FR REPROCESSED SOUNDSTAR (CATHETERS) ×2 IMPLANT
CATH MAPPNG PENTARAY F 2-6-2MM (CATHETERS) ×1 IMPLANT
CATH SMTCH THERMOCOOL SF DF (CATHETERS) ×2 IMPLANT
CATH WEB BI DIR CSDF CRV REPRO (CATHETERS) ×2 IMPLANT
CLOSURE PERCLOSE PROSTYLE (VASCULAR PRODUCTS) ×6 IMPLANT
COVER SWIFTLINK CONNECTOR (BAG) ×2 IMPLANT
INTRODUCER SWARTZ SL2 8F (SHEATH) ×2 IMPLANT
MAT PREVALON FULL STRYKER (MISCELLANEOUS) ×2 IMPLANT
NEEDLE BAYLIS TRANSSEPTAL 71CM (NEEDLE) ×2 IMPLANT
PACK EP LATEX FREE (CUSTOM PROCEDURE TRAY) ×1
PACK EP LF (CUSTOM PROCEDURE TRAY) ×1 IMPLANT
PAD PRO RADIOLUCENT 2001M-C (PAD) ×2 IMPLANT
PATCH CARTO3 (PAD) ×2 IMPLANT
PENTARAY F 2-6-2MM (CATHETERS) ×2
SHEATH PINNACLE 7F 10CM (SHEATH) ×4 IMPLANT
SHEATH PINNACLE 9F 10CM (SHEATH) ×2 IMPLANT
SHEATH PROBE COVER 6X72 (BAG) ×2 IMPLANT
TUBING SMART ABLATE COOLFLOW (TUBING) ×2 IMPLANT

## 2020-05-06 NOTE — Transfer of Care (Signed)
Immediate Anesthesia Transfer of Care Note  Patient: Dennis King  Procedure(s) Performed: ATRIAL FIBRILLATION ABLATION (N/A )  Patient Location: PACU  Anesthesia Type:General  Level of Consciousness: awake, alert , oriented and patient cooperative  Airway & Oxygen Therapy: Patient Spontanous Breathing and Patient connected to face mask oxygen  Post-op Assessment: Report given to RN and Post -op Vital signs reviewed and stable  Post vital signs: Reviewed and stable  Last Vitals:  Vitals Value Taken Time  BP    Temp    Pulse 67 05/06/20 0938  Resp 10 05/06/20 0938  SpO2 92 % 05/06/20 0938  Vitals shown include unvalidated device data.  Last Pain:  Vitals:   05/06/20 0606  TempSrc:   PainSc: 0-No pain         Complications: No complications documented.

## 2020-05-06 NOTE — Anesthesia Postprocedure Evaluation (Signed)
Anesthesia Post Note  Patient: Dennis King  Procedure(s) Performed: ATRIAL FIBRILLATION ABLATION (N/A )     Patient location during evaluation: PACU Anesthesia Type: General Level of consciousness: awake and alert Pain management: pain level controlled Vital Signs Assessment: post-procedure vital signs reviewed and stable Respiratory status: spontaneous breathing, nonlabored ventilation, respiratory function stable and patient connected to nasal cannula oxygen Cardiovascular status: blood pressure returned to baseline and stable Postop Assessment: no apparent nausea or vomiting Anesthetic complications: no   No complications documented.  Last Vitals:  Vitals:   05/06/20 1015 05/06/20 1020  BP: (!) 141/59 (!) 148/58  Pulse: 67 66  Resp: 11 12  Temp:    SpO2: 94% 92%    Last Pain:  Vitals:   05/06/20 0938  TempSrc: Temporal  PainSc: 0-No pain                 Kennieth Rad

## 2020-05-06 NOTE — Discharge Instructions (Signed)

## 2020-05-06 NOTE — Anesthesia Procedure Notes (Signed)
Procedure Name: Intubation Date/Time: 05/06/2020 7:45 AM Performed by: Cleda Daub, CRNA Pre-anesthesia Checklist: Patient identified, Emergency Drugs available, Suction available and Patient being monitored Patient Re-evaluated:Patient Re-evaluated prior to induction Oxygen Delivery Method: Circle system utilized Preoxygenation: Pre-oxygenation with 100% oxygen Induction Type: IV induction Ventilation: Mask ventilation without difficulty Laryngoscope Size: McGraph and 4 Grade View: Grade I Tube type: Oral Tube size: 7.5 mm Number of attempts: 2 (grade 3 view with MAC 3 on the first attempt. ) Airway Equipment and Method: Stylet and Oral airway Placement Confirmation: ETT inserted through vocal cords under direct vision,  positive ETCO2 and breath sounds checked- equal and bilateral Secured at: 23 cm Tube secured with: Tape Dental Injury: Teeth and Oropharynx as per pre-operative assessment  Difficulty Due To: Difficult Airway- due to anterior larynx

## 2020-05-06 NOTE — H&P (Signed)
Dennis King is a 69 y.o. male who presents today for routine electrophysiology study and ablation of afib.  Since last being seen in our clinic, the patient reports doing very well. He reports increasing frequency and duration of his afib.  + decreased exercise tolerance with his afib.   Today, he denies symptoms of palpitations, chest pain, shortness of breath,  lower extremity edema, dizziness, presyncope, or syncope.  The patient is otherwise without complaint today.       Past Medical History:  Diagnosis Date  . Arthritis   . Coronary artery disease    past post circumflex obtuse marginal branch cutting balloon atherectomy by Dr. [name]  . Family history of heart disease   . Hyperlipidemia   . Hypertension   . Obesity   . Obstructive sleep apnea    on C Pap  . Paroxysmal A-fib (HCC)   . Type 2 diabetes mellitus (HCC)         Past Surgical History:  Procedure Laterality Date  . APPENDECTOMY    . CARDIAC CATHETERIZATION N/A 10/22/2014   Procedure: Left Heart Cath and Coronary Angiography;  Surgeon: Corky Crafts, MD;  Location: [facility] INVASIVE CV LAB;  Service: Cardiovascular;  Laterality: N/A;  . CARDIAC CATHETERIZATION Right 10/22/2014   Procedure: Coronary Stent Intervention;  Surgeon: Corky Crafts, MD;  Location: [facility] INVASIVE CV LAB;  Service: Cardiovascular;  Laterality: Right;  . ELECTROPHYSIOLOGIC STUDY N/A 01/31/2015   Procedure: Atrial Fibrillation Ablation;  Surgeon: [name], MD;  Location: [facility] INVASIVE CV LAB;  Service: Cardiovascular;  Laterality: N/A;  . TEE WITHOUT CARDIOVERSION N/A 01/30/2015   Procedure: TRANSESOPHAGEAL ECHOCARDIOGRAM (TEE);  Surgeon: Wendall Stade, MD;  Location: [facility] ENDOSCOPY;  Service: Cardiovascular;  Laterality: N/A;    ROS- all systems are reviewed and negatives except as per HPI above        Current Outpatient Medications  Medication Sig Dispense Refill  . carvedilol (COREG) 25 MG tablet Take 25 mg by  mouth 2 (two) times daily with a meal.     . diclofenac sodium (VOLTAREN) 1 % GEL Apply 2 g topically 4 (four) times daily.    Marland Kitchen ELIQUIS 5 MG TABS tablet TAKE 1 TABLET BY MOUTH TWICE A DAY 180 tablet 3  . Evolocumab (REPATHA SURECLICK) 140 MG/ML SOAJ Inject 1 pen into the skin every 14 (fourteen) days. 2 pen 11  . INVOKANA 300 MG TABS tablet Take 1 tablet by mouth daily.  4  . lisinopril-hydrochlorothiazide (PRINZIDE,ZESTORETIC) 20-25 MG per tablet Take 1 tablet by mouth daily.     . metFORMIN (GLUCOPHAGE) 1000 MG tablet Take 1,000 mg by mouth 2 (two) times daily with a meal.     . Multiple Vitamins-Minerals (MULTIVITAMIN ADULT PO) Take 1 tablet by mouth daily.    . Naproxen (NAPROSYN PO) Take by mouth 2 (two) times daily with a meal.     . nitroGLYCERIN (NITROSTAT) 0.4 MG SL tablet Place 1 tablet (0.4 mg total) under the tongue every 5 (five) minutes as needed for chest pain. 25 tablet 4  . NON FORMULARY Cpap 8cm H2O    . NOVOLOG MIX 70/30 (70-30) 100 UNIT/ML injection as directed. Sliding scale  4   No current facility-administered medications for this visit.    Physical Exam: Vitals:   05/06/20 0543  BP: (!) 142/57  Pulse: 69  Resp: 16  Temp: 97.7 F (36.5 C)  SpO2: 99%   GEN- The patient is well appearing, alert and oriented x  3 today.   Head- normocephalic, atraumatic Eyes-  Sclera clear, conjunctiva pink Ears- hearing intact Oropharynx- clear Lungs-  normal work of breathing Heart- Regular rate and rhythm  GI- soft  Extremities- no clubbing, cyanosis, or edema     Wt Readings from Last 3 Encounters:  03/10/20 275 lb (124.7 kg)  09/24/19 264 lb (119.7 kg)  01/01/19 264 lb (119.7 kg)   Assessment and Plan:  1. Paroxysmal atrial fibrillation Reports increasing afib chads2vasc score is 4.  He is on eliquis We discussed importance of lifestyle modification again today He is s/p PVI and CTI ablation by me in 2016.  He has done well since that time.   Unfortunately his afib has progressed.  Cardiac CT reviewed with him at length.  He is instructed to follow-up with PCP for further evaluation and management of lung nodules.  He reports compliance with eliquis without interruption.  No ischemic symptoms   Risk, benefits, and alternatives to EP study and radiofrequency ablation for afib were also discussed in detail today. These risks include but are not limited to stroke, bleeding, vascular damage, tamponade, perforation, damage to the esophagus, lungs, and other structures, pulmonary vein stenosis, worsening renal function, and death. The patient understands these risk and wishes to proceed.     [name] MD, [facility] 05/06/2020 7:11 AM

## 2020-05-07 ENCOUNTER — Encounter (HOSPITAL_COMMUNITY): Payer: Self-pay | Admitting: Internal Medicine

## 2020-05-09 DIAGNOSIS — M159 Polyosteoarthritis, unspecified: Secondary | ICD-10-CM | POA: Diagnosis not present

## 2020-05-09 DIAGNOSIS — I10 Essential (primary) hypertension: Secondary | ICD-10-CM | POA: Diagnosis not present

## 2020-05-09 DIAGNOSIS — Z125 Encounter for screening for malignant neoplasm of prostate: Secondary | ICD-10-CM | POA: Diagnosis not present

## 2020-05-09 DIAGNOSIS — R918 Other nonspecific abnormal finding of lung field: Secondary | ICD-10-CM | POA: Diagnosis not present

## 2020-05-09 DIAGNOSIS — E782 Mixed hyperlipidemia: Secondary | ICD-10-CM | POA: Diagnosis not present

## 2020-05-09 DIAGNOSIS — Z23 Encounter for immunization: Secondary | ICD-10-CM | POA: Diagnosis not present

## 2020-05-09 DIAGNOSIS — E1142 Type 2 diabetes mellitus with diabetic polyneuropathy: Secondary | ICD-10-CM | POA: Diagnosis not present

## 2020-05-09 DIAGNOSIS — I2510 Atherosclerotic heart disease of native coronary artery without angina pectoris: Secondary | ICD-10-CM | POA: Diagnosis not present

## 2020-05-09 DIAGNOSIS — I480 Paroxysmal atrial fibrillation: Secondary | ICD-10-CM | POA: Diagnosis not present

## 2020-05-09 DIAGNOSIS — Z0000 Encounter for general adult medical examination without abnormal findings: Secondary | ICD-10-CM | POA: Diagnosis not present

## 2020-05-09 DIAGNOSIS — N39498 Other specified urinary incontinence: Secondary | ICD-10-CM | POA: Diagnosis not present

## 2020-05-09 DIAGNOSIS — G4733 Obstructive sleep apnea (adult) (pediatric): Secondary | ICD-10-CM | POA: Diagnosis not present

## 2020-05-12 ENCOUNTER — Other Ambulatory Visit: Payer: Self-pay | Admitting: Family Medicine

## 2020-05-13 ENCOUNTER — Other Ambulatory Visit: Payer: Self-pay | Admitting: Family Medicine

## 2020-05-13 DIAGNOSIS — R918 Other nonspecific abnormal finding of lung field: Secondary | ICD-10-CM

## 2020-06-03 ENCOUNTER — Ambulatory Visit (HOSPITAL_COMMUNITY): Payer: Medicare Other | Admitting: Physician Assistant

## 2020-06-10 ENCOUNTER — Encounter (HOSPITAL_COMMUNITY): Payer: Self-pay

## 2020-06-10 ENCOUNTER — Ambulatory Visit (HOSPITAL_COMMUNITY): Payer: Medicare Other | Admitting: Nurse Practitioner

## 2020-06-10 ENCOUNTER — Ambulatory Visit (HOSPITAL_COMMUNITY): Payer: Medicare Other | Admitting: Physician Assistant

## 2020-06-13 ENCOUNTER — Other Ambulatory Visit: Payer: Self-pay | Admitting: Internal Medicine

## 2020-06-20 ENCOUNTER — Encounter (HOSPITAL_COMMUNITY): Payer: Self-pay

## 2020-06-20 ENCOUNTER — Other Ambulatory Visit: Payer: Self-pay | Admitting: Internal Medicine

## 2020-06-20 DIAGNOSIS — E7800 Pure hypercholesterolemia, unspecified: Secondary | ICD-10-CM

## 2020-08-06 ENCOUNTER — Ambulatory Visit (INDEPENDENT_AMBULATORY_CARE_PROVIDER_SITE_OTHER): Payer: Medicare Other | Admitting: Internal Medicine

## 2020-08-06 ENCOUNTER — Encounter: Payer: Self-pay | Admitting: Internal Medicine

## 2020-08-06 ENCOUNTER — Other Ambulatory Visit: Payer: Self-pay

## 2020-08-06 VITALS — BP 130/72 | HR 131 | Ht 71.0 in | Wt 265.0 lb

## 2020-08-06 DIAGNOSIS — I480 Paroxysmal atrial fibrillation: Secondary | ICD-10-CM

## 2020-08-06 DIAGNOSIS — G4733 Obstructive sleep apnea (adult) (pediatric): Secondary | ICD-10-CM

## 2020-08-06 DIAGNOSIS — I10 Essential (primary) hypertension: Secondary | ICD-10-CM

## 2020-08-06 DIAGNOSIS — I2510 Atherosclerotic heart disease of native coronary artery without angina pectoris: Secondary | ICD-10-CM

## 2020-08-06 NOTE — Patient Instructions (Addendum)
Medication Instructions:  Your physician recommends that you continue on your current medications as directed. Please refer to the Current Medication list given to you today.  Labwork: None ordered.  Testing/Procedures: None ordered.  Follow-Up: Your physician wants you to follow-up in: 11/07/20 at 9:45 am with [name], MD    Any Other Special Instructions Will Be Listed Below (If Applicable).  If you need a refill on your cardiac medications before your next appointment, please call your pharmacy.

## 2020-08-06 NOTE — Progress Notes (Signed)
PCP: Henrine Screws, MD    Dennis King is a 70 y.o. male who presents today for routine electrophysiology followup.  Since his recent afib ablation, the patient reports doing very well.  he denies procedure related complications and is pleased with the results of the procedure.  Unfortunately, he has had ERAF.  He reports 5 episodes of afib over the past 3 months.   Most recently, he converted to afib this morning.  Today, he denies symptoms of chest pain, shortness of breath,  lower extremity edema, dizziness, presyncope, or syncope.  The patient is otherwise without complaint today.   Past Medical History:  Diagnosis Date  . Arthritis   . Coronary artery disease    past post circumflex obtuse marginal branch cutting balloon atherectomy by Dr. [name]  . Family history of heart disease   . Hyperlipidemia   . Hypertension   . Obesity   . Obstructive sleep apnea    on C Pap  . Paroxysmal A-fib (HCC)   . Type 2 diabetes mellitus (HCC)    Past Surgical History:  Procedure Laterality Date  . APPENDECTOMY    . ATRIAL FIBRILLATION ABLATION N/A 05/06/2020   Procedure: ATRIAL FIBRILLATION ABLATION;  Surgeon: [name], MD;  Location: [facility] INVASIVE CV LAB;  Service: Cardiovascular;  Laterality: N/A;  . CARDIAC CATHETERIZATION N/A 10/22/2014   Procedure: Left Heart Cath and Coronary Angiography;  Surgeon: Corky Crafts, MD;  Location: [facility] INVASIVE CV LAB;  Service: Cardiovascular;  Laterality: N/A;  . CARDIAC CATHETERIZATION Right 10/22/2014   Procedure: Coronary Stent Intervention;  Surgeon: Corky Crafts, MD;  Location: [facility] INVASIVE CV LAB;  Service: Cardiovascular;  Laterality: Right;  . ELECTROPHYSIOLOGIC STUDY N/A 01/31/2015   Procedure: Atrial Fibrillation Ablation;  Surgeon: [name], MD;  Location: [facility] INVASIVE CV LAB;  Service: Cardiovascular;  Laterality: N/A;  . TEE WITHOUT CARDIOVERSION N/A 01/30/2015   Procedure: TRANSESOPHAGEAL ECHOCARDIOGRAM (TEE);  Surgeon:  Wendall Stade, MD;  Location: [facility] ENDOSCOPY;  Service: Cardiovascular;  Laterality: N/A;    ROS- all systems are personally reviewed and negatives except as per HPI above  Current Outpatient Medications  Medication Sig Dispense Refill  . carvedilol (COREG) 25 MG tablet Take 25 mg by mouth 2 (two) times daily with a meal.     . cholecalciferol (VITAMIN D) 25 MCG (1000 UNIT) tablet Take 1,000 Units by mouth every evening.    . diclofenac sodium (VOLTAREN) 1 % GEL Apply 1 application topically 3 (three) times daily as needed (pain relief).    Marland Kitchen ELIQUIS 5 MG TABS tablet TAKE 1 TABLET BY MOUTH TWICE A DAY 180 tablet 3  . INVOKANA 300 MG TABS tablet Take 300 mg by mouth daily.  4  . lisinopril-hydrochlorothiazide (PRINZIDE,ZESTORETIC) 20-25 MG per tablet Take 1 tablet by mouth daily.     . metFORMIN (GLUCOPHAGE) 1000 MG tablet Take 1,000 mg by mouth 2 (two) times daily with a meal.     . Multiple Vitamin (MULTIVITAMIN WITH MINERALS) TABS tablet Take 1 tablet by mouth daily.    . naproxen sodium (ALEVE) 220 MG tablet Take 440 mg by mouth in the morning and at bedtime.    . nitroGLYCERIN (NITROSTAT) 0.4 MG SL tablet Place 1 tablet (0.4 mg total) under the tongue every 5 (five) minutes as needed for chest pain. 25 tablet 4  . NON FORMULARY Cpap 8cm H2O    . NOVOLOG MIX 70/30 (70-30) 100 UNIT/ML injection Inject 20-50 Units into the skin in  the morning and at bedtime. Sliding scale  4  . REPATHA SURECLICK 140 MG/ML SOAJ INJECT 1 PEN INTO THE SKIN EVERY 14 (FOURTEEN) DAYS. 2 mL 11   No current facility-administered medications for this visit.    Physical Exam: Vitals:   08/06/20 1101  BP: 130/72  Pulse: (!) 131  SpO2: 97%  Weight: 265 lb (120.2 kg)  Height: 5\' 11"  (1.803 m)     GEN- The patient is well appearing, alert and oriented x 3 today.   Head- normocephalic, atraumatic Eyes-  Sclera clear, conjunctiva pink Ears- hearing intact Oropharynx- clear Lungs-   normal work of  breathing Heart- irregular rate and rhythm  GI- soft  Extremities- no clubbing, cyanosis, or edema  EKG tracing ordered today is personally reviewed and shows afib  Assessment and Plan:  1. Paroxysmal atrial fibrillation Doing well s/p ablation x 2 chads2vasc score is 4.  He is on eliquis In afib today We will revisit in 3 months If he becomes persistently in afib, he will require cardioversion If he continues to have afib, his AAD options are multaq, tikosyn, or amiodarone.  2. HTN Stable No change required today  3. OSA Compliant with therapy Follows with Dr  4. CAD No ischemic symptoms S/p prior PTCA  5. Obesity Body mass index is 36.96 kg/m. Lifestyle modification advised   Return to see me in 3 months He will contact the AF clinic with AF issues in the interim.  Earl Gala MD, [facility] 08/06/2020 11:24 AM

## 2020-09-12 ENCOUNTER — Ambulatory Visit
Admission: RE | Admit: 2020-09-12 | Discharge: 2020-09-12 | Disposition: A | Discharge status: Home / Self Care | Payer: Medicare Other | Source: Ambulatory Visit | Attending: Family Medicine | Admitting: Family Medicine

## 2020-09-12 DIAGNOSIS — R918 Other nonspecific abnormal finding of lung field: Secondary | ICD-10-CM | POA: Diagnosis not present

## 2020-10-25 ENCOUNTER — Other Ambulatory Visit: Payer: Self-pay | Admitting: Cardiovascular Disease

## 2020-10-30 DIAGNOSIS — G4733 Obstructive sleep apnea (adult) (pediatric): Secondary | ICD-10-CM | POA: Diagnosis not present

## 2020-11-07 ENCOUNTER — Other Ambulatory Visit: Payer: Self-pay | Admitting: Family Medicine

## 2020-11-07 ENCOUNTER — Ambulatory Visit (INDEPENDENT_AMBULATORY_CARE_PROVIDER_SITE_OTHER): Payer: Medicare Other | Admitting: Internal Medicine

## 2020-11-07 ENCOUNTER — Encounter: Payer: Self-pay | Admitting: Internal Medicine

## 2020-11-07 ENCOUNTER — Other Ambulatory Visit: Payer: Self-pay

## 2020-11-07 VITALS — BP 138/74 | HR 72 | Ht 71.0 in | Wt 272.8 lb

## 2020-11-07 DIAGNOSIS — G4733 Obstructive sleep apnea (adult) (pediatric): Secondary | ICD-10-CM | POA: Diagnosis not present

## 2020-11-07 DIAGNOSIS — I10 Essential (primary) hypertension: Secondary | ICD-10-CM

## 2020-11-07 DIAGNOSIS — R918 Other nonspecific abnormal finding of lung field: Secondary | ICD-10-CM

## 2020-11-07 DIAGNOSIS — I2510 Atherosclerotic heart disease of native coronary artery without angina pectoris: Secondary | ICD-10-CM | POA: Diagnosis not present

## 2020-11-07 DIAGNOSIS — I480 Paroxysmal atrial fibrillation: Secondary | ICD-10-CM

## 2020-11-07 MED ORDER — MULTAQ 400 MG PO TABS: 400.0000 mg | ORAL_TABLET | Freq: Two times a day (BID) | ORAL | 3 refills | Status: DC

## 2020-11-07 NOTE — Patient Instructions (Addendum)
Medication Instructions:  Your physician recommends that you continue on your current medications as directed. Please refer to the Current Medication list given to you today.  Labwork: None ordered.  Testing/Procedures: None ordered.  Follow-Up: Your physician wants you to follow-up in: 02/11/21 at 10:30 am with [name], MD   Any Other Special Instructions Will Be Listed Below (If Applicable).  If you need a refill on your cardiac medications before your next appointment, please call your pharmacy.

## 2020-11-07 NOTE — Progress Notes (Signed)
PCP: Henrine Screws, MD   Primary EP: Dennis King is a 70 y.o. male who presents today for routine electrophysiology followup.  Since last being seen in our clinic, the patient reports doing reasonably well.  Continues to have afib.  + palpitations and fatigue.  Today, he denies symptoms of  chest pain, shortness of breath,  lower extremity edema, dizziness, presyncope, or syncope.  The patient is otherwise without complaint today.   Past Medical History:  Diagnosis Date   Arthritis    Coronary artery disease    past post circumflex obtuse marginal branch cutting balloon atherectomy by Dennis. [name]   Family history of heart disease    Hyperlipidemia    Hypertension    Obesity    Obstructive sleep apnea    on C Pap   Paroxysmal A-fib (HCC)    Type 2 diabetes mellitus (HCC)    Past Surgical History:  Procedure Laterality Date   APPENDECTOMY     ATRIAL FIBRILLATION ABLATION N/A 05/06/2020   Procedure: ATRIAL FIBRILLATION ABLATION;  Surgeon: [name], MD;  Location: [facility] INVASIVE CV LAB;  Service: Cardiovascular;  Laterality: N/A;   CARDIAC CATHETERIZATION N/A 10/22/2014   Procedure: Left Heart Cath and Coronary Angiography;  Surgeon: Corky Crafts, MD;  Location: [facility] INVASIVE CV LAB;  Service: Cardiovascular;  Laterality: N/A;   CARDIAC CATHETERIZATION Right 10/22/2014   Procedure: Coronary Stent Intervention;  Surgeon: Corky Crafts, MD;  Location: [facility] INVASIVE CV LAB;  Service: Cardiovascular;  Laterality: Right;   ELECTROPHYSIOLOGIC STUDY N/A 01/31/2015   Procedure: Atrial Fibrillation Ablation;  Surgeon: [name], MD;  Location: [facility] INVASIVE CV LAB;  Service: Cardiovascular;  Laterality: N/A;   TEE WITHOUT CARDIOVERSION N/A 01/30/2015   Procedure: TRANSESOPHAGEAL ECHOCARDIOGRAM (TEE);  Surgeon: Wendall Stade, MD;  Location: [facility] ENDOSCOPY;  Service: Cardiovascular;  Laterality: N/A;    ROS- all systems are reviewed and negatives except as per HPI  above  Current Outpatient Medications  Medication Sig Dispense Refill   carvedilol (COREG) 25 MG tablet Take 25 mg by mouth 2 (two) times daily with a meal.      cholecalciferol (VITAMIN D) 25 MCG (1000 UNIT) tablet Take 1,000 Units by mouth every evening.     diclofenac sodium (VOLTAREN) 1 % GEL Apply 1 application topically 3 (three) times daily as needed (pain relief).     ELIQUIS 5 MG TABS tablet TAKE 1 TABLET BY MOUTH TWICE A DAY 180 tablet 1   INVOKANA 300 MG TABS tablet Take 300 mg by mouth daily.  4   lisinopril-hydrochlorothiazide (PRINZIDE,ZESTORETIC) 20-25 MG per tablet Take 1 tablet by mouth daily.      metFORMIN (GLUCOPHAGE) 1000 MG tablet Take 1,000 mg by mouth 2 (two) times daily with a meal.      Multiple Vitamin (MULTIVITAMIN WITH MINERALS) TABS tablet Take 1 tablet by mouth daily.     naproxen sodium (ALEVE) 220 MG tablet Take 440 mg by mouth in the morning and at bedtime.     nitroGLYCERIN (NITROSTAT) 0.4 MG SL tablet Place 1 tablet (0.4 mg total) under the tongue every 5 (five) minutes as needed for chest pain. 25 tablet 4   NON FORMULARY Cpap 8cm H2O     NOVOLOG MIX 70/30 (70-30) 100 UNIT/ML injection Inject 20-50 Units into the skin in the morning and at bedtime. Sliding scale  4   REPATHA SURECLICK 140 MG/ML SOAJ INJECT 1 PEN INTO THE SKIN EVERY 14 (FOURTEEN) DAYS. 2  mL 11   No current facility-administered medications for this visit.    Physical Exam: Vitals:   11/07/20 1019  BP: 138/74  Pulse: 72  SpO2: 97%  Weight: 272 lb 12.8 oz (123.7 kg)  Height: 5\' 11"  (1.803 m)    GEN- The patient is well appearing, alert and oriented x 3 today.   Head- normocephalic, atraumatic Eyes-  Sclera clear, conjunctiva pink Ears- hearing intact Oropharynx- clear Lungs- Clear to ausculation bilaterally, normal work of breathing Heart- Regular rate and rhythm, no murmurs, rubs or gallops, PMI not laterally displaced GI- soft, NT, ND, + BS Extremities- no clubbing, cyanosis,  or edema  Wt Readings from Last 3 Encounters:  11/07/20 272 lb 12.8 oz (123.7 kg)  08/06/20 265 lb (120.2 kg)  05/06/20 263 lb (119.3 kg)    EKG tracing ordered today is personally reviewed and shows sinus  Assessment and Plan:  Paroxysmal atrial fibrilla Doing well s/p  ablation x 2 (2016, 2021) Unfortunately, he is having AF.  He has not previously tried AADs. We discussed options of multaq, tikosyn, sotalol, and amiodarone at length.  He would like to try multaq if not cost prohibitive.   Chads2vasc score is 4.  He is on eliquis  2. HTN Stable No change required today  3. CAD No ischemic symptoms  4. OSA Follows with Dennis 2022  Risks, benefits and potential toxicities for medications prescribed and/or refilled reviewed with patient today.   Return in 3 months  Dennis Gala MD, [facility] 11/07/2020 10:29 AM

## 2021-01-02 ENCOUNTER — Other Ambulatory Visit: Payer: Self-pay

## 2021-01-02 ENCOUNTER — Ambulatory Visit
Admission: RE | Admit: 2021-01-02 | Discharge: 2021-01-02 | Disposition: A | Discharge status: Home / Self Care | Payer: Medicare Other | Source: Ambulatory Visit | Attending: Family Medicine | Admitting: Family Medicine

## 2021-01-02 DIAGNOSIS — R918 Other nonspecific abnormal finding of lung field: Secondary | ICD-10-CM | POA: Diagnosis not present

## 2021-01-02 DIAGNOSIS — R911 Solitary pulmonary nodule: Secondary | ICD-10-CM | POA: Diagnosis not present

## 2021-01-02 DIAGNOSIS — I700 Atherosclerosis of aorta: Secondary | ICD-10-CM | POA: Diagnosis not present

## 2021-02-11 ENCOUNTER — Other Ambulatory Visit: Payer: Self-pay

## 2021-02-11 ENCOUNTER — Ambulatory Visit (INDEPENDENT_AMBULATORY_CARE_PROVIDER_SITE_OTHER): Payer: Medicare Other | Admitting: Internal Medicine

## 2021-02-11 ENCOUNTER — Telehealth: Payer: Self-pay | Admitting: Pharmacist

## 2021-02-11 VITALS — BP 110/74 | HR 114 | Ht 71.0 in | Wt 269.0 lb

## 2021-02-11 DIAGNOSIS — G4733 Obstructive sleep apnea (adult) (pediatric): Secondary | ICD-10-CM

## 2021-02-11 DIAGNOSIS — I10 Essential (primary) hypertension: Secondary | ICD-10-CM

## 2021-02-11 DIAGNOSIS — I2510 Atherosclerotic heart disease of native coronary artery without angina pectoris: Secondary | ICD-10-CM | POA: Diagnosis not present

## 2021-02-11 DIAGNOSIS — I480 Paroxysmal atrial fibrillation: Secondary | ICD-10-CM

## 2021-02-11 MED ORDER — LISINOPRIL 20 MG PO TABS: 20.0000 mg | ORAL_TABLET | Freq: Every day | ORAL | 3 refills | Status: DC

## 2021-02-11 NOTE — Addendum Note (Signed)
Addended by: Sampson Goon on: 02/11/2021 11:58 AM   Modules accepted: Orders

## 2021-02-11 NOTE — Progress Notes (Signed)
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 male who presents today for routine electrophysiology followup.  Since last being seen in our clinic, the patient reports doing reasonably well.  He has returned to afib.  + fatigue.  Today, he denies symptoms of palpitations, chest pain, shortness of breath,  lower extremity edema, dizziness, presyncope, or syncope.  The patient is otherwise without complaint today.   Past Medical History:  Diagnosis Date   Arthritis    Coronary artery disease    past post circumflex obtuse marginal branch cutting balloon atherectomy by Dr. [name]   Family history of heart disease    Hyperlipidemia    Hypertension    Obesity    Obstructive sleep apnea    on C Pap   Paroxysmal A-fib (HCC)    Type 2 diabetes mellitus (HCC)    Past Surgical History:  Procedure Laterality Date   APPENDECTOMY     ATRIAL FIBRILLATION ABLATION N/A 05/06/2020   Procedure: ATRIAL FIBRILLATION ABLATION;  Surgeon: [name], MD;  Location: [facility] INVASIVE CV LAB;  Service: Cardiovascular;  Laterality: N/A;   CARDIAC CATHETERIZATION N/A 10/22/2014   Procedure: Left Heart Cath and Coronary Angiography;  Surgeon: Corky Crafts, MD;  Location: [facility] INVASIVE CV LAB;  Service: Cardiovascular;  Laterality: N/A;   CARDIAC CATHETERIZATION Right 10/22/2014   Procedure: Coronary Stent Intervention;  Surgeon: Corky Crafts, MD;  Location: [facility] INVASIVE CV LAB;  Service: Cardiovascular;  Laterality: Right;   ELECTROPHYSIOLOGIC STUDY N/A 01/31/2015   Procedure: Atrial Fibrillation Ablation;  Surgeon: [name], MD;  Location: [facility] INVASIVE CV LAB;  Service: Cardiovascular;  Laterality: N/A;   TEE WITHOUT CARDIOVERSION N/A 01/30/2015   Procedure: TRANSESOPHAGEAL ECHOCARDIOGRAM (TEE);  Surgeon: Wendall Stade, MD;  Location: [facility] ENDOSCOPY;  Service: Cardiovascular;  Laterality: N/A;    ROS- all systems are reviewed and negatives except as per HPI  above  Current Outpatient Medications  Medication Sig Dispense Refill   carvedilol (COREG) 25 MG tablet Take 25 mg by mouth 2 (two) times daily with a meal.      cholecalciferol (VITAMIN D) 25 MCG (1000 UNIT) tablet Take 1,000 Units by mouth every evening.     diclofenac sodium (VOLTAREN) 1 % GEL Apply 1 application topically 3 (three) times daily as needed (pain relief).     dronedarone (MULTAQ) 400 MG tablet Take 1 tablet (400 mg total) by mouth 2 (two) times daily with a meal. 180 tablet 3   ELIQUIS 5 MG TABS tablet TAKE 1 TABLET BY MOUTH TWICE A DAY 180 tablet 1   INVOKANA 300 MG TABS tablet Take 300 mg by mouth daily.  4   lisinopril-hydrochlorothiazide (PRINZIDE,ZESTORETIC) 20-25 MG per tablet Take 1 tablet by mouth daily.      metFORMIN (GLUCOPHAGE) 1000 MG tablet Take 1,000 mg by mouth 2 (two) times daily with a meal.      Multiple Vitamin (MULTIVITAMIN WITH MINERALS) TABS tablet Take 1 tablet by mouth daily.     naproxen sodium (ALEVE) 220 MG tablet Take 440 mg by mouth in the morning and at bedtime.     nitroGLYCERIN (NITROSTAT) 0.4 MG SL tablet Place 1 tablet (0.4 mg total) under the tongue every 5 (five) minutes as needed for chest pain. 25 tablet 4   NON FORMULARY Cpap 8cm H2O     NOVOLOG MIX 70/30 (70-30) 100 UNIT/ML injection Inject 20-50 Units into the skin in the morning and  at bedtime. Sliding scale  4   REPATHA SURECLICK 140 MG/ML SOAJ INJECT 1 PEN INTO THE SKIN EVERY 14 (FOURTEEN) DAYS. 2 mL 11   No current facility-administered medications for this visit.    Physical Exam: Vitals:   02/11/21 1051  BP: 110/74  Pulse: (!) 114  SpO2: 97%  Weight: 269 lb (122 kg)  Height: 5\' 11"  (1.803 m)    GEN- The patient is well appearing, alert and oriented x 3 today.   Head- normocephalic, atraumatic Eyes-  Sclera clear, conjunctiva pink Ears- hearing intact Oropharynx- clear Lungs- Clear to ausculation bilaterally, normal work of breathing Heart- irregular rate and rhythm   GI- soft, NT, ND, + BS Extremities- no clubbing, cyanosis, or edema  Wt Readings from Last 3 Encounters:  02/11/21 269 lb (122 kg)  11/07/20 272 lb 12.8 oz (123.7 kg)  08/06/20 265 lb (120.2 kg)    EKG tracing ordered today is personally reviewed and shows afib, V rates 114 bpm  Assessment and Plan:  Paroxysmal atrial fibrillation-->now persistent S/p  ablation 2016, 2021 Now back in afib and has become persistent Chads2vasc score is 4.  He is on eliquis stop multaq We discussed options at length.  We will plan tikosyn admission over the next few weeks.  2. HTN Will need to stop hctz prior to initiation of tikosyn Switch zestoretic to lisinopril 20mg  daily  3. CAD Stable No change required today  4. OSA Follows with Dr 2022  5. Obesity Body mass index is 37.52 kg/m. Lifestyle modification is advised  Risks, benefits and potential toxicities for medications prescribed and/or refilled reviewed with patient today.   His afib is refractory to medicine and ablation.  A high level of decision making was required for this encounter.  Follow-up with AF clinic to arrange tikosyn load.  MD, [facility] 02/11/2021 11:08 AM

## 2021-02-11 NOTE — Telephone Encounter (Signed)
Medication list reviewed in anticipation of upcoming Tikosyn initiation. Patient is not taking any contraindicated medications. He does take metformin which can increase concentrations of Tikosyn. Ok to continue, will need to keep a close eye on QTc. At visit today, pt was advised to stop Multaq and HCTZ on October 10. Tikosyn admission should be no sooner than October 13 to allow for washout of both meds.  Patient is anticoagulated on Eliquis 5mg  BID on the appropriate dose. Please ensure that patient has not missed any anticoagulation doses in the 3 weeks prior to Tikosyn initiation.   Patient will need to be counseled to avoid use of Benadryl while on Tikosyn and in the 2-3 days prior to Tikosyn initiation.

## 2021-02-11 NOTE — Patient Instructions (Addendum)
Medication Instructions:  On October 03/02/21 Stop Dronedarone Stop Lisinopril Hydrochlorothiazide Start Lisinopril 20 mg daily   Your physician recommends that you continue on your current medications as directed. Please refer to the Current Medication list given to you today.  Labwork: None ordered.  Testing/Procedures: None ordered.  Follow-Up: Your physician wants you to follow-up in: The Afib Clinic will contact you for Tikosyn admission.    Any Other Special Instructions Will Be Listed Below (If Applicable).  If you need a refill on your cardiac medications before your next appointment, please call your pharmacy.   Tikosyn (Dofetilide) Hospital Admission  Prior to day of admission: Check with drug insurance company for cost of drug to ensure affordability --- Dofetilide 500 mcg twice a day.  GoodRx is an option if insurance copay is unaffordable.  All patients are tested for COVID-19 prior to admission.  No Benadryl is allowed 3 days prior to admission.  Please ensure no missed doses of your anticoagulation (blood thinner) for 3 weeks prior to admission. If a dose is missed please notify our office immediately.  A pharmacist will review all your medications for potential interactions with Tikosyn. If any medication changes are needed prior to admission we will be in touch with you.  If any new medications are started AFTER your admission date is set with Radio producer. Please notify our office immediately so your medication list can be updated and reviewed by our pharmacist again. On day of admission: Tikosyn initiation requires a 3 night/4 day hospital stay with constant telemetry monitoring. You will have an EKG after each dose of Tikosyn as well as daily lab draws.  If the drug does not convert you to normal rhythm a cardioversion after the 4th dose of Tikosyn.  Afib Clinic office visit on the morning of admission is needed for preliminary labs/ekg.  Time of admission  is dependent on bed availability in the hospital. In some instances, you will be sent home until bed is available. Rarely admission can be delayed to the following day if hospital census prevents available beds.  You may bring personal belongings/clothing with you to the hospital. Please leave your suitcase in the car until you arrive in admissions.  Questions please call our office at [phone]

## 2021-02-17 DIAGNOSIS — E113293 Type 2 diabetes mellitus with mild nonproliferative diabetic retinopathy without macular edema, bilateral: Secondary | ICD-10-CM | POA: Diagnosis not present

## 2021-02-17 DIAGNOSIS — Z794 Long term (current) use of insulin: Secondary | ICD-10-CM | POA: Diagnosis not present

## 2021-02-17 DIAGNOSIS — H43393 Other vitreous opacities, bilateral: Secondary | ICD-10-CM | POA: Diagnosis not present

## 2021-02-17 DIAGNOSIS — H2513 Age-related nuclear cataract, bilateral: Secondary | ICD-10-CM | POA: Diagnosis not present

## 2021-02-18 NOTE — Telephone Encounter (Signed)
Pt has stopped Multaq and HCTZ as of today.

## 2021-03-16 ENCOUNTER — Encounter (HOSPITAL_COMMUNITY): Payer: Self-pay

## 2021-03-16 ENCOUNTER — Other Ambulatory Visit (HOSPITAL_COMMUNITY): Payer: Self-pay | Admitting: *Deleted

## 2021-03-16 MED ORDER — POTASSIUM CHLORIDE CRYS ER 20 MEQ PO TBCR: EXTENDED_RELEASE_TABLET | ORAL | 1 refills | Status: DC

## 2021-03-16 MED ORDER — FUROSEMIDE 20 MG PO TABS
ORAL_TABLET | ORAL | 1 refills | Status: DC
Start: 2021-03-16 — End: 2021-03-23

## 2021-03-17 ENCOUNTER — Inpatient Hospital Stay (HOSPITAL_COMMUNITY)
Admission: RE | Admit: 2021-03-17 | Discharge: 2021-03-20 | DRG: 308 | Disposition: A | Discharge status: Home / Self Care | Payer: Medicare Other | Source: Ambulatory Visit | Attending: Internal Medicine | Admitting: Internal Medicine

## 2021-03-17 ENCOUNTER — Other Ambulatory Visit: Payer: Self-pay

## 2021-03-17 ENCOUNTER — Ambulatory Visit (HOSPITAL_COMMUNITY)
Admission: RE | Admit: 2021-03-17 | Discharge: 2021-03-17 | Disposition: A | Discharge status: Home / Self Care | Payer: Medicare Other | Source: Ambulatory Visit | Attending: Nurse Practitioner | Admitting: Nurse Practitioner

## 2021-03-17 ENCOUNTER — Telehealth (HOSPITAL_COMMUNITY): Payer: Self-pay | Admitting: *Deleted

## 2021-03-17 ENCOUNTER — Other Ambulatory Visit (HOSPITAL_COMMUNITY): Payer: Self-pay

## 2021-03-17 ENCOUNTER — Encounter (HOSPITAL_COMMUNITY): Payer: Self-pay | Admitting: Internal Medicine

## 2021-03-17 VITALS — BP 146/88 | HR 135 | Ht 71.0 in | Wt 267.8 lb

## 2021-03-17 DIAGNOSIS — I2510 Atherosclerotic heart disease of native coronary artery without angina pectoris: Secondary | ICD-10-CM | POA: Diagnosis present

## 2021-03-17 DIAGNOSIS — E119 Type 2 diabetes mellitus without complications: Secondary | ICD-10-CM | POA: Diagnosis not present

## 2021-03-17 DIAGNOSIS — E876 Hypokalemia: Secondary | ICD-10-CM | POA: Diagnosis not present

## 2021-03-17 DIAGNOSIS — Z7984 Long term (current) use of oral hypoglycemic drugs: Secondary | ICD-10-CM

## 2021-03-17 DIAGNOSIS — Z8249 Family history of ischemic heart disease and other diseases of the circulatory system: Secondary | ICD-10-CM

## 2021-03-17 DIAGNOSIS — Z7901 Long term (current) use of anticoagulants: Secondary | ICD-10-CM | POA: Diagnosis not present

## 2021-03-17 DIAGNOSIS — I5033 Acute on chronic diastolic (congestive) heart failure: Secondary | ICD-10-CM | POA: Diagnosis present

## 2021-03-17 DIAGNOSIS — I4891 Unspecified atrial fibrillation: Secondary | ICD-10-CM

## 2021-03-17 DIAGNOSIS — G4733 Obstructive sleep apnea (adult) (pediatric): Secondary | ICD-10-CM | POA: Diagnosis present

## 2021-03-17 DIAGNOSIS — E785 Hyperlipidemia, unspecified: Secondary | ICD-10-CM | POA: Diagnosis present

## 2021-03-17 DIAGNOSIS — D6869 Other thrombophilia: Secondary | ICD-10-CM

## 2021-03-17 DIAGNOSIS — Z20822 Contact with and (suspected) exposure to covid-19: Secondary | ICD-10-CM | POA: Diagnosis not present

## 2021-03-17 DIAGNOSIS — I110 Hypertensive heart disease with heart failure: Secondary | ICD-10-CM | POA: Diagnosis present

## 2021-03-17 DIAGNOSIS — I4819 Other persistent atrial fibrillation: Principal | ICD-10-CM | POA: Diagnosis present

## 2021-03-17 DIAGNOSIS — Z6838 Body mass index (BMI) 38.0-38.9, adult: Secondary | ICD-10-CM | POA: Diagnosis not present

## 2021-03-17 DIAGNOSIS — E669 Obesity, unspecified: Secondary | ICD-10-CM | POA: Diagnosis not present

## 2021-03-17 DIAGNOSIS — Z91048 Other nonmedicinal substance allergy status: Secondary | ICD-10-CM

## 2021-03-17 DIAGNOSIS — I480 Paroxysmal atrial fibrillation: Secondary | ICD-10-CM

## 2021-03-17 DIAGNOSIS — Z79899 Other long term (current) drug therapy: Secondary | ICD-10-CM | POA: Diagnosis not present

## 2021-03-17 DIAGNOSIS — I429 Cardiomyopathy, unspecified: Secondary | ICD-10-CM | POA: Diagnosis not present

## 2021-03-17 DIAGNOSIS — M1990 Unspecified osteoarthritis, unspecified site: Secondary | ICD-10-CM | POA: Diagnosis present

## 2021-03-17 DIAGNOSIS — Z888 Allergy status to other drugs, medicaments and biological substances status: Secondary | ICD-10-CM | POA: Diagnosis not present

## 2021-03-17 LAB — BASIC METABOLIC PANEL
Anion gap: 8 (ref 5–15)
BUN: 20 mg/dL (ref 8–23)
CO2: 26 mmol/L (ref 22–32)
Calcium: 9.4 mg/dL (ref 8.9–10.3)
Chloride: 104 mmol/L (ref 98–111)
Creatinine, Ser: 1.22 mg/dL (ref 0.61–1.24)
GFR, Estimated: >60 mL/min (ref >60)
Glucose, Bld: 137 mg/dL — ABNORMAL HIGH (ref 70–99)
Potassium: 4.3 mmol/L (ref 3.5–5.1)
Sodium: 138 mmol/L (ref 135–145)

## 2021-03-17 LAB — GLUCOSE, CAPILLARY
Glucose-Capillary: 86 mg/dL (ref 70–99)
Glucose-Capillary: 94 mg/dL (ref 70–99)

## 2021-03-17 LAB — MAGNESIUM: Magnesium: 2.2 mg/dL (ref 1.7–2.4)

## 2021-03-17 MED ORDER — CANAGLIFLOZIN 300 MG PO TABS
300.0000 mg | ORAL_TABLET | Freq: Every day | ORAL | Status: DC
  Administered 2021-03-18 – 2021-03-20 (×3): 300 mg via ORAL
  Filled 2021-03-17 (×3): qty 1

## 2021-03-17 MED ORDER — DOFETILIDE 500 MCG PO CAPS
500.0000 ug | ORAL_CAPSULE | Freq: Two times a day (BID) | ORAL | Status: DC
  Administered 2021-03-17 – 2021-03-20 (×6): 500 ug via ORAL
  Filled 2021-03-17 (×6): qty 1

## 2021-03-17 MED ORDER — NAPROXEN SODIUM 220 MG PO TABS: 440.0000 mg | ORAL_TABLET | Freq: Two times a day (BID) | ORAL | Status: DC

## 2021-03-17 MED ORDER — METFORMIN HCL 500 MG PO TABS
1000.0000 mg | ORAL_TABLET | Freq: Two times a day (BID) | ORAL | Status: DC
  Administered 2021-03-17 – 2021-03-20 (×6): 1000 mg via ORAL
  Filled 2021-03-17 (×7): qty 2

## 2021-03-17 MED ORDER — POTASSIUM CHLORIDE CRYS ER 20 MEQ PO TBCR: 20.0000 meq | EXTENDED_RELEASE_TABLET | ORAL | Status: DC | PRN

## 2021-03-17 MED ORDER — APIXABAN 5 MG PO TABS
5.0000 mg | ORAL_TABLET | Freq: Two times a day (BID) | ORAL | Status: DC
  Administered 2021-03-17 – 2021-03-20 (×6): 5 mg via ORAL
  Filled 2021-03-17 (×6): qty 1

## 2021-03-17 MED ORDER — LISINOPRIL 20 MG PO TABS
20.0000 mg | ORAL_TABLET | Freq: Every day | ORAL | Status: DC
  Administered 2021-03-18 – 2021-03-20 (×3): 20 mg via ORAL
  Filled 2021-03-17 (×3): qty 1

## 2021-03-17 MED ORDER — SODIUM CHLORIDE 0.9% FLUSH: 3.0000 mL | INTRAVENOUS | Status: DC | PRN

## 2021-03-17 MED ORDER — CARVEDILOL 25 MG PO TABS
25.0000 mg | ORAL_TABLET | Freq: Two times a day (BID) | ORAL | Status: DC
  Administered 2021-03-17 – 2021-03-20 (×6): 25 mg via ORAL
  Filled 2021-03-17 (×6): qty 1

## 2021-03-17 MED ORDER — SODIUM CHLORIDE 0.9 % IV SOLN: 250.0000 mL | INTRAVENOUS | Status: DC | PRN

## 2021-03-17 MED ORDER — NITROGLYCERIN 0.4 MG SL SUBL
0.4000 mg | SUBLINGUAL_TABLET | SUBLINGUAL | Status: DC | PRN
  Filled 2021-03-17: qty 1

## 2021-03-17 MED ORDER — INSULIN ASPART PROT & ASPART (70-30 MIX) 100 UNIT/ML ~~LOC~~ SUSP
20.0000 [IU] | Freq: Two times a day (BID) | SUBCUTANEOUS | Status: DC
  Filled 2021-03-17: qty 10

## 2021-03-17 MED ORDER — SODIUM CHLORIDE 0.9% FLUSH
3.0000 mL | Freq: Two times a day (BID) | INTRAVENOUS | Status: DC
  Administered 2021-03-17 – 2021-03-20 (×7): 3 mL via INTRAVENOUS

## 2021-03-17 MED ORDER — FUROSEMIDE 20 MG PO TABS
20.0000 mg | ORAL_TABLET | ORAL | Status: DC | PRN
  Administered 2021-03-18: 20 mg via ORAL
  Filled 2021-03-17: qty 1

## 2021-03-17 NOTE — Progress Notes (Signed)
Pharmacy: Dofetilide (Tikosyn) - Initial Consult Assessment and Electrolyte Replacement  Pharmacy consulted to assist in monitoring and replacing electrolytes in this 70 y.o. male admitted on (Not on file) undergoing dofetilide initiation. First dofetilide dose: 03/17/21.  Assessment:  Patient Exclusion Criteria: If any screening criteria checked as "Yes", then  patient  should NOT receive dofetilide until criteria item is corrected.  If "Yes" please indicate correction plan.  YES  NO Patient  Exclusion Criteria Correction Plan   []   [x]   Baseline QTc interval is greater than or equal to 440 msec. IF above YES box checked dofetilide contraindicated unless patient has ICD; then may proceed if QTc 500-550 msec or with known ventricular conduction abnormalities may proceed with QTc 550-600 msec. QTc = 462    []   [x]   Patient is known or suspected to have a digoxin level greater than 2 ng/ml: No results found for: DIGOXIN     []   [x]   Creatinine clearance less than 20 ml/min (calculated using Cockcroft-Gault, actual body weight and serum creatinine): Estimated Creatinine Clearance: 75.8 mL/min (by C-G formula based on SCr of 1.22 mg/dL).     []   [x]  Patient has received drugs known to prolong the QT intervals within the last 48 hours (phenothiazines, tricyclics or tetracyclic antidepressants, erythromycin, H-1 antihistamines, cisapride, fluoroquinolones, azithromycin, ondansetron).   Updated information on QT prolonging agents is available to be searched on the following database:QT prolonging agents     []   [x]   Patient received a dose of hydrochlorothiazide (Oretic) alone or in any combination including triamterene (Dyazide, Maxzide) in the last 48 hours.    []   [x]  Patient received a medication known to increase dofetilide plasma concentrations prior to initial dofetilide dose:  Trimethoprim (Primsol, Proloprim) in the last 36 hours Verapamil (Calan, Verelan) in the last 36  hours or a sustained release dose in the last 72 hours Megestrol (Megace) in the last 5 days  Cimetidine (Tagamet) in the last 6 hours Ketoconazole (Nizoral) in the last 24 hours Itraconazole (Sporanox) in the last 48 hours  Prochlorperazine (Compazine) in the last 36 hours     []   [x]   Patient is known to have a history of torsades de pointes; congenital or acquired long QT syndromes.    []   [x]   Patient has received a Class 1 antiarrhythmic with less than 2 half-lives since last dose. (Disopyramide, Quinidine, Procainamide, Lidocaine, Mexiletine, Flecainide, Propafenone)    []   [x]   Patient has received amiodarone therapy in the past 3 months or amiodarone level is greater than 0.3 ng/ml.    Patient has been appropriately anticoagulated with apixaban.  Labs:    Component Value Date/Time   K 4.3 03/17/2021 1151   MG 2.2 03/17/2021 1151     Plan: Potassium: K >/= 4: Appropriate to initiate Tikosyn, no replacement needed    Magnesium: Mg >2: Appropriate to initiate Tikosyn, no replacement needed     Thank you for allowing pharmacy to participate in this patient's care   PharmD., BCPS Clinical Pharmacist 03/17/2021 1:44 PM

## 2021-03-17 NOTE — H&P (Signed)
Primary Care Physician: Henrine Screws, MD Referring Physician: Dr. [name] is a 70 y.o. male with a h/o persistent afib, HTN, OSA on CPAP, T2DM,  s/p ablation cardiomyopathy, CAD, that is in the afib clinic for admission for Tikosyn per Dr. [name]. He stopped Multaq and HCTZ on 9/28. He called the office yesterday as he was having some shortness of breath and fluid retention. He was given lasix/K+ yesterday and again this am and this helped with his symptoms.  He has not missed any anticoagulation for at least 3 weeks  and no benadryl use. He has cut back but continues with daily alcohol and caffeine use.    Today, he denies symptoms of palpitations, chest pain, shortness of breath, orthopnea, PND, lower extremity edema, dizziness, presyncope, syncope, or neurologic sequela. The patient is tolerating medications without difficulties and is otherwise without complaint today.        Past Medical History:  Diagnosis Date   Arthritis     Coronary artery disease      past post circumflex obtuse marginal branch cutting balloon atherectomy by Dr. [name]   Family history of heart disease     Hyperlipidemia     Hypertension     Obesity     Obstructive sleep apnea      on C Pap   Paroxysmal A-fib (HCC)     Type 2 diabetes mellitus (HCC)           Past Surgical History:  Procedure Laterality Date   APPENDECTOMY       ATRIAL FIBRILLATION ABLATION N/A 05/06/2020    Procedure: ATRIAL FIBRILLATION ABLATION;  Surgeon: [name], MD;  Location: [facility] INVASIVE CV LAB;  Service: Cardiovascular;  Laterality: N/A;   CARDIAC CATHETERIZATION N/A 10/22/2014    Procedure: Left Heart Cath and Coronary Angiography;  Surgeon: Corky Crafts, MD;  Location: [facility] INVASIVE CV LAB;  Service: Cardiovascular;  Laterality: N/A;   CARDIAC CATHETERIZATION Right 10/22/2014    Procedure: Coronary Stent Intervention;  Surgeon: Corky Crafts, MD;  Location: [facility] INVASIVE CV LAB;  Service:  Cardiovascular;  Laterality: Right;   ELECTROPHYSIOLOGIC STUDY N/A 01/31/2015    Procedure: Atrial Fibrillation Ablation;  Surgeon: [name], MD;  Location: [facility] INVASIVE CV LAB;  Service: Cardiovascular;  Laterality: N/A;   TEE WITHOUT CARDIOVERSION N/A 01/30/2015    Procedure: TRANSESOPHAGEAL ECHOCARDIOGRAM (TEE);  Surgeon: Wendall Stade, MD;  Location: [facility] ENDOSCOPY;  Service: Cardiovascular;  Laterality: N/A;            Current Outpatient Medications  Medication Sig Dispense Refill   carvedilol (COREG) 25 MG tablet Take 25 mg by mouth 2 (two) times daily with a meal.        cholecalciferol (VITAMIN D) 25 MCG (1000 UNIT) tablet Take 1,000 Units by mouth every evening.       diclofenac sodium (VOLTAREN) 1 % GEL Apply 1 application topically 3 (three) times daily as needed (pain relief).       ELIQUIS 5 MG TABS tablet TAKE 1 TABLET BY MOUTH TWICE A DAY 180 tablet 1   furosemide (LASIX) 20 MG tablet Take 1 tablet by mouth daily for swelling/weight gain 15 tablet 1   INVOKANA 300 MG TABS tablet Take 300 mg by mouth daily.   4   lisinopril (ZESTRIL) 20 MG tablet Take 1 tablet (20 mg total) by mouth daily. 90 tablet 3   metFORMIN (GLUCOPHAGE) 1000 MG tablet Take 1,000 mg by mouth  2 (two) times daily with a meal.        Multiple Vitamin (MULTIVITAMIN WITH MINERALS) TABS tablet Take 1 tablet by mouth daily.       naproxen sodium (ALEVE) 220 MG tablet Take 440 mg by mouth in the morning and at bedtime.       nitroGLYCERIN (NITROSTAT) 0.4 MG SL tablet Place 1 tablet (0.4 mg total) under the tongue every 5 (five) minutes as needed for chest pain. 25 tablet 4   NON FORMULARY Cpap 8cm H2O       NOVOLOG MIX 70/30 (70-30) 100 UNIT/ML injection Inject 20-50 Units into the skin in the morning and at bedtime. Sliding scale   4   potassium chloride SA (KLOR-CON) 20 MEQ tablet Take 1 tablet by mouth as needed when lasix is taken 15 tablet 1   REPATHA SURECLICK 140 MG/ML SOAJ INJECT 1 PEN INTO THE SKIN EVERY 14  (FOURTEEN) DAYS. 2 mL 11    No current facility-administered medications for this encounter.           Allergies  Allergen Reactions   Statins Other (See Comments)      Specifically Atorvastatin and Crestor - myalgia   Livalo [Pitavastatin] Other (See Comments)      Myalgia     Tape Rash and Other (See Comments)      Paper tape please.      Social History         Socioeconomic History   Marital status: Married      Spouse name: Not on file   Number of children: 2   Years of education: Not on file   Highest education level: Not on file  Occupational History   Not on file  Tobacco Use   Smoking status: Never   Smokeless tobacco: Never  Vaping Use   Vaping Use: Never used  Substance and Sexual Activity   Alcohol use: Yes      Alcohol/week: 14.0 - 30.0 standard drinks      Types: 14 - 30 Glasses of wine per week      Comment: previously 6 glasses of wine per night, currently 2 glasses per night   Drug use: No   Sexual activity: Not on file  Other Topics Concern   Not on file  Social History Narrative    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  Lives in [place].    Social Determinants of Health    Financial Resource Strain: Not on file  Food Insecurity: Not on file  Transportation Needs: Not on file  Physical Activity: Not on file  Stress: Not on file  Social Connections: Not on file  Intimate Partner Violence: Not on file           Family History  Problem Relation Age of Onset   Heart attack Father 87        Died age 60   Atrial fibrillation Mother        ROS- All systems are reviewed and negative except as per the HPI above   Physical Exam: There were no vitals filed for this visit.    Wt Readings from Last 3 Encounters:  02/11/21 122 kg  11/07/20 123.7 kg  08/06/20 120.2 kg      Labs: Recent Labs       Lab Results  Component Value Date    NA 136 04/07/2020    K 4.8 04/07/2020    CL 98 04/07/2020    CO2  26 04/07/2020     GLUCOSE 149 (H) 04/07/2020    BUN 24 04/07/2020    CREATININE 1.28 (H) 04/07/2020    CALCIUM 9.6 04/07/2020    MG 2.2 10/20/2014      Recent Labs       Lab Results  Component Value Date    INR 1.12 10/22/2014      Recent Labs       Lab Results  Component Value Date    CHOL 199 07/05/2017    HDL 53 07/05/2017    LDLCALC 121 (H) 07/05/2017    TRIG 123 07/05/2017          GEN- The patient is well appearing, alert and oriented x 3 today.   Head- normocephalic, atraumatic Eyes-  Sclera clear, conjunctiva pink Ears- hearing intact Oropharynx- clear Neck- supple, no JVP Lymph- no cervical lymphadenopathy Lungs- Clear to ausculation bilaterally, normal work of breathing Heart- Regular rate and rhythm, no murmurs, rubs or gallops, PMI not laterally displaced GI- soft, NT, ND, + BS Extremities- no clubbing, cyanosis, or edema MS- no significant deformity or atrophy Skin- no rash or lesion Psych- euthymic mood, full affect Neuro- strength and sensation are intact   EKG-afib at 135 bpm, qrs int 84, qtc 462 ms        Assessment and Plan:  1. Persistent afib  Being admitted for Tikosyn load today,no benadryl use Covid negative, verbally read out from outside  lab,  as not in system yet  Has been off Multaq and HCTZ for weeks  Probably why he developed some fluid retention  2 doses of lasix/K+helped shortness of breath and edema  Bmet/mag today  Qtc acceptable    2. CHA2DS2VASc score of 3 States no missed doses of eliquis 5 mg bid for at least 3 weeks    3. HTN Stable    To 6E 13 when bed available    Lupita Leash C. [facility] [address] [place], [place] [address] [phone]    ----------------------------------------------------------  I have seen, examined the patient, and reviewed the above assessment and plan.    Plan for dofetilide loading. QTc acceptable.  Lanier Prude, MD 03/17/2021 3:15 PM

## 2021-03-17 NOTE — Progress Notes (Signed)
Primary Care Physician: Henrine Screws, MD Referring Physician: Dr. [name] is a 70 y.o. male with a h/o persistent afib, HTN, OSA on CPAP, T2DM,  s/p ablation cardiomyopathy, CAD, that is in the afib clinic for admission for Tikosyn per Dr. [name]. He stopped Multaq and HCTZ on 9/28. He called the office yesterday as he was having some shortness of breath and fluid retention. He was given lasix/K+ yesterday and again this am and this helped with his symptoms.  He has not missed any anticoagulation for at least 3 weeks  and no benadryl use. He has cut back but continues with daily alcohol and caffeine use.   Today, he denies symptoms of palpitations, chest pain, shortness of breath, orthopnea, PND, lower extremity edema, dizziness, presyncope, syncope, or neurologic sequela. The patient is tolerating medications without difficulties and is otherwise without complaint today.   Past Medical History:  Diagnosis Date   Arthritis    Coronary artery disease    past post circumflex obtuse marginal branch cutting balloon atherectomy by Dr. [name]   Family history of heart disease    Hyperlipidemia    Hypertension    Obesity    Obstructive sleep apnea    on C Pap   Paroxysmal A-fib (HCC)    Type 2 diabetes mellitus (HCC)    Past Surgical History:  Procedure Laterality Date   APPENDECTOMY     ATRIAL FIBRILLATION ABLATION N/A 05/06/2020   Procedure: ATRIAL FIBRILLATION ABLATION;  Surgeon: [name], MD;  Location: [facility] INVASIVE CV LAB;  Service: Cardiovascular;  Laterality: N/A;   CARDIAC CATHETERIZATION N/A 10/22/2014   Procedure: Left Heart Cath and Coronary Angiography;  Surgeon: Corky Crafts, MD;  Location: [facility] INVASIVE CV LAB;  Service: Cardiovascular;  Laterality: N/A;   CARDIAC CATHETERIZATION Right 10/22/2014   Procedure: Coronary Stent Intervention;  Surgeon: Corky Crafts, MD;  Location: [facility] INVASIVE CV LAB;  Service: Cardiovascular;  Laterality:  Right;   ELECTROPHYSIOLOGIC STUDY N/A 01/31/2015   Procedure: Atrial Fibrillation Ablation;  Surgeon: [name], MD;  Location: [facility] INVASIVE CV LAB;  Service: Cardiovascular;  Laterality: N/A;   TEE WITHOUT CARDIOVERSION N/A 01/30/2015   Procedure: TRANSESOPHAGEAL ECHOCARDIOGRAM (TEE);  Surgeon: Wendall Stade, MD;  Location: [facility] ENDOSCOPY;  Service: Cardiovascular;  Laterality: N/A;    Current Outpatient Medications  Medication Sig Dispense Refill   carvedilol (COREG) 25 MG tablet Take 25 mg by mouth 2 (two) times daily with a meal.      cholecalciferol (VITAMIN D) 25 MCG (1000 UNIT) tablet Take 1,000 Units by mouth every evening.     diclofenac sodium (VOLTAREN) 1 % GEL Apply 1 application topically 3 (three) times daily as needed (pain relief).     ELIQUIS 5 MG TABS tablet TAKE 1 TABLET BY MOUTH TWICE A DAY 180 tablet 1   furosemide (LASIX) 20 MG tablet Take 1 tablet by mouth daily for swelling/weight gain 15 tablet 1   INVOKANA 300 MG TABS tablet Take 300 mg by mouth daily.  4   lisinopril (ZESTRIL) 20 MG tablet Take 1 tablet (20 mg total) by mouth daily. 90 tablet 3   metFORMIN (GLUCOPHAGE) 1000 MG tablet Take 1,000 mg by mouth 2 (two) times daily with a meal.      Multiple Vitamin (MULTIVITAMIN WITH MINERALS) TABS tablet Take 1 tablet by mouth daily.     naproxen sodium (ALEVE) 220 MG tablet Take 440 mg by mouth in the morning and at bedtime.  nitroGLYCERIN (NITROSTAT) 0.4 MG SL tablet Place 1 tablet (0.4 mg total) under the tongue every 5 (five) minutes as needed for chest pain. 25 tablet 4   NON FORMULARY Cpap 8cm H2O     NOVOLOG MIX 70/30 (70-30) 100 UNIT/ML injection Inject 20-50 Units into the skin in the morning and at bedtime. Sliding scale  4   potassium chloride SA (KLOR-CON) 20 MEQ tablet Take 1 tablet by mouth as needed when lasix is taken 15 tablet 1   REPATHA SURECLICK 140 MG/ML SOAJ INJECT 1 PEN INTO THE SKIN EVERY 14 (FOURTEEN) DAYS. 2 mL 11   No current  facility-administered medications for this encounter.    Allergies  Allergen Reactions   Statins Other (See Comments)    Specifically Atorvastatin and Crestor - myalgia   Livalo [Pitavastatin] Other (See Comments)    Myalgia    Tape Rash and Other (See Comments)    Paper tape please.    Social History   Socioeconomic History   Marital status: Married    Spouse name: Not on file   Number of children: 2   Years of education: Not on file   Highest education level: Not on file  Occupational History   Not on file  Tobacco Use   Smoking status: Never   Smokeless tobacco: Never  Vaping Use   Vaping Use: Never used  Substance and Sexual Activity   Alcohol use: Yes    Alcohol/week: 14.0 - 30.0 standard drinks    Types: 14 - 30 Glasses of wine per week    Comment: previously 6 glasses of wine per night, currently 2 glasses per night   Drug use: No   Sexual activity: Not on file  Other Topics Concern   Not on file  Social History Narrative   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   Social Determinants of Health   Financial Resource Strain: Not on file  Food Insecurity: Not on file  Transportation Needs: Not on file  Physical Activity: Not on file  Stress: Not on file  Social Connections: Not on file  Intimate Partner Violence: Not on file    Family History  Problem Relation Age of Onset   Heart attack Father 62       Died age 68   Atrial fibrillation Mother     ROS- All systems are reviewed and negative except as per the HPI above  Physical Exam: There were no vitals filed for this visit. Wt Readings from Last 3 Encounters:  02/11/21 122 kg  11/07/20 123.7 kg  08/06/20 120.2 kg    Labs: Lab Results  Component Value Date   NA 136 04/07/2020   K 4.8 04/07/2020   CL 98 04/07/2020   CO2 26 04/07/2020   GLUCOSE 149 (H) 04/07/2020   BUN 24 04/07/2020   CREATININE 1.28 (H) 04/07/2020   CALCIUM 9.6 04/07/2020   MG 2.2  10/20/2014   Lab Results  Component Value Date   INR 1.12 10/22/2014   Lab Results  Component Value Date   CHOL 199 07/05/2017   HDL 53 07/05/2017   LDLCALC 121 (H) 07/05/2017   TRIG 123 07/05/2017     GEN- The patient is well appearing, alert and oriented x 3 today.   Head- normocephalic, atraumatic Eyes-  Sclera clear, conjunctiva pink Ears- hearing intact Oropharynx- clear Neck- supple, no JVP Lymph- no cervical lymphadenopathy Lungs- Clear to ausculation bilaterally, normal work of breathing Heart-  Regular rate and rhythm, no murmurs, rubs or gallops, PMI not laterally displaced GI- soft, NT, ND, + BS Extremities- no clubbing, cyanosis, or edema MS- no significant deformity or atrophy Skin- no rash or lesion Psych- euthymic mood, full affect Neuro- strength and sensation are intact  EKG-afib at 135 bpm, qrs int 84, qtc 462 ms     Assessment and Plan:  1. Persistent afib  Being admitted for Tikosyn load today,no benadryl use Covid negative, verbally read out from outside  lab,  as not in system yet  Has been off Multaq and HCTZ for weeks  Probably why he developed some fluid retention  2 doses of lasix/K+helped shortness of breath and edema  Bmet/mag today  Qtc acceptable   2. CHA2DS2VASc score of 3 States no missed doses of eliquis 5 mg bid for at least 3 weeks   3. HTN Stable   To 6E 13 when bed available   Lupita Leash C. [facility] [address] [place], [place] [address] [phone]

## 2021-03-17 NOTE — TOC Benefit Eligibility Note (Signed)
Patient Product/process development scientist completed.    The patient is currently admitted and upon discharge could be taking dofetilide (Tikosyn) 500 mcg.  The current 30 day co-pay is, $21.96.   The patient is insured through [employer] Part D     Dennis King, CPhT Pharmacy Patient Advocate Specialist [facility] Patient Advocate Team Direct Number: [phone]  Fax: [phone]

## 2021-03-17 NOTE — Progress Notes (Signed)
   03/17/21 1429  Assess: MEWS Score  Temp 98.7 F (37.1 C)  BP 124/89  Pulse Rate (!) 144  ECG Heart Rate (!) 144  Resp 20  Level of Consciousness Alert  SpO2 95 %  O2 Device Room Air  Assess: MEWS Score  MEWS Temp 0  MEWS Systolic 0  MEWS Pulse 3  MEWS RR 0  MEWS LOC 0  MEWS Score 3  MEWS Score Color Yellow  Assess: if the MEWS score is Yellow or Red  Were vital signs taken at a resting state? Yes  Focused Assessment No change from prior assessment  Early Detection of Sepsis Score *See Row Information* Medium  MEWS guidelines implemented *See Row Information* Yes  Treat  Pain Scale 0-10  Pain Score 0  Patients Stated Pain Goal 0  Take Vital Signs  Increase Vital Sign Frequency  Yellow: Q 2hr X 2 then Q 4hr X 2, if remains yellow, continue Q 4hrs  Escalate  MEWS: Escalate Yellow: discuss with charge nurse/RN and consider discussing with provider and RRT  Notify: Charge Nurse/RN  Name of Charge Nurse/RN Notified Minna Antis  Date Charge Nurse/RN Notified 03/17/21  Time Charge Nurse/RN Notified 1429  Document  Patient Outcome Other (Comment) (stable; admitted for tikosyn loading; MD aware of HR)  Progress note created (see row info) Yes

## 2021-03-18 ENCOUNTER — Encounter (HOSPITAL_COMMUNITY): Payer: Self-pay | Admitting: Anesthesiology

## 2021-03-18 DIAGNOSIS — I4819 Other persistent atrial fibrillation: Secondary | ICD-10-CM | POA: Diagnosis not present

## 2021-03-18 LAB — BASIC METABOLIC PANEL
Anion gap: 10 (ref 5–15)
BUN: 21 mg/dL (ref 8–23)
CO2: 23 mmol/L (ref 22–32)
Calcium: 8.8 mg/dL — ABNORMAL LOW (ref 8.9–10.3)
Chloride: 105 mmol/L (ref 98–111)
Creatinine, Ser: 0.99 mg/dL (ref 0.61–1.24)
GFR, Estimated: >60 mL/min (ref >60)
Glucose, Bld: 77 mg/dL (ref 70–99)
Potassium: 3.5 mmol/L (ref 3.5–5.1)
Sodium: 138 mmol/L (ref 135–145)

## 2021-03-18 LAB — MAGNESIUM: Magnesium: 2.1 mg/dL (ref 1.7–2.4)

## 2021-03-18 LAB — GLUCOSE, CAPILLARY
Glucose-Capillary: 113 mg/dL — ABNORMAL HIGH (ref 70–99)
Glucose-Capillary: 173 mg/dL — ABNORMAL HIGH (ref 70–99)
Glucose-Capillary: 140 mg/dL — ABNORMAL HIGH (ref 70–99)
Glucose-Capillary: 168 mg/dL — ABNORMAL HIGH (ref 70–99)
Glucose-Capillary: 119 mg/dL — ABNORMAL HIGH (ref 70–99)

## 2021-03-18 LAB — POTASSIUM: Potassium: 4.3 mmol/L (ref 3.5–5.1)

## 2021-03-18 LAB — HIV ANTIBODY (ROUTINE TESTING W REFLEX): HIV Screen 4th Generation wRfx: NONREACTIVE

## 2021-03-18 MED ORDER — INSULIN ASPART PROT & ASPART (70-30 MIX) 100 UNIT/ML ~~LOC~~ SUSP
10.0000 [IU] | Freq: Two times a day (BID) | SUBCUTANEOUS | Status: DC
Start: 2021-03-18 — End: 2021-03-20
  Administered 2021-03-18: 35 [IU] via SUBCUTANEOUS
  Administered 2021-03-18: 40 [IU] via SUBCUTANEOUS
  Administered 2021-03-19: 15 [IU] via SUBCUTANEOUS
  Administered 2021-03-19: 45 [IU] via SUBCUTANEOUS

## 2021-03-18 MED ORDER — INSULIN ASPART PROT & ASPART (70-30 MIX) 100 UNIT/ML ~~LOC~~ SUSP: 10.0000 [IU] | Freq: Two times a day (BID) | SUBCUTANEOUS | Status: DC

## 2021-03-18 MED ORDER — POTASSIUM CHLORIDE CRYS ER 20 MEQ PO TBCR
60.0000 meq | EXTENDED_RELEASE_TABLET | Freq: Once | ORAL | Status: AC
  Administered 2021-03-18: 60 meq via ORAL
  Filled 2021-03-18: qty 3

## 2021-03-18 MED ORDER — POTASSIUM CHLORIDE CRYS ER 20 MEQ PO TBCR: 40.0000 meq | EXTENDED_RELEASE_TABLET | Freq: Once | ORAL | Status: DC

## 2021-03-18 MED ORDER — DILTIAZEM HCL 60 MG PO TABS
30.0000 mg | ORAL_TABLET | Freq: Four times a day (QID) | ORAL | Status: DC
  Administered 2021-03-18 – 2021-03-19 (×4): 30 mg via ORAL
  Filled 2021-03-18 (×4): qty 1

## 2021-03-18 MED ORDER — SPIRONOLACTONE 12.5 MG HALF TABLET
12.5000 mg | ORAL_TABLET | Freq: Every day | ORAL | Status: DC
  Administered 2021-03-18 – 2021-03-20 (×3): 12.5 mg via ORAL
  Filled 2021-03-18 (×4): qty 1

## 2021-03-18 NOTE — Discharge Instructions (Signed)

## 2021-03-18 NOTE — Progress Notes (Signed)
Pharmacy: Dofetilide (Tikosyn) - Follow Up Assessment and Electrolyte Replacement  Pharmacy consulted to assist in monitoring and replacing electrolytes in this 70 y.o. male admitted on 03/17/2021 undergoing dofetilide initiation. First dofetilide dose: 03/17/21.  Labs:    Component Value Date/Time   K 3.5 03/18/2021 0308   MG 2.1 03/18/2021 0308     Plan: Potassium: K 3.5-3.7:  Give KCl 60 mEq po x1  Will give additional potassium this afternoon if lasix is ordered. Added spironolactone for fluid and potassium retention.  Magnesium: Mg > 2: No additional supplementation needed  Benefits check shows copay of ~$22. Patient notes that he is in catastrophic coverage. We discussed goodrx and [facility] for future fills if needed.    Thank you for allowing pharmacy to participate in this patient's care   Sheppard Coil PharmD., BCPS Clinical Pharmacist 03/18/2021 7:47 AM

## 2021-03-18 NOTE — Plan of Care (Signed)
  Problem: Health Behavior/Discharge Planning: Goal: Ability to manage health-related needs will improve Outcome: Progressing   

## 2021-03-18 NOTE — Care Management (Signed)
03-18-21 1459 Patient presented for Tikosyn Load. Case Manager discussed cost of $21.96 with the patient and he wants the first Rx filled via [facility]. Patient would like refills sent for 90 day supply to [facility] [address] [place]. No further needs identified at this time.

## 2021-03-18 NOTE — Progress Notes (Addendum)
Progress Note  Patient Name: Dennis King Date of Encounter: 03/18/2021  [facility] Cardiologist: Dennis Batty, MD   Subjective   A little bloated, less then yesterday, not as SOB, no CP  Inpatient Medications    Scheduled Meds:  apixaban  5 mg Oral BID   canagliflozin  300 mg Oral Daily   carvedilol  25 mg Oral BID WC   diltiazem  30 mg Oral Q6H   dofetilide  500 mcg Oral BID   insulin aspart protamine- aspart  20-50 Units Subcutaneous BID WC   lisinopril  20 mg Oral Daily   metFORMIN  1,000 mg Oral BID WC   sodium chloride flush  3 mL Intravenous Q12H   spironolactone  12.5 mg Oral Daily   Continuous Infusions:  sodium chloride     PRN Meds: sodium chloride, furosemide, nitroGLYCERIN, potassium chloride SA, sodium chloride flush   Vital Signs    Vitals:   03/17/21 2045 03/18/21 0023 03/18/21 0538 03/18/21 0821  BP: (!) 135/98 (!) 142/101 (!) 160/117 (!) 143/76  Pulse: (!) 116 (!) 108 (!) 111 (!) 120  Resp: 17 18 20 20   Temp: 98.2 F (36.8 C) 97.9 F (36.6 C) 98.7 F (37.1 C) 98.6 F (37 C)  TempSrc: Oral Oral Oral Oral  SpO2:  92% 94%   Weight:      Height:        Intake/Output Summary (Last 24 hours) at 03/18/2021 0859 Last data filed at 03/17/2021 2015 Gross per 24 hour  Intake 480 ml  Output --  Net 480 ml   Last 3 Weights 03/17/2021 03/17/2021 02/11/2021  Weight (lbs) 269 lb 8 oz 267 lb 12.8 oz 269 lb  Weight (kg) 122.244 kg 121.473 kg 122.018 kg      Telemetry    AFib 120's-130's - Personally Reviewed  ECG    AFib 123bpm, machine reads QTc 02/13/2021, very difficult given RVR, visually looks OK - Personally Reviewed  Physical Exam   GEN: No acute distress.   Neck: No JVD Cardiac: irreg-irreg, tachycardic, no murmurs, rubs, or gallops.  Respiratory: CTA b/l. GI: Soft, nontender, non-distended  MS: trace edema; No deformity. Neuro:  Nonfocal  Psych: Normal affect   Labs    High Sensitivity Troponin:  No results for  input(s): TROPONINIHS in the last 720 hours.   Chemistry Recent Labs  Lab 03/17/21 1151 03/18/21 0308  NA 138 138  K 4.3 3.5  CL 104 105  CO2 26 23  GLUCOSE 137* 77  BUN 20 21  CREATININE 1.22 0.99  CALCIUM 9.4 8.8*  MG 2.2 2.1  GFRNONAA >60 >60  ANIONGAP 8 10    Lipids No results for input(s): CHOL, TRIG, HDL, LABVLDL, LDLCALC, CHOLHDL in the last 168 hours.  HematologyNo results for input(s): WBC, RBC, HGB, HCT, MCV, MCH, MCHC, RDW, PLT in the last 168 hours. Thyroid No results for input(s): TSH, FREET4 in the last 168 hours.  BNPNo results for input(s): BNP, PROBNP in the last 168 hours.  DDimer No results for input(s): DDIMER in the last 168 hours.   Radiology    No results found.  Cardiac Studies   04/07/20: TTE IMPRESSIONS   1. Left ventricular ejection fraction, by estimation, is 50 to 55%. The  left ventricle has low normal function. The left ventricle has no regional  wall motion abnormalities. Left ventricular diastolic function could not  be evaluated.   2. Right ventricular systolic function was not well visualized. The right  ventricular size is normal. Tricuspid regurgitation signal is inadequate  for assessing PA pressure.   3. The mitral valve is normal in structure. Trivial mitral valve  regurgitation. No evidence of mitral stenosis.   4. The aortic valve is calcified. There is mild calcification of the  aortic valve. There is moderate thickening of the aortic valve. Aortic  valve regurgitation is not visualized. Mild to moderate aortic valve  sclerosis/calcification is present, without  any evidence of aortic stenosis.   5. The inferior vena cava is dilated in size with >50% respiratory  variability, suggesting right atrial pressure of 8 mmHg.   Patient Profile     70 y.o. male w/PMHx of HTN, HLD, OSA w/CPAP, DM, CAD, AFib admitted for Tikosyn initiation  Assessment & Plan    Paroxysmal > persistent Afib CHA2DS2Vasc is 4, on Eliquis,  appropriately dosed Tikosyn initiation is in progress K+ 3.5, replacement ordered Mag 2.1 Creat 3.5 QTc difficult given RVR, d/w Dr. Johney King, continue load, same dose  DCCV tomorrow if not in SR pt aware and agreeable  Add short acting dilt for now for his RVR  HTN High here  CAD No anginal complaints  OSA Compliant with his CPAP  Acute/chronic CHF (diastolic/RVR) Progressive bloating and some SOB off his HCTZ a few weeks now PRN lasix taken yesterday at home and again this AM (has taken 3 days, with transient improvement) Adding spironolactone with mild hypokalemia Discussed IV lasix dose though he asks to hold off for now    For questions or updates, please contact [facility] Please consult [email or web address] for contact info under        Signed, Dennis Pigeon, PA-C  03/18/2021, 8:59 AM     I have seen, examined the patient, and reviewed the above assessment and plan.  Changes to above are made where necessary.  On exam, tachycardic irregular rhythm. Qt is stable.  Continue tikosyn load.  Remains in afib.  Will plan [facility] tomorrow if still in afib.  Risks of DCC discussed at length with the patient who wishes to proceed.  Co Sign: Dennis Range, MD 03/18/2021 2:50 PM

## 2021-03-19 ENCOUNTER — Encounter (HOSPITAL_COMMUNITY)
Admission: RE | Disposition: A | Discharge status: Home / Self Care | Payer: Self-pay | Source: Ambulatory Visit | Attending: Internal Medicine

## 2021-03-19 DIAGNOSIS — I4819 Other persistent atrial fibrillation: Secondary | ICD-10-CM | POA: Diagnosis not present

## 2021-03-19 DIAGNOSIS — I5033 Acute on chronic diastolic (congestive) heart failure: Secondary | ICD-10-CM

## 2021-03-19 LAB — BASIC METABOLIC PANEL
Anion gap: 9 (ref 5–15)
BUN: 21 mg/dL (ref 8–23)
CO2: 26 mmol/L (ref 22–32)
Calcium: 9.2 mg/dL (ref 8.9–10.3)
Chloride: 100 mmol/L (ref 98–111)
Creatinine, Ser: 1.09 mg/dL (ref 0.61–1.24)
GFR, Estimated: >60 mL/min (ref >60)
Glucose, Bld: 83 mg/dL (ref 70–99)
Potassium: 4.2 mmol/L (ref 3.5–5.1)
Sodium: 135 mmol/L (ref 135–145)

## 2021-03-19 LAB — GLUCOSE, CAPILLARY
Glucose-Capillary: 124 mg/dL — ABNORMAL HIGH (ref 70–99)
Glucose-Capillary: 152 mg/dL — ABNORMAL HIGH (ref 70–99)
Glucose-Capillary: 153 mg/dL — ABNORMAL HIGH (ref 70–99)
Glucose-Capillary: 186 mg/dL — ABNORMAL HIGH (ref 70–99)
Glucose-Capillary: 152 mg/dL — ABNORMAL HIGH (ref 70–99)

## 2021-03-19 LAB — MAGNESIUM: Magnesium: 2.2 mg/dL (ref 1.7–2.4)

## 2021-03-19 LAB — BRAIN NATRIURETIC PEPTIDE: B Natriuretic Peptide: 163.1 pg/mL — ABNORMAL HIGH (ref 0.0–100.0)

## 2021-03-19 SURGERY — CARDIOVERSION
Anesthesia: General

## 2021-03-19 NOTE — Progress Notes (Signed)
Pharmacy: Dofetilide (Tikosyn) - Follow Up Assessment and Electrolyte Replacement  Pharmacy consulted to assist in monitoring and replacing electrolytes in this 70 y.o. male admitted on 03/17/2021 undergoing dofetilide initiation. First dofetilide dose: 03/17/21.  Labs:    Component Value Date/Time   K 4.2 03/19/2021 0736   MG 2.2 03/19/2021 0736     Plan: Potassium: K >/= 4: No additional supplementation needed Will give additional potassium this afternoon if lasix is ordered. Added spironolactone for fluid and potassium retention.  Magnesium: Mg > 2: No additional supplementation needed  Benefits check shows copay of ~$22. Patient notes that he is in catastrophic coverage. We discussed goodrx and [facility] for future fills if needed.    Thank you for allowing pharmacy to participate in this patient's care   Sheppard Coil PharmD., BCPS Clinical Pharmacist 03/19/2021 10:34 AM

## 2021-03-19 NOTE — Discharge Summary (Addendum)
ELECTROPHYSIOLOGY PROCEDURE DISCHARGE SUMMARY    Patient ID: Dennis King,  MRN: [id number], DOB/AGE: [birth year] 70 y.o.  Admit date: 03/17/2021 Discharge date: 03/20/21  Primary Care Physician: Henrine Screws, MD Primary Cardiologist: Dr. Allyson Sabal Electrophysiologist: Dr. [name]  Primary Discharge Diagnosis:  1.  Persistent atrial fibrillation status post Tikosyn loading this admission      CHA2DS2Vasc is 4, on Eliquis  Secondary Discharge Diagnosis:  HTN CAD OSA Compliant with CPAP Chronic CHF Diastolic Afib/RVR  Allergies  Allergen Reactions   Statins Other (See Comments)    Specifically Atorvastatin and Crestor - myalgia   Livalo [Pitavastatin] Other (See Comments)    Myalgia    Tape Rash and Other (See Comments)    Paper tape please.     Procedures This Admission:  1.  Tikosyn loading   Brief HPI: Dennis King is a 70 y.o. male with a past medical history as noted above.  They were referred to EP in the outpatient setting for treatment options of atrial fibrillation.  Risks, benefits, and alternatives to Tikosyn were reviewed with the patient who wished to proceed.    Hospital Course:  The patient was admitted and Tikosyn was initiated.  Renal function and electrolytes were followed during the hospitalization.  The patient's QTc remained stable. He converted with drug and did not require DCCV.  He was monitored until discharge on telemetry which demonstrated SR.   He arrived in RVR and had some bloating he had been using lasix for, this was continued, his BNP mildly elevated.  Last evening he was woken by a monitor alarm and noted some degree of heaviness inhis chest, after several minutes seemed to radiate/travel towards his L shoulder, rated 5/10 at worse, RN offered s/l NTG though by the time she returned was easing away and resolved without it.  There was a component of reproducibility with palpation of his chest wall. EKG was without ischemic  changes, HS Trop were done 7>23, of unclear significance, though suspect chest discomfort musculoskeletal. Dr. [name] advised the patient of recurrent to call the office and work can be done He has since today, day of discharge ambulated up/down the hallway, and continues to be symptom free.  Final EKG is manually measured by myself, QT440-460 > QTc 472-464ms  On the day of discharge, He feels well, was examined by Dr [name] who considered the patient stable for discharge to home.  Follow-up has been arranged with the AFib clinic in 1 week and with EP service in 4 weeks.   At time of discharge he is no longer bloated, and appears euvolemic. Will continue low dose spironolactone and revisit diuretic need/electrolytes at his 1 week visit. He has PRN lasix at home  Tikosyn teaching was completed   Physical Exam: Vitals:   03/20/21 0125 03/20/21 0151 03/20/21 0516 03/20/21 0910  BP: (!) 149/81 138/84 (!) 142/81 139/79  Pulse:   76   Resp:   18   Temp:   98.8 F (37.1 C)   TempSrc:   Oral   SpO2:   99%   Weight:      Height:        GEN- The patient is well appearing, alert and oriented x 3 today.   HEENT: normocephalic, atraumatic; sclera clear, conjunctiva pink; hearing intact; oropharynx clear; neck supple, no JVP Lymph- no cervical lymphadenopathy Lungs- CTA b/l, normal work of breathing.  No wheezes, rales, rhonchi Heart- RRR, no murmurs, rubs or gallops, PMI not laterally  displaced GI- soft, non-tender, non-distended Extremities- no clubbing, cyanosis, or edema MS- no significant deformity or atrophy Skin- warm and dry, no rash or lesion Psych- euthymic mood, full affect Neuro- strength and sensation are intact   Labs:   Lab Results  Component Value Date   WBC 8.4 04/07/2020   HGB 14.2 04/07/2020   HCT 40.6 04/07/2020   MCV 89 04/07/2020   PLT 260 04/07/2020    Recent Labs  Lab 03/20/21 0323  NA 135  K 4.1  CL 103  CO2 26  BUN 29*  CREATININE 1.30*   CALCIUM 9.2  GLUCOSE 134*     Discharge Medications:  Allergies as of 03/20/2021       Reactions   Statins Other (See Comments)   Specifically Atorvastatin and Crestor - myalgia   Livalo [pitavastatin] Other (See Comments)   Myalgia   Tape Rash, Other (See Comments)   Paper tape please.        Medication List     STOP taking these medications    potassium chloride SA 20 MEQ tablet Commonly known as: KLOR-CON       TAKE these medications    carvedilol 25 MG tablet Commonly known as: COREG Take 25 mg by mouth 2 (two) times daily with a meal.   cholecalciferol 25 MCG (1000 UNIT) tablet Commonly known as: VITAMIN D Take 1,000 Units by mouth every evening.   diclofenac sodium 1 % Gel Commonly known as: VOLTAREN Apply 1 application topically 3 (three) times daily as needed (pain relief).   dofetilide 500 MCG capsule Commonly known as: TIKOSYN Take 1 capsule (500 mcg total) by mouth 2 (two) times daily.   Eliquis 5 MG Tabs tablet Generic drug: apixaban TAKE 1 TABLET BY MOUTH TWICE A DAY What changed: how much to take   furosemide 20 MG tablet Commonly known as: Lasix Take 1 tablet by mouth daily for swelling/weight gain What changed:  how much to take how to take this when to take this   Invokana 300 MG Tabs tablet Generic drug: canagliflozin Take 300 mg by mouth daily.   lisinopril 20 MG tablet Commonly known as: ZESTRIL Take 1 tablet (20 mg total) by mouth daily.   metFORMIN 1000 MG tablet Commonly known as: GLUCOPHAGE Take 1,000 mg by mouth 2 (two) times daily with a meal.   multivitamin with minerals Tabs tablet Take 1 tablet by mouth daily.   naproxen sodium 220 MG tablet Commonly known as: ALEVE Take 440 mg by mouth in the morning and at bedtime.   nitroGLYCERIN 0.4 MG SL tablet Commonly known as: NITROSTAT Place 1 tablet (0.4 mg total) under the tongue every 5 (five) minutes as needed for chest pain.   NON FORMULARY 1 Dose by  Other route at bedtime. Cpap 8cm H2O   NovoLOG Mix 70/30 (70-30) 100 UNIT/ML injection Generic drug: insulin aspart protamine- aspart Inject 20-50 Units into the skin in the morning and at bedtime. Sliding scale   Repatha SureClick 140 MG/ML Soaj Generic drug: Evolocumab INJECT 1 PEN INTO THE SKIN EVERY 14 (FOURTEEN) DAYS.   spironolactone 25 MG tablet Commonly known as: ALDACTONE Take 0.5 tablets (12.5 mg total) by mouth daily. Start taking on: March 21, 2021        Disposition: Home  Discharge Instructions     Diet - low sodium heart healthy   Complete by: As directed    Increase activity slowly   Complete by: As directed  Follow-up Information      [facility] Follow up.   Specialty: Cardiology Why: 03/25/21 @ 10:30AM Contact information: [address] [id number] mc [address] [place] [address] [phone]        [name], PA-C .   Specialty: Cardiology Why: 04/24/21 @ 10:30AM Contact information: [address] [place] [address] [phone]                 Duration of Discharge Encounter: Greater than 30 minutes including physician time.  Signed, Francis Dowse, PA-C 03/20/2021 12:13 PM   I have seen, examined the patient, and reviewed the above assessment and plan.  Changes to above are made where necessary.  On exam, RRR.  Qt is stable.  Will discharge to home today with close outpatient follow-up.  Co Sign: [name], MD

## 2021-03-19 NOTE — Progress Notes (Addendum)
Progress Note  Patient Name: Dennis King Date of Encounter: 03/19/2021  [facility] Cardiologist: Nanetta Batty, MD   Subjective   Feels better  Inpatient Medications    Scheduled Meds:  apixaban  5 mg Oral BID   canagliflozin  300 mg Oral Daily   carvedilol  25 mg Oral BID WC   diltiazem  30 mg Oral Q6H   dofetilide  500 mcg Oral BID   insulin aspart protamine- aspart  10-50 Units Subcutaneous BID WC   lisinopril  20 mg Oral Daily   metFORMIN  1,000 mg Oral BID WC   sodium chloride flush  3 mL Intravenous Q12H   spironolactone  12.5 mg Oral Daily   Continuous Infusions:  sodium chloride     PRN Meds: sodium chloride, nitroGLYCERIN, sodium chloride flush   Vital Signs    Vitals:   03/18/21 2033 03/19/21 0007 03/19/21 0518 03/19/21 0728  BP: 123/79 119/80 (!) 141/92 133/76  Pulse: 75  67 66  Resp: 19  18 19   Temp: 98.5 F (36.9 C)  98.5 F (36.9 C) 97.6 F (36.4 C)  TempSrc: Oral  Oral Oral  SpO2: 95%  95% 96%  Weight:      Height:        Intake/Output Summary (Last 24 hours) at 03/19/2021 0748 Last data filed at 03/18/2021 2127 Gross per 24 hour  Intake 603 ml  Output --  Net 603 ml   Last 3 Weights 03/17/2021 03/17/2021 02/11/2021  Weight (lbs) 269 lb 8 oz 267 lb 12.8 oz 269 lb  Weight (kg) 122.244 kg 121.473 kg 122.018 kg      Telemetry    SR 60's - Personally Reviewed  ECG    SR 65bpm, PACs, QTc 02/13/2021, manually measured QT , QTc - Personally Reviewed  Physical Exam   GEN: No acute distress.   Neck: No JVD Cardiac: RRR, no murmurs, rubs, or gallops.  Respiratory: CTA b/l. GI: Soft, nontender, non-distended  MS: trace edema; No deformity. Neuro:  Nonfocal  Psych: Normal affect   Labs    High Sensitivity Troponin:  No results for input(s): TROPONINIHS in the last 720 hours.   Chemistry Recent Labs  Lab 03/17/21 1151 03/18/21 0308 03/18/21 1254  NA 138 138  --   K 4.3 3.5 4.3  CL 104 105  --   CO2 26 23   --   GLUCOSE 137* 77  --   BUN 20 21  --   CREATININE 1.22 0.99  --   CALCIUM 9.4 8.8*  --   MG 2.2 2.1  --   GFRNONAA >60 >60  --   ANIONGAP 8 10  --     Lipids No results for input(s): CHOL, TRIG, HDL, LABVLDL, LDLCALC, CHOLHDL in the last 168 hours.  HematologyNo results for input(s): WBC, RBC, HGB, HCT, MCV, MCH, MCHC, RDW, PLT in the last 168 hours. Thyroid No results for input(s): TSH, FREET4 in the last 168 hours.  BNP Recent Labs  Lab 03/19/21 0314  BNP 163.1*    DDimer No results for input(s): DDIMER in the last 168 hours.   Radiology    No results found.  Cardiac Studies   04/07/20: TTE IMPRESSIONS   1. Left ventricular ejection fraction, by estimation, is 50 to 55%. The  left ventricle has low normal function. The left ventricle has no regional  wall motion abnormalities. Left ventricular diastolic function could not  be evaluated.   2. Right ventricular systolic  function was not well visualized. The right  ventricular size is normal. Tricuspid regurgitation signal is inadequate  for assessing PA pressure.   3. The mitral valve is normal in structure. Trivial mitral valve  regurgitation. No evidence of mitral stenosis.   4. The aortic valve is calcified. There is mild calcification of the  aortic valve. There is moderate thickening of the aortic valve. Aortic  valve regurgitation is not visualized. Mild to moderate aortic valve  sclerosis/calcification is present, without  any evidence of aortic stenosis.   5. The inferior vena cava is dilated in size with >50% respiratory  variability, suggesting right atrial pressure of 8 mmHg.   Patient Profile     70 y.o. male w/PMHx of HTN, HLD, OSA w/CPAP, DM, CAD, AFib admitted for Tikosyn initiation  Assessment & Plan    Paroxysmal > persistent Afib CHA2DS2Vasc is 4, on Eliquis, appropriately dosed Tikosyn initiation is in progress Labs pending  Anticipate discharge tomorrow  HTN High here  CAD No  anginal complaints  OSA Compliant with his CPAP  Acute/chronic CHF (diastolic/RVR) Progressive bloating and some SOB off his HCTZ a few weeks now Added spironolactone yesterday with mild hypokalemia Feels much more comfortable today BNP minimally elevated  Exam appears euvolemic today, no additional diuretic Await full labs    For questions or updates, please contact [facility] Please consult [email or web address] for contact info under        Signed, [name], PA-C  03/19/2021, 7:48 AM     I have seen, examined the patient, and reviewed the above assessment and plan.  Changes to above are made where necessary.  On exam, RRR.  The patient feels "much better" with sinus rhythm.  He presented with acute on chronic diastolic dysfunction class III.  BNP elevation noted.  Hopefully this will improve with sinus rhythm.  Continue tikosyn load with possible discharge tomorrow.  Co Sign: [name], MD 03/19/2021 2:56 PM

## 2021-03-20 ENCOUNTER — Other Ambulatory Visit (HOSPITAL_COMMUNITY): Payer: Self-pay

## 2021-03-20 LAB — BASIC METABOLIC PANEL
Anion gap: 6 (ref 5–15)
BUN: 29 mg/dL — ABNORMAL HIGH (ref 8–23)
CO2: 26 mmol/L (ref 22–32)
Calcium: 9.2 mg/dL (ref 8.9–10.3)
Chloride: 103 mmol/L (ref 98–111)
Creatinine, Ser: 1.3 mg/dL — ABNORMAL HIGH (ref 0.61–1.24)
GFR, Estimated: 59 mL/min — ABNORMAL LOW (ref >60)
Glucose, Bld: 134 mg/dL — ABNORMAL HIGH (ref 70–99)
Potassium: 4.1 mmol/L (ref 3.5–5.1)
Sodium: 135 mmol/L (ref 135–145)

## 2021-03-20 LAB — GLUCOSE, CAPILLARY
Glucose-Capillary: 90 mg/dL (ref 70–99)
Glucose-Capillary: 160 mg/dL — ABNORMAL HIGH (ref 70–99)

## 2021-03-20 LAB — MAGNESIUM: Magnesium: 2.2 mg/dL (ref 1.7–2.4)

## 2021-03-20 LAB — TROPONIN I (HIGH SENSITIVITY)
Troponin I (High Sensitivity): 7 ng/L (ref <18)
Troponin I (High Sensitivity): 23 ng/L — ABNORMAL HIGH (ref <18)

## 2021-03-20 MED ORDER — SPIRONOLACTONE 25 MG PO TABS: 12.5000 mg | ORAL_TABLET | Freq: Every day | ORAL | 1 refills | Status: DC

## 2021-03-20 MED ORDER — DOFETILIDE 500 MCG PO CAPS
500.0000 ug | ORAL_CAPSULE | Freq: Two times a day (BID) | ORAL | 0 refills | Status: DC
  Filled 2021-03-20 (×2): qty 60, 30d supply, fill #0

## 2021-03-20 MED ORDER — DOFETILIDE 500 MCG PO CAPS
500.0000 ug | ORAL_CAPSULE | Freq: Two times a day (BID) | ORAL | 1 refills | Status: DC
Start: 2021-03-20 — End: 2021-10-06

## 2021-03-20 MED ORDER — SPIRONOLACTONE 25 MG PO TABS
12.5000 mg | ORAL_TABLET | Freq: Every day | ORAL | 0 refills | Status: DC
  Filled 2021-03-20 (×2): qty 15, 30d supply, fill #0

## 2021-03-20 NOTE — Care Management Important Message (Signed)
Important Message  Patient Details  Name: Dennis King MRN: [id number] Date of Birth: 1950-08-25   Medicare Important Message Given:  Yes     Renie Ora 03/20/2021, 11:12 AM

## 2021-03-20 NOTE — Progress Notes (Signed)
Called to room by pt regarding 5/10 CP radiating to L shoulder. Pt also described pain as similar to CP experienced w/ his previous MI in 2016. Per pt, pain had been building for ~20-30 mins before calling RN. EKG obtained & SL nitro offered but per pt, pain resolved on it's own to ~1 or 2 out of 10. On call cardiologist paged to request troponin labs. Will continue to monitor

## 2021-03-20 NOTE — Progress Notes (Signed)
Nursing DC note  Patient alert and oriented, verbalized understanding of dc instructions. TOc meds given to patient. Belongings given to wife. Patient to be transported by volunteer via wc to the main entrance. Wife is taking patient home via car.

## 2021-03-20 NOTE — Progress Notes (Signed)
Pharmacy: Dofetilide (Tikosyn) - Follow Up Assessment and Electrolyte Replacement  Pharmacy consulted to assist in monitoring and replacing electrolytes in this 70 y.o. male admitted on 03/17/2021 undergoing dofetilide initiation. First dofetilide dose: 03/17/21.  Labs:    Component Value Date/Time   K 4.1 03/20/2021 0323   MG 2.2 03/20/2021 0323     Plan: Potassium: K >/= 4: No additional supplementation needed Will give additional potassium this afternoon if lasix is ordered. Added spironolactone for fluid and potassium retention.  Magnesium: Mg > 2: No additional supplementation needed  Benefits check shows copay of ~$22. Patient notes that he is in catastrophic coverage. We discussed [facility]rx and [facility] for future fills if needed.    At discharge would continue giving potassium supplements as he need doses of lasix, but no standing potassium orders at this time. Would consider increasing spironolactone to 25mg  daily and follow potassium levels closely post discharge.   Thank you for allowing pharmacy to participate in this patient's care   PharmD., BCPS Clinical Pharmacist 03/20/2021 8:07 AM

## 2021-03-23 ENCOUNTER — Other Ambulatory Visit (HOSPITAL_COMMUNITY): Payer: Self-pay | Admitting: Physician Assistant

## 2021-03-25 ENCOUNTER — Other Ambulatory Visit: Payer: Self-pay

## 2021-03-25 ENCOUNTER — Encounter (HOSPITAL_COMMUNITY): Payer: Self-pay | Admitting: Nurse Practitioner

## 2021-03-25 ENCOUNTER — Ambulatory Visit (HOSPITAL_COMMUNITY)
Admit: 2021-03-25 | Discharge: 2021-03-25 | Disposition: A | Discharge status: Home / Self Care | Payer: Medicare Other | Attending: Nurse Practitioner | Admitting: Nurse Practitioner

## 2021-03-25 VITALS — BP 120/70 | HR 127 | Ht 70.5 in | Wt 258.6 lb

## 2021-03-25 DIAGNOSIS — Z7901 Long term (current) use of anticoagulants: Secondary | ICD-10-CM | POA: Diagnosis not present

## 2021-03-25 DIAGNOSIS — Z9989 Dependence on other enabling machines and devices: Secondary | ICD-10-CM | POA: Insufficient documentation

## 2021-03-25 DIAGNOSIS — I4891 Unspecified atrial fibrillation: Secondary | ICD-10-CM

## 2021-03-25 DIAGNOSIS — Z79899 Other long term (current) drug therapy: Secondary | ICD-10-CM | POA: Diagnosis not present

## 2021-03-25 DIAGNOSIS — D6869 Other thrombophilia: Secondary | ICD-10-CM | POA: Diagnosis not present

## 2021-03-25 DIAGNOSIS — Z794 Long term (current) use of insulin: Secondary | ICD-10-CM | POA: Diagnosis not present

## 2021-03-25 DIAGNOSIS — Z791 Long term (current) use of non-steroidal anti-inflammatories (NSAID): Secondary | ICD-10-CM | POA: Insufficient documentation

## 2021-03-25 DIAGNOSIS — G4733 Obstructive sleep apnea (adult) (pediatric): Secondary | ICD-10-CM | POA: Insufficient documentation

## 2021-03-25 DIAGNOSIS — I4819 Other persistent atrial fibrillation: Secondary | ICD-10-CM | POA: Insufficient documentation

## 2021-03-25 DIAGNOSIS — E119 Type 2 diabetes mellitus without complications: Secondary | ICD-10-CM | POA: Diagnosis not present

## 2021-03-25 DIAGNOSIS — I110 Hypertensive heart disease with heart failure: Secondary | ICD-10-CM | POA: Diagnosis not present

## 2021-03-25 DIAGNOSIS — I509 Heart failure, unspecified: Secondary | ICD-10-CM | POA: Insufficient documentation

## 2021-03-25 DIAGNOSIS — I10 Essential (primary) hypertension: Secondary | ICD-10-CM | POA: Diagnosis not present

## 2021-03-25 DIAGNOSIS — Z7984 Long term (current) use of oral hypoglycemic drugs: Secondary | ICD-10-CM | POA: Diagnosis not present

## 2021-03-25 LAB — BASIC METABOLIC PANEL
Anion gap: 10 (ref 5–15)
BUN: 25 mg/dL — ABNORMAL HIGH (ref 8–23)
CO2: 22 mmol/L (ref 22–32)
Calcium: 9.3 mg/dL (ref 8.9–10.3)
Chloride: 102 mmol/L (ref 98–111)
Creatinine, Ser: 1.16 mg/dL (ref 0.61–1.24)
GFR, Estimated: >60 mL/min (ref >60)
Glucose, Bld: 215 mg/dL — ABNORMAL HIGH (ref 70–99)
Potassium: 4.4 mmol/L (ref 3.5–5.1)
Sodium: 134 mmol/L — ABNORMAL LOW (ref 135–145)

## 2021-03-25 LAB — MAGNESIUM: Magnesium: 2.5 mg/dL — ABNORMAL HIGH (ref 1.7–2.4)

## 2021-03-25 MED ORDER — DILTIAZEM HCL 30 MG PO TABS: ORAL_TABLET | ORAL | 1 refills | Status: DC

## 2021-03-25 NOTE — Progress Notes (Signed)
Primary Care Physician: Henrine Screws, MD Referring Physician: Dr. [name] is a 70 y.o. male with a h/o persistent afib, HTN, OSA on CPAP, T2DM,  s/p ablation cardiomyopathy, CAD, that is in the afib clinic for admission for Tikosyn per Dr. [name]. He stopped Multaq and HCTZ on 9/28. He called the office yesterday as he was having some shortness of breath and fluid retention. He was given lasix/K+ yesterday and again this am and this helped with his symptoms.  He has not missed any anticoagulation for at least 3 weeks  and no benadryl use. He has cut back but continues with daily alcohol and caffeine use.   F/u in afib clinic, 03/25/21. He is one week out from [place] load. He had been feeling so much better and was surprised that he was back in afib this am. He was placed on spironolactone in the hospital and has lost 10 lbs. Is feeling much improved from  abdominal swelling and exertional shortness of breath.   Today, he denies symptoms of palpitations, chest pain, shortness of breath, orthopnea, PND, lower extremity edema, dizziness, presyncope, syncope, or neurologic sequela. The patient is tolerating medications without difficulties and is otherwise without complaint today.   Past Medical History:  Diagnosis Date   Arthritis    Coronary artery disease    past post circumflex obtuse marginal branch cutting balloon atherectomy by Dr. [name]   Family history of heart disease    Hyperlipidemia    Hypertension    Obesity    Obstructive sleep apnea    on C Pap   Paroxysmal A-fib (HCC)    Type 2 diabetes mellitus (HCC)    Past Surgical History:  Procedure Laterality Date   APPENDECTOMY     ATRIAL FIBRILLATION ABLATION N/A 05/06/2020   Procedure: ATRIAL FIBRILLATION ABLATION;  Surgeon: [name], MD;  Location: [facility] INVASIVE CV LAB;  Service: Cardiovascular;  Laterality: N/A;   CARDIAC CATHETERIZATION N/A 10/22/2014   Procedure: Left Heart Cath and Coronary  Angiography;  Surgeon: Corky Crafts, MD;  Location: [facility] INVASIVE CV LAB;  Service: Cardiovascular;  Laterality: N/A;   CARDIAC CATHETERIZATION Right 10/22/2014   Procedure: Coronary Stent Intervention;  Surgeon: Corky Crafts, MD;  Location: [facility] INVASIVE CV LAB;  Service: Cardiovascular;  Laterality: Right;   ELECTROPHYSIOLOGIC STUDY N/A 01/31/2015   Procedure: Atrial Fibrillation Ablation;  Surgeon: [name], MD;  Location: [facility] INVASIVE CV LAB;  Service: Cardiovascular;  Laterality: N/A;   TEE WITHOUT CARDIOVERSION N/A 01/30/2015   Procedure: TRANSESOPHAGEAL ECHOCARDIOGRAM (TEE);  Surgeon: Wendall Stade, MD;  Location: [facility] ENDOSCOPY;  Service: Cardiovascular;  Laterality: N/A;    Current Outpatient Medications  Medication Sig Dispense Refill   carvedilol (COREG) 25 MG tablet Take 25 mg by mouth 2 (two) times daily with a meal.      cholecalciferol (VITAMIN D) 25 MCG (1000 UNIT) tablet Take 1,000 Units by mouth every evening.     diclofenac sodium (VOLTAREN) 1 % GEL Apply 1 application topically 3 (three) times daily as needed (pain relief).     diltiazem (CARDIZEM) 30 MG tablet Take 1 tablet every 4 hours AS NEEDED for heart rate >100 30 tablet 1   dofetilide (TIKOSYN) 500 MCG capsule Take 1 capsule (500 mcg total) by mouth 2 (two) times daily. 180 capsule 1   ELIQUIS 5 MG TABS tablet TAKE 1 TABLET BY MOUTH TWICE A DAY (Patient taking differently: Take 5 mg by mouth 2 (two) times  daily.) 180 tablet 1   furosemide (LASIX) 20 MG tablet TAKE 1 TABLET BY MOUTH DAILY FOR SWELLING/WEIGHT GAIN 15 tablet 1   INVOKANA 300 MG TABS tablet Take 300 mg by mouth daily.  4   lisinopril (ZESTRIL) 20 MG tablet Take 1 tablet (20 mg total) by mouth daily. 90 tablet 3   metFORMIN (GLUCOPHAGE) 1000 MG tablet Take 1,000 mg by mouth 2 (two) times daily with a meal.      Multiple Vitamin (MULTIVITAMIN WITH MINERALS) TABS tablet Take 1 tablet by mouth daily.     naproxen sodium (ALEVE) 220 MG tablet Take 440  mg by mouth in the morning and at bedtime.     nitroGLYCERIN (NITROSTAT) 0.4 MG SL tablet Place 1 tablet (0.4 mg total) under the tongue every 5 (five) minutes as needed for chest pain. 25 tablet 4   NON FORMULARY 1 Dose by Other route at bedtime. Cpap 8cm H2O     NOVOLOG MIX 70/30 (70-30) 100 UNIT/ML injection Inject 20-50 Units into the skin in the morning and at bedtime. Sliding scale  4   REPATHA SURECLICK 140 MG/ML SOAJ INJECT 1 PEN INTO THE SKIN EVERY 14 (FOURTEEN) DAYS. 2 mL 11   spironolactone (ALDACTONE) 25 MG tablet Take 1/2 tablets (12.5 mg total) by mouth daily. 15 tablet 0   No current facility-administered medications for this encounter.    Allergies  Allergen Reactions   Statins Other (See Comments)    Specifically Atorvastatin and Crestor - myalgia    Social History   Socioeconomic History   Marital status: Married    Spouse name: Not on file   Number of children: 2   Years of education: Not on file   Highest education level: Not on file  Occupational History   Not on file  Tobacco Use   Smoking status: Never   Smokeless tobacco: Never  Vaping Use   Vaping Use: Never used  Substance and Sexual Activity   Alcohol use: Yes    Alcohol/week: 14.0 - 30.0 standard drinks    Types: 14 - 30 Glasses of wine per week    Comment: previously 6 glasses of wine per night, currently 2 glasses per night   Drug use: No   Sexual activity: Not on file  Other Topics Concern   Not on file  Social History Narrative   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  Lives in [place].   Social Determinants of Health   Financial Resource Strain: Not on file  Food Insecurity: Not on file  Transportation Needs: Not on file  Physical Activity: Not on file  Stress: Not on file  Social Connections: Not on file  Intimate Partner Violence: Not on file    Family History  Problem Relation Age of Onset   Heart attack Father 33       Died age 36   Atrial fibrillation  Mother     ROS- All systems are reviewed and negative except as per the HPI above  Physical Exam: Vitals:   03/25/21 1044  BP: 120/70  Pulse: (!) 127  Weight: 117.3 kg  Height: 5' 10.5" (1.791 m)   Wt Readings from Last 3 Encounters:  03/25/21 117.3 kg  03/17/21 122.2 kg  03/17/21 121.5 kg    Labs: Lab Results  Component Value Date   NA 135 03/20/2021   K 4.1 03/20/2021   CL 103 03/20/2021   CO2 26 03/20/2021   GLUCOSE 134 (H) 03/20/2021  BUN 29 (H) 03/20/2021   CREATININE 1.30 (H) 03/20/2021   CALCIUM 9.2 03/20/2021   MG 2.2 03/20/2021   Lab Results  Component Value Date   INR 1.12 10/22/2014   Lab Results  Component Value Date   CHOL 199 07/05/2017   HDL 53 07/05/2017   LDLCALC 121 (H) 07/05/2017   TRIG 123 07/05/2017     GEN- The patient is well appearing, alert and oriented x 3 today.   Head- normocephalic, atraumatic Eyes-  Sclera clear, conjunctiva pink Ears- hearing intact Oropharynx- clear Neck- supple, no JVP Lymph- no cervical lymphadenopathy Lungs- Clear to ausculation bilaterally, normal work of breathing Heart- Regular rate and rhythm, no murmurs, rubs or gallops, PMI not laterally displaced GI- soft, NT, ND, + BS Extremities- no clubbing, cyanosis, or edema MS- no significant deformity or atrophy Skin- no rash or lesion Psych- euthymic mood, full affect Neuro- strength and sensation are intact  EKG-afib at 135 bpm, qrs int 84, qtc 462 ms     Assessment and Plan:  1. Persistent afib  S/p Tikosyn load one week ago Popped out of rhythm this aw Pt was unaware I feel he will spontaneously go back into rhythm later today  I will RX cardizem 30 mg to use if needed for afib with HR over 100 bpm and if systolic BP over 035  Continue Tikosyn 500 mcg bid  Continue carvedilol 25 mg bid  Bmet/mag today   2. CHA2DS2VASc score of 3 Continue eliquis 5 mg bid    3. HTN Stable   4. HF Has lost 10 lbs  Fluid symptoms much improved back  in rhythm and on spironolactone 12.5 mg daily   F/u here on Tuesday with EKG to further assess rhythm    Lupita Leash C. [facility] [address] [place], [place] [address] [phone]

## 2021-03-25 NOTE — Patient Instructions (Signed)
Cardizem 30mg -- take 1 tablet every 4 hours AS NEEDED for heart rate >100 as long as top number of blood pressure >100.  

## 2021-03-31 ENCOUNTER — Other Ambulatory Visit: Payer: Self-pay

## 2021-03-31 ENCOUNTER — Ambulatory Visit (HOSPITAL_COMMUNITY)
Admission: RE | Admit: 2021-03-31 | Discharge: 2021-03-31 | Disposition: A | Discharge status: Home / Self Care | Payer: Medicare Other | Source: Ambulatory Visit | Attending: Nurse Practitioner | Admitting: Nurse Practitioner

## 2021-03-31 ENCOUNTER — Encounter (HOSPITAL_COMMUNITY): Payer: Self-pay

## 2021-03-31 VITALS — BP 124/84 | HR 107 | Ht 70.5 in

## 2021-03-31 DIAGNOSIS — I4891 Unspecified atrial fibrillation: Secondary | ICD-10-CM | POA: Diagnosis not present

## 2021-03-31 LAB — MAGNESIUM: Magnesium: 2.5 mg/dL — ABNORMAL HIGH (ref 1.7–2.4)

## 2021-03-31 NOTE — Progress Notes (Signed)
Pt in for ekg after being found in afib at one week s/p tikosyn loading. He is in afib again today, but states that he went back into rhythm later after he saw me last week but went back into afib as he watched the [employer] football game this past weekend. He will run [employer] and send in on Friday. If persistent afib witll plan on cardioversion. No missed anticoagulation. Mag drawn today for elevation of Mag on last visit.

## 2021-04-01 ENCOUNTER — Encounter (HOSPITAL_COMMUNITY): Payer: Self-pay

## 2021-04-06 ENCOUNTER — Other Ambulatory Visit: Payer: Self-pay | Admitting: Internal Medicine

## 2021-04-06 DIAGNOSIS — E7800 Pure hypercholesterolemia, unspecified: Secondary | ICD-10-CM

## 2021-04-07 DIAGNOSIS — Z23 Encounter for immunization: Secondary | ICD-10-CM | POA: Diagnosis not present

## 2021-04-14 ENCOUNTER — Other Ambulatory Visit (HOSPITAL_COMMUNITY): Payer: Self-pay | Admitting: Nurse Practitioner

## 2021-04-23 ENCOUNTER — Encounter: Payer: Self-pay | Admitting: Internal Medicine

## 2021-04-23 NOTE — Progress Notes (Signed)
Cardiology Office Note Date:  04/24/2021  Patient ID:  Dennis, King 11-04-1950, MRN [id number] PCP:  Aura Dials, MD  Cardiologist:  Dr. Gwenlyn Found Electrophysiologist: Dr. Rayann Heman    Chief Complaint: s/p Tikosyn initiation  History of Present Illness: Dennis King is a 70 y.o. male with history of HTN, HLD, OSA w/CPAP, DM, CAD, AFib, chronic CHF (diastolic)  At his 1 week visit with the AFib clinic 03/25/21, he had gone out of rhythm that AM, suspected he would return to [place], planned for labs and a f/u vEKG Labs looked OK, f/u EKG was AFlutter 03/31/21  Kardia tracings he has sent since are reviewed, some are difficult to say with certainty though rate is slower and regular, some are more clearly SR Chart message notes feeling better then he has in months  TODAY He is doing well, happy with his rhythm control States he has maintained normal rhythm for the last 3 weeks until last night They had eaten out and had coffee after dinner (regular), once home he started to feel a little poorly like his sugar was low, it was 80 (after a meal), ate a meal bar and drank some milk, noted his heart irregular and by the time he got his [name] he was feeling better and pulse was regular again  No CP, no SOB No near syncope or syncope   Afib Hx Diagnosed 2016 PVI ablation 02/02/2015  AAD hx Tikosyn started Oct 2022   Past Medical History:  Diagnosis Date   Arthritis    Coronary artery disease    past post circumflex obtuse marginal branch cutting balloon atherectomy by Dr. Irish Lack   Family history of heart disease    Hyperlipidemia    Hypertension    Obesity    Obstructive sleep apnea    on C Pap   Paroxysmal A-fib ([place])    Type 2 diabetes mellitus ([place])     Past Surgical History:  Procedure Laterality Date   APPENDECTOMY     ATRIAL FIBRILLATION ABLATION N/A 05/06/2020   Procedure: ATRIAL FIBRILLATION ABLATION;  Surgeon: Thompson Grayer, MD;  Location: [place] [facility];  Service: Cardiovascular;  Laterality: N/A;   CARDIAC CATHETERIZATION N/A 10/22/2014   Procedure: Left Heart Cath and Coronary Angiography;  Surgeon: Jettie Booze, MD;  Location: [place] [facility];  Service: Cardiovascular;  Laterality: N/A;   CARDIAC CATHETERIZATION Right 10/22/2014   Procedure: Coronary Stent Intervention;  Surgeon: Jettie Booze, MD;  Location: [place] [facility];  Service: Cardiovascular;  Laterality: Right;   ELECTROPHYSIOLOGIC STUDY N/A 01/31/2015   Procedure: Atrial Fibrillation Ablation;  Surgeon: Thompson Grayer, MD;  Location: [place] [facility];  Service: Cardiovascular;  Laterality: N/A;   TEE WITHOUT CARDIOVERSION N/A 01/30/2015   Procedure: TRANSESOPHAGEAL ECHOCARDIOGRAM (TEE);  Surgeon: Josue Hector, MD;  Location: [facility] ENDOSCOPY;  Service: Cardiovascular;  Laterality: N/A;    Current Outpatient Medications  Medication Sig Dispense Refill   carvedilol (COREG) 25 MG tablet Take 25 mg by mouth 2 (two) times daily with a meal.      cholecalciferol (VITAMIN D) 25 MCG (1000 UNIT) tablet Take 1,000 Units by mouth every evening.     diclofenac sodium (VOLTAREN) 1 % GEL Apply 1 application topically 3 (three) times daily as needed (pain relief).     diltiazem (CARDIZEM) 30 MG tablet Take 1 tablet every 4 hours AS NEEDED for heart rate >100 30 tablet 1   dofetilide (TIKOSYN) 500 MCG capsule Take  1 capsule (500 mcg total) by mouth 2 (two) times daily. 180 capsule 1   ELIQUIS 5 MG TABS tablet TAKE 1 TABLET BY MOUTH TWICE A DAY 180 tablet 1   Evolocumab (REPATHA SURECLICK) 140 MG/ML SOAJ INJECT 1 PEN INTO THE SKIN EVERY 14 (FOURTEEN) DAYS. 6 mL 3   furosemide (LASIX) 20 MG tablet TAKE 1 TABLET BY MOUTH DAILY FOR SWELLING/WEIGHT GAIN 15 tablet 0   INVOKANA 300 MG TABS tablet Take 300 mg by mouth daily.  4   lisinopril (ZESTRIL) 20 MG tablet Take 1 tablet (20 mg total) by mouth daily. 90 tablet 3   metFORMIN (GLUCOPHAGE) 1000 MG tablet Take 1,000 mg by mouth 2 (two)  times daily with a meal.      Multiple Vitamin (MULTIVITAMIN WITH MINERALS) TABS tablet Take 1 tablet by mouth daily.     naproxen sodium (ALEVE) 220 MG tablet Take 440 mg by mouth in the morning and at bedtime.     nitroGLYCERIN (NITROSTAT) 0.4 MG SL tablet Place 1 tablet (0.4 mg total) under the tongue every 5 (five) minutes as needed for chest pain. 25 tablet 4   NON FORMULARY 1 Dose by Other route at bedtime. Cpap 8cm H2O     NOVOLOG MIX 70/30 (70-30) 100 UNIT/ML injection Inject 20-50 Units into the skin in the morning and at bedtime. Sliding scale  4   spironolactone (ALDACTONE) 25 MG tablet Take 1/2 tablets (12.5 mg total) by mouth daily. 15 tablet 0   No current facility-administered medications for this visit.    Allergies:   Statins   Social History:  The patient  reports that he has never smoked. He has never used smokeless tobacco. He reports current alcohol use of about 14.0 - 30.0 standard drinks per week. He reports that he does not use drugs.   Family History:  The patient's family history includes Atrial fibrillation in his mother; Heart attack (age of onset: 60) in his father.  ROS:  Please see the history of present illness.    All other systems are reviewed and otherwise negative.   PHYSICAL EXAM:  VS:  There were no vitals taken for this visit. BMI: There is no height or weight on file to calculate BMI. Well nourished, well developed, in no acute distress HEENT: normocephalic, atraumatic Neck: no JVD, carotid bruits or masses Cardiac:  RRR; no significant murmurs, no rubs, or gallops Lungs:  CTA b/l, no wheezing, rhonchi or rales Abd: soft, nontender MS: no deformity or atrophy Ext: no edema Skin: warm and dry, no rash Neuro:  No gross deficits appreciated Psych: euthymic mood, full affect   EKG:  Done today and reviewed by myself shows  SB 59bpm, QTc    04/07/2020: TTE IMPRESSIONS   1. Left ventricular ejection fraction, by estimation, is 50 to  55%. The  left ventricle has low normal function. The left ventricle has no regional  wall motion abnormalities. Left ventricular diastolic function could not  be evaluated.   2. Right ventricular systolic function was not well visualized. The right  ventricular size is normal. Tricuspid regurgitation signal is inadequate  for assessing PA pressure.   3. The mitral valve is normal in structure. Trivial mitral valve  regurgitation. No evidence of mitral stenosis.   4. The aortic valve is calcified. There is mild calcification of the  aortic valve. There is moderate thickening of the aortic valve. Aortic  valve regurgitation is not visualized. Mild to moderate aortic valve  sclerosis/calcification is present, without  any evidence of aortic stenosis.   5. The inferior vena cava is dilated in size with >50% respiratory  variability, suggesting right atrial pressure of 8 mmHg.     10/22/2014: LHC Ost 2nd Mrg to 2nd Mrg lesion, 95% stenosed. Scoring balloon angioplasty was performed with excellent result and no visible dissection. There is a 10% residual stenosis post intervention. Low normal LV systolic function.   Given his atrial fibrillation and need for anticoagulation, we elected to treat this ostial obtuse marginal lesion with scoring balloon angioplasty only. The patient did have anginal symptoms with balloon inflations. There is an excellent angiographic result. Tirofiban to continue for a few hours. We started clopidogrel post procedure.  This could be continued for 2 weeks, since no stent was placed. I encouraged the patient to start Eliquis for stroke prevention. He is still deciding but I think he will agree eventually to take the Eliquis. Continue management of atrial fibrillation. Consider rhythm control.     Recent Labs: 03/19/2021: B Natriuretic Peptide 163.1 03/25/2021: BUN 25; Creatinine, Ser 1.16; Potassium 4.4; Sodium 134 03/31/2021: Magnesium 2.5  No results found for  requested labs within last 8760 hours.   CrCl cannot be calculated (Patient's most recent lab result is older than the maximum 21 days allowed.).   Wt Readings from Last 3 Encounters:  03/25/21 258 lb 9.6 oz (117.3 kg)  03/17/21 269 lb 8 oz (122.2 kg)  03/17/21 267 lb 12.8 oz (121.5 kg)     Other studies reviewed: Additional studies/records reviewed today include: summarized above  ASSESSMENT AND PLAN:  Paroxysmal Afib CHA2DS2Vasc is 5, on Eliquis Tikosyn  QTc stable Labs today  2. Chronic CHF (diastolic) He wants to stop the spironolactone, maintaining normal rhythm he feels he will not require a diuretic regularly   Discussed rational for keeping it, though can stop it and follow his volume  If he stops it will need to recheck his BMET/K+ and discussed the importance of that He has a PMD visit in the next month or so, for now he will continue it, should he decided to stop it will do so just prior to the visit and have his lab done there He will let us know if/when he does that  3. CAD No anginal symptoms On BB Statins listed as an allergy C/w Dr. Zonia Kief  Disposition: F/u with [place] in 47mo, sooner if needed  Current medicines are reviewed at length with the patient today.  The patient did not have any concerns regarding medicines.  Venetia Night, PA-C 04/24/2021 10:32 AM     [facility] [address] [phone] (office)  [phone] (fax)

## 2021-04-24 ENCOUNTER — Other Ambulatory Visit: Payer: Self-pay

## 2021-04-24 ENCOUNTER — Encounter: Payer: Self-pay | Admitting: Physician Assistant

## 2021-04-24 ENCOUNTER — Ambulatory Visit (INDEPENDENT_AMBULATORY_CARE_PROVIDER_SITE_OTHER): Payer: Medicare Other | Admitting: Physician Assistant

## 2021-04-24 VITALS — BP 138/68 | HR 59 | Ht 71.0 in | Wt 263.0 lb

## 2021-04-24 DIAGNOSIS — I2510 Atherosclerotic heart disease of native coronary artery without angina pectoris: Secondary | ICD-10-CM | POA: Diagnosis not present

## 2021-04-24 DIAGNOSIS — D6869 Other thrombophilia: Secondary | ICD-10-CM

## 2021-04-24 DIAGNOSIS — I480 Paroxysmal atrial fibrillation: Secondary | ICD-10-CM | POA: Diagnosis not present

## 2021-04-24 DIAGNOSIS — Z79899 Other long term (current) drug therapy: Secondary | ICD-10-CM

## 2021-04-24 DIAGNOSIS — Z5181 Encounter for therapeutic drug level monitoring: Secondary | ICD-10-CM | POA: Diagnosis not present

## 2021-04-24 DIAGNOSIS — I4891 Unspecified atrial fibrillation: Secondary | ICD-10-CM | POA: Diagnosis not present

## 2021-04-24 LAB — BASIC METABOLIC PANEL
BUN/Creatinine Ratio: 18 (ref 10–24)
BUN: 19 mg/dL (ref 8–27)
CO2: 24 mmol/L (ref 20–29)
Calcium: 9.7 mg/dL (ref 8.6–10.2)
Chloride: 101 mmol/L (ref 96–106)
Creatinine, Ser: 1.08 mg/dL (ref 0.76–1.27)
Glucose: 104 mg/dL — ABNORMAL HIGH (ref 70–99)
Potassium: 4.4 mmol/L (ref 3.5–5.2)
Sodium: 136 mmol/L (ref 134–144)
eGFR: 74 mL/min/{1.73_m2} (ref >59)

## 2021-04-24 LAB — MAGNESIUM: Magnesium: 2.2 mg/dL (ref 1.6–2.3)

## 2021-04-24 NOTE — Telephone Encounter (Signed)
Visit today with Francis Dowse, PA

## 2021-04-24 NOTE — Patient Instructions (Signed)
Medication Instructions:   Your physician recommends that you continue on your current medications as directed. Please refer to the Current Medication list given to you today.  *If you need a refill on your cardiac medications before your next appointment, please call your pharmacy*   Lab Work: BMET AND MAG TODAY   If you have labs (blood work) drawn today and your tests are completely normal, you will receive your results only by: MyChart Message (if you have MyChart) OR A paper copy in the mail If you have any lab test that is abnormal or we need to change your treatment, we will call you to review the results.   Testing/Procedures: NONE ORDERED  TODAY    Follow-Up: At [facility], you and your health needs are our priority.  As part of our continuing mission to provide you with exceptional heart care, we have created designated Provider Care Teams.  These Care Teams include your primary Cardiologist (physician) and Advanced Practice Providers (APPs -  Physician Assistants and Nurse Practitioners) who all work together to provide you with the care you need, when you need it.  We recommend signing up for the patient portal called "MyChart".  Sign up information is provided on this After Visit Summary.  MyChart is used to connect with patients for Virtual Visits (Telemedicine).  Patients are able to view lab/test results, encounter notes, upcoming appointments, etc.  Non-urgent messages can be sent to your provider as well.   To learn more about what you can do with MyChart, go to [email or web address].    Your next appointment:   6 month(s)  The format for your next appointment:   In Person  Provider:   You will see one of the following Advanced Practice Providers on your designated Care Team:   Francis Dowse, [place]    Other Instructions

## 2021-04-27 ENCOUNTER — Other Ambulatory Visit (HOSPITAL_COMMUNITY): Payer: Self-pay | Admitting: Internal Medicine

## 2021-05-09 ENCOUNTER — Other Ambulatory Visit: Payer: Self-pay | Admitting: Cardiovascular Disease

## 2021-05-11 NOTE — Telephone Encounter (Signed)
Prescription refill request for Eliquis received. Indication:Afib Last office visit:12/22 Scr:1.0 Age: 70 Weight:119.3 kg  Prescription refilled

## 2021-05-13 DIAGNOSIS — Z23 Encounter for immunization: Secondary | ICD-10-CM | POA: Diagnosis not present

## 2021-05-13 DIAGNOSIS — R32 Unspecified urinary incontinence: Secondary | ICD-10-CM | POA: Diagnosis not present

## 2021-05-13 DIAGNOSIS — Z794 Long term (current) use of insulin: Secondary | ICD-10-CM | POA: Diagnosis not present

## 2021-05-13 DIAGNOSIS — I2510 Atherosclerotic heart disease of native coronary artery without angina pectoris: Secondary | ICD-10-CM | POA: Diagnosis not present

## 2021-05-13 DIAGNOSIS — Z0000 Encounter for general adult medical examination without abnormal findings: Secondary | ICD-10-CM | POA: Diagnosis not present

## 2021-05-13 DIAGNOSIS — E7800 Pure hypercholesterolemia, unspecified: Secondary | ICD-10-CM | POA: Diagnosis not present

## 2021-05-13 DIAGNOSIS — Z125 Encounter for screening for malignant neoplasm of prostate: Secondary | ICD-10-CM | POA: Diagnosis not present

## 2021-05-13 DIAGNOSIS — I10 Essential (primary) hypertension: Secondary | ICD-10-CM | POA: Diagnosis not present

## 2021-05-13 DIAGNOSIS — E782 Mixed hyperlipidemia: Secondary | ICD-10-CM | POA: Diagnosis not present

## 2021-05-13 DIAGNOSIS — E1142 Type 2 diabetes mellitus with diabetic polyneuropathy: Secondary | ICD-10-CM | POA: Diagnosis not present

## 2021-05-13 DIAGNOSIS — Z7901 Long term (current) use of anticoagulants: Secondary | ICD-10-CM | POA: Diagnosis not present

## 2021-05-13 DIAGNOSIS — E669 Obesity, unspecified: Secondary | ICD-10-CM | POA: Diagnosis not present

## 2021-05-13 DIAGNOSIS — Z7984 Long term (current) use of oral hypoglycemic drugs: Secondary | ICD-10-CM | POA: Diagnosis not present

## 2021-06-29 DIAGNOSIS — G4733 Obstructive sleep apnea (adult) (pediatric): Secondary | ICD-10-CM | POA: Diagnosis not present

## 2021-08-02 IMAGING — CT CT CHEST W/O CM
2 of 4 series · 15 of 36 positions shown, 18 images · non-contrast
Comparison: CT scan April 30, 2020

CLINICAL DATA: Pulmonary nodules.

EXAM:
CT CHEST WITHOUT CONTRAST
TECHNIQUE: Multidetector CT imaging of the chest was performed following the
standard protocol without IV contrast.

[Series 2: chest 2.00 br40 s3 · axial · 0.89mm/px · z∈[+1632,+1918]mm · 12 of 169 slices shown, 15 images (1 of 2)]
[im 13/169  mediastinal]
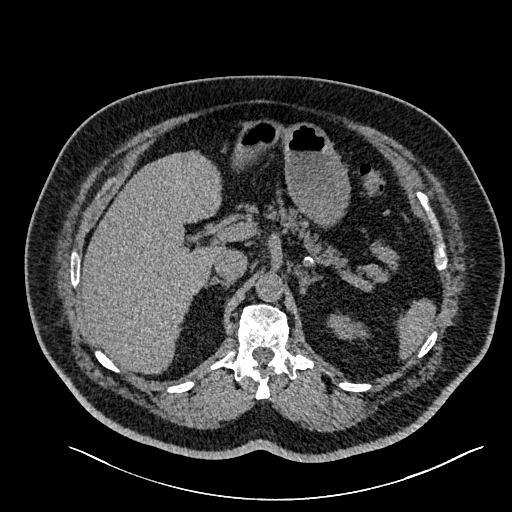
[im 13/169  lung]
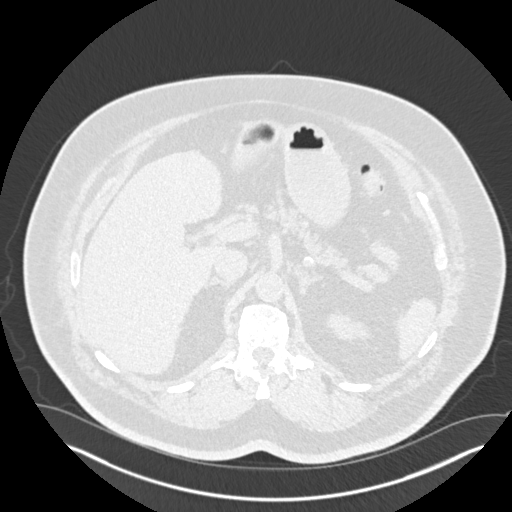
[im 26/169  lung]
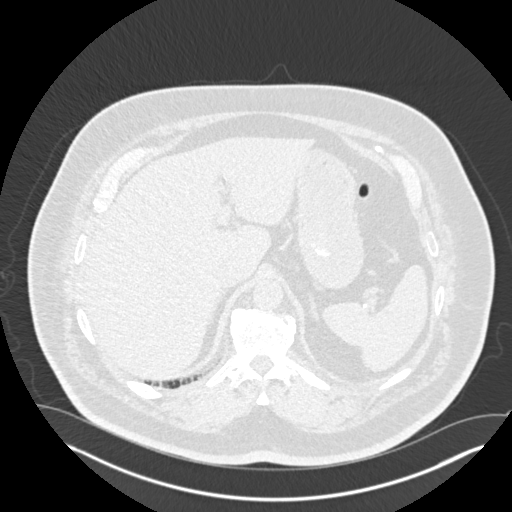
[im 39/169  lung]
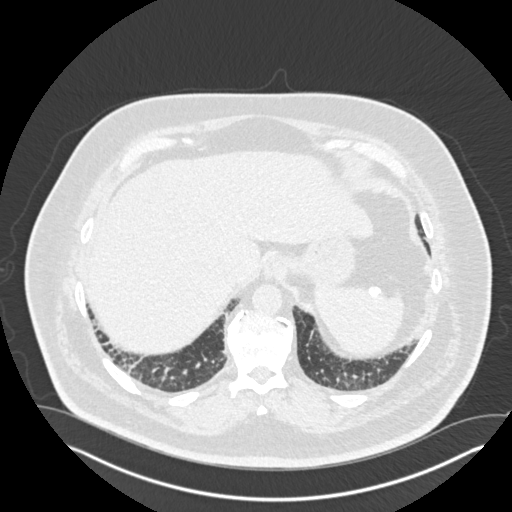
[im 52/169  lung]
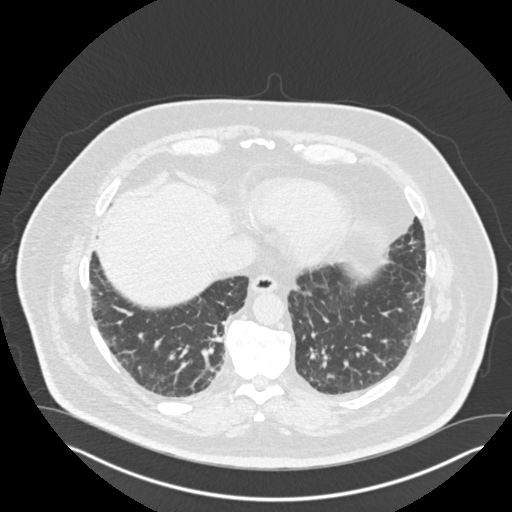
[im 65/169  mediastinal]
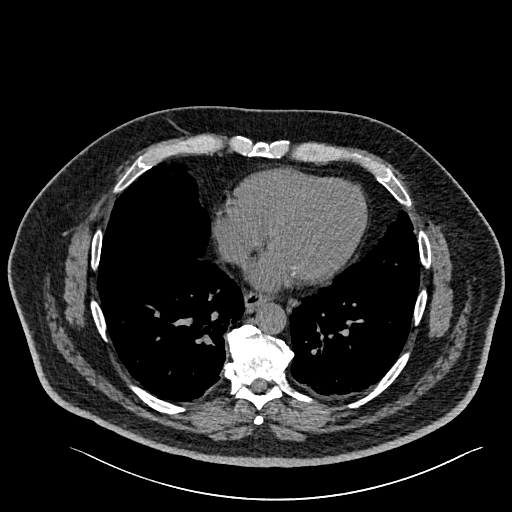
[im 65/169  lung]
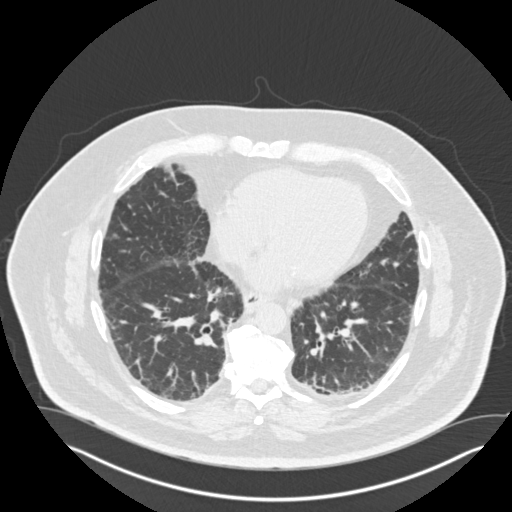
[im 78/169  lung]
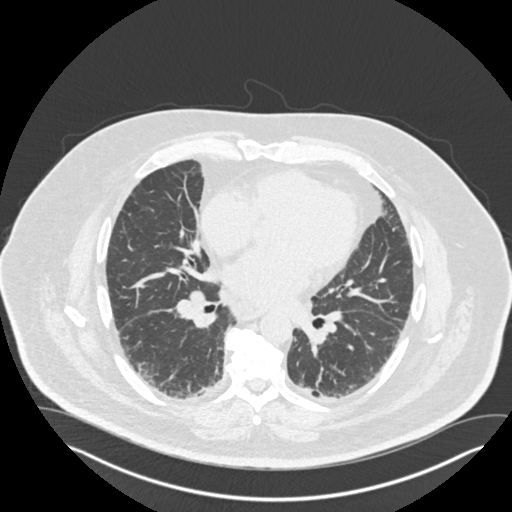
[im 91/169  lung]
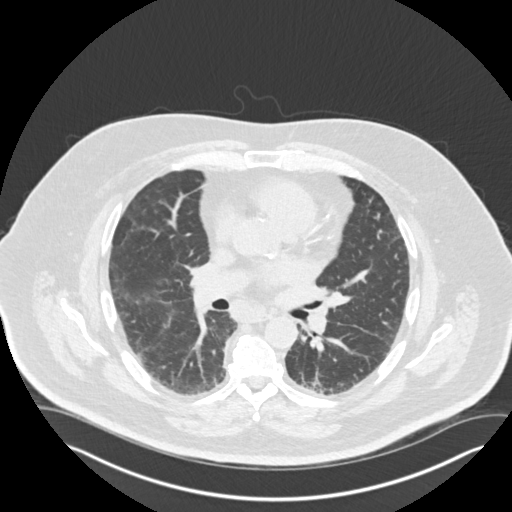
[im 104/169  lung]
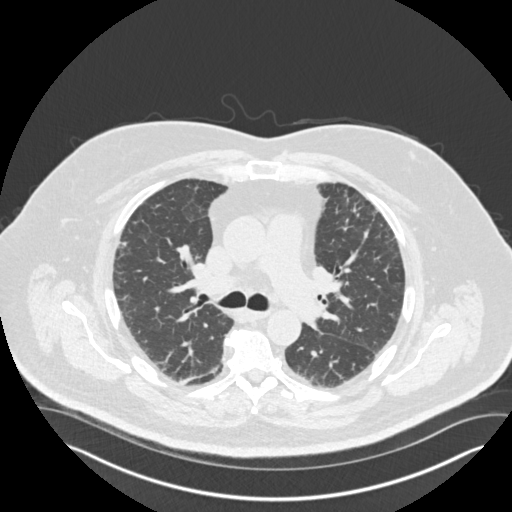
[im 117/169  mediastinal]
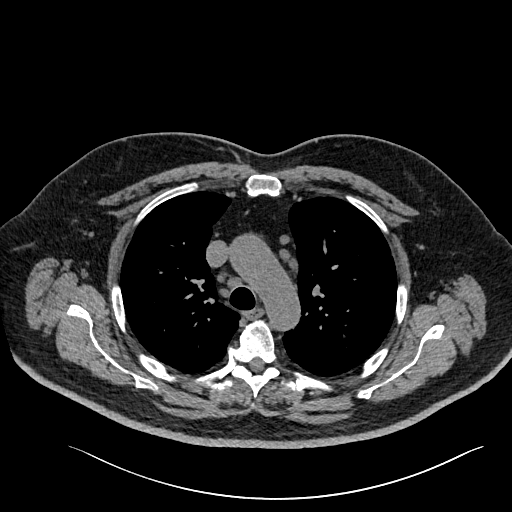
[im 117/169  lung]
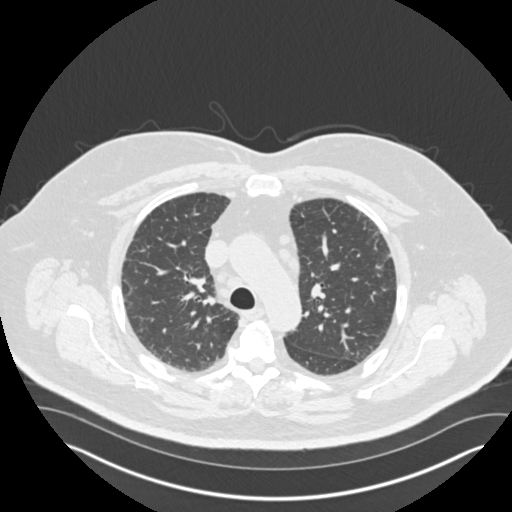
[im 130/169  lung]
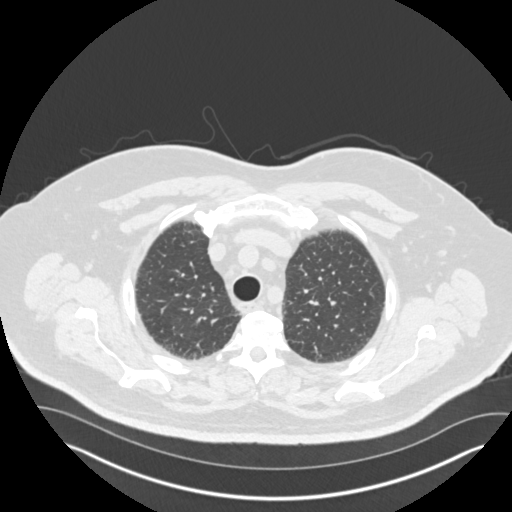
[im 143/169  lung]
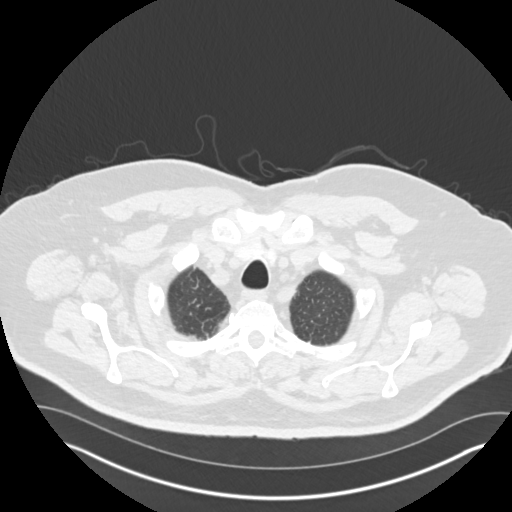
[im 156/169  lung]
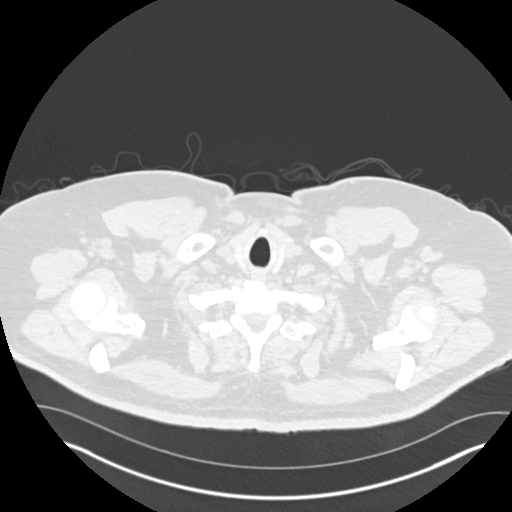

[Series 4: chest 2.00 br40 s3 · coronal · 0.66mm/px · 3 of 227 slices shown (2 of 2)]
[im 46/227  lung]
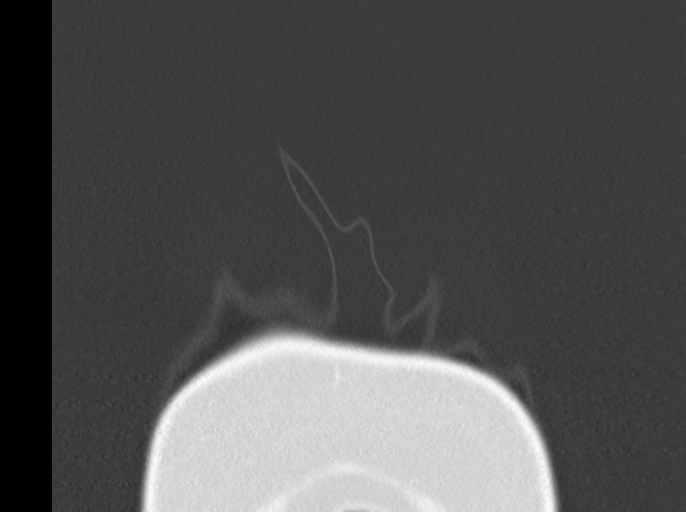
[im 91/227  lung]
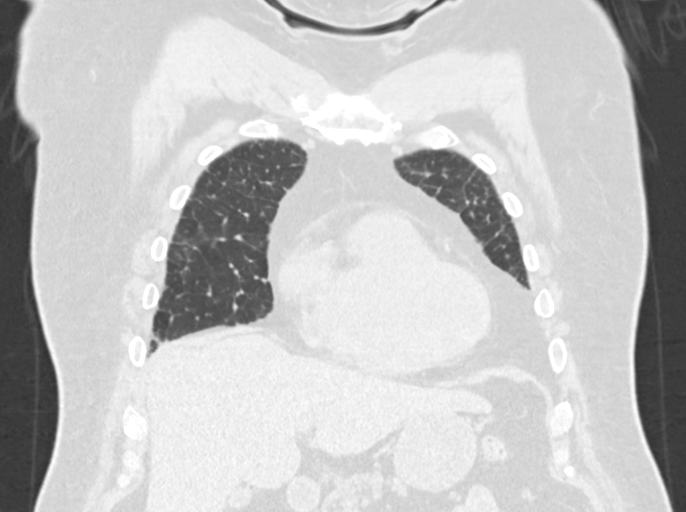
[im 136/227  lung]
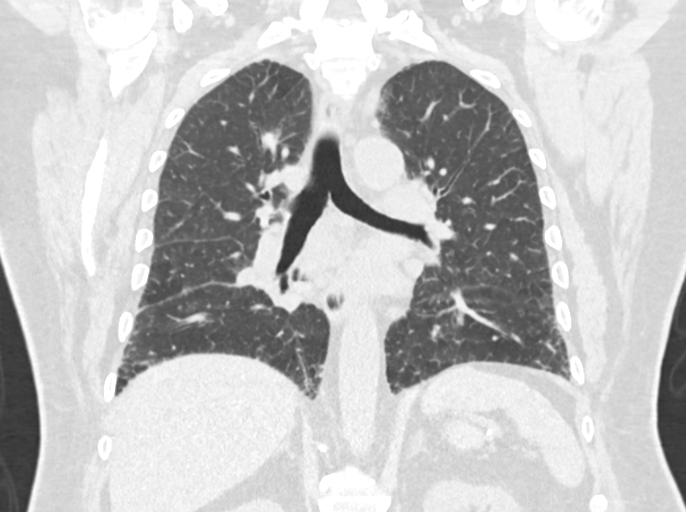

[15 of 36 positions shown; findings below may reference images not displayed]

FINDINGS: Cardiovascular: Three-vessel coronary artery disease is identified.
The heart is unchanged. Central pulmonary arteries are normal in
caliber. Atherosclerotic changes in the thoracic aorta are
relatively mild. No aneurysm.

Mediastinum/Nodes: No effusions. The esophagus and thyroid are
normal. Chest wall is normal. A prevascular node on series 2, image
unchanged. Multiple other shotty nodes are identified.

Lungs/Pleura: Central airways are normal. No pneumothorax.
Peripheral reticular opacities are identified without honeycombing.
A pulmonary nodule on series 8, image 67 measures 7 by 4 mm,
unchanged. A right infrahilar nodule is difficult to separate from
the other hilar structures but is similar in size measuring 8 x 11
mm today versus 8 x 13 mm previously. Nodularity along the left
fissure is stable. No other suspicious pulmonary nodules. No masses
or focal infiltrates.

Upper Abdomen: No acute abnormality.

Musculoskeletal: No chest wall mass or suspicious bone lesions
identified.
IMPRESSION: 1. The pulmonary nodules on series 8, images 67 and 92 are stable.
Recommend another follow-up CT scan in 6 months.
2. Reticular opacities in the lungs consistent with interstitial
lung disease.
3. Three-vessel coronary artery disease.
4. Atherosclerotic changes in the thoracic aorta.
5. Shotty and mildly enlarged lymph nodes may be reactive but are
nonspecific. Recommend attention on follow-up.

Aortic Atherosclerosis ([id number]-X4J.J).

## 2021-09-10 DIAGNOSIS — E1142 Type 2 diabetes mellitus with diabetic polyneuropathy: Secondary | ICD-10-CM | POA: Diagnosis not present

## 2021-09-10 DIAGNOSIS — I10 Essential (primary) hypertension: Secondary | ICD-10-CM | POA: Diagnosis not present

## 2021-09-10 DIAGNOSIS — I2510 Atherosclerotic heart disease of native coronary artery without angina pectoris: Secondary | ICD-10-CM | POA: Diagnosis not present

## 2021-09-10 DIAGNOSIS — G8929 Other chronic pain: Secondary | ICD-10-CM | POA: Diagnosis not present

## 2021-09-10 DIAGNOSIS — E782 Mixed hyperlipidemia: Secondary | ICD-10-CM | POA: Diagnosis not present

## 2021-09-10 DIAGNOSIS — I480 Paroxysmal atrial fibrillation: Secondary | ICD-10-CM | POA: Diagnosis not present

## 2021-09-29 DIAGNOSIS — E1142 Type 2 diabetes mellitus with diabetic polyneuropathy: Secondary | ICD-10-CM | POA: Diagnosis not present

## 2021-10-06 ENCOUNTER — Other Ambulatory Visit: Payer: Self-pay | Admitting: Physician Assistant

## 2021-10-21 DIAGNOSIS — I480 Paroxysmal atrial fibrillation: Secondary | ICD-10-CM | POA: Diagnosis not present

## 2021-10-21 DIAGNOSIS — G8929 Other chronic pain: Secondary | ICD-10-CM | POA: Diagnosis not present

## 2021-10-21 DIAGNOSIS — E782 Mixed hyperlipidemia: Secondary | ICD-10-CM | POA: Diagnosis not present

## 2021-10-21 DIAGNOSIS — I2510 Atherosclerotic heart disease of native coronary artery without angina pectoris: Secondary | ICD-10-CM | POA: Diagnosis not present

## 2021-10-21 DIAGNOSIS — E1142 Type 2 diabetes mellitus with diabetic polyneuropathy: Secondary | ICD-10-CM | POA: Diagnosis not present

## 2021-10-21 DIAGNOSIS — I10 Essential (primary) hypertension: Secondary | ICD-10-CM | POA: Diagnosis not present

## 2021-11-20 ENCOUNTER — Other Ambulatory Visit (HOSPITAL_COMMUNITY): Payer: Self-pay | Admitting: Internal Medicine

## 2021-11-26 ENCOUNTER — Other Ambulatory Visit (HOSPITAL_COMMUNITY): Payer: Self-pay | Admitting: Nurse Practitioner

## 2021-12-29 DIAGNOSIS — G8929 Other chronic pain: Secondary | ICD-10-CM | POA: Diagnosis not present

## 2021-12-29 DIAGNOSIS — E782 Mixed hyperlipidemia: Secondary | ICD-10-CM | POA: Diagnosis not present

## 2021-12-29 DIAGNOSIS — E1142 Type 2 diabetes mellitus with diabetic polyneuropathy: Secondary | ICD-10-CM | POA: Diagnosis not present

## 2021-12-29 DIAGNOSIS — I2510 Atherosclerotic heart disease of native coronary artery without angina pectoris: Secondary | ICD-10-CM | POA: Diagnosis not present

## 2021-12-29 DIAGNOSIS — E7800 Pure hypercholesterolemia, unspecified: Secondary | ICD-10-CM | POA: Diagnosis not present

## 2021-12-29 DIAGNOSIS — I10 Essential (primary) hypertension: Secondary | ICD-10-CM | POA: Diagnosis not present

## 2021-12-31 DIAGNOSIS — E1142 Type 2 diabetes mellitus with diabetic polyneuropathy: Secondary | ICD-10-CM | POA: Diagnosis not present

## 2022-01-27 ENCOUNTER — Ambulatory Visit (HOSPITAL_COMMUNITY): Payer: Medicare Other | Admitting: Nurse Practitioner

## 2022-02-09 ENCOUNTER — Ambulatory Visit (HOSPITAL_COMMUNITY)
Admission: RE | Admit: 2022-02-09 | Discharge: 2022-02-09 | Disposition: A | Discharge status: Home / Self Care | Payer: PPO | Source: Ambulatory Visit | Attending: Nurse Practitioner | Admitting: Nurse Practitioner

## 2022-02-09 ENCOUNTER — Encounter (HOSPITAL_COMMUNITY): Payer: Self-pay | Admitting: Nurse Practitioner

## 2022-02-09 VITALS — BP 158/76 | HR 75 | Ht 71.0 in | Wt 237.4 lb

## 2022-02-09 DIAGNOSIS — I10 Essential (primary) hypertension: Secondary | ICD-10-CM | POA: Insufficient documentation

## 2022-02-09 DIAGNOSIS — R609 Edema, unspecified: Secondary | ICD-10-CM | POA: Diagnosis not present

## 2022-02-09 DIAGNOSIS — I480 Paroxysmal atrial fibrillation: Secondary | ICD-10-CM | POA: Insufficient documentation

## 2022-02-09 DIAGNOSIS — G4733 Obstructive sleep apnea (adult) (pediatric): Secondary | ICD-10-CM | POA: Diagnosis not present

## 2022-02-09 DIAGNOSIS — Z79899 Other long term (current) drug therapy: Secondary | ICD-10-CM | POA: Diagnosis not present

## 2022-02-09 DIAGNOSIS — I429 Cardiomyopathy, unspecified: Secondary | ICD-10-CM | POA: Diagnosis not present

## 2022-02-09 DIAGNOSIS — Z7901 Long term (current) use of anticoagulants: Secondary | ICD-10-CM | POA: Diagnosis not present

## 2022-02-09 DIAGNOSIS — R0602 Shortness of breath: Secondary | ICD-10-CM | POA: Diagnosis not present

## 2022-02-09 DIAGNOSIS — E119 Type 2 diabetes mellitus without complications: Secondary | ICD-10-CM | POA: Diagnosis not present

## 2022-02-09 DIAGNOSIS — D6869 Other thrombophilia: Secondary | ICD-10-CM | POA: Diagnosis not present

## 2022-02-09 DIAGNOSIS — I2510 Atherosclerotic heart disease of native coronary artery without angina pectoris: Secondary | ICD-10-CM | POA: Diagnosis not present

## 2022-02-09 LAB — BASIC METABOLIC PANEL WITH GFR
Anion gap: 10 (ref 5–15)
BUN: 18 mg/dL (ref 8–23)
CO2: 26 mmol/L (ref 22–32)
Calcium: 9.5 mg/dL (ref 8.9–10.3)
Chloride: 104 mmol/L (ref 98–111)
Creatinine, Ser: 1.1 mg/dL (ref 0.61–1.24)
GFR, Estimated: >60 mL/min
Glucose, Bld: 147 mg/dL — ABNORMAL HIGH (ref 70–99)
Potassium: 4.4 mmol/L (ref 3.5–5.1)
Sodium: 140 mmol/L (ref 135–145)

## 2022-02-09 LAB — MAGNESIUM: Magnesium: 2.5 mg/dL — ABNORMAL HIGH (ref 1.7–2.4)

## 2022-02-09 NOTE — Progress Notes (Signed)
Primary Care Physician: Henrine Screws, MD Referring Physician: Dr. [name] is a 71 y.o. male with a h/o persistent afib, HTN, OSA on CPAP, T2DM,  s/p ablation cardiomyopathy, CAD, that is in the afib clinic for admission for Tikosyn per Dr. [name]. He stopped Multaq and HCTZ on 9/28. He called the office yesterday as he was having some shortness of breath and fluid retention. He was given lasix/K+ yesterday and again this am and this helped with his symptoms.  He has not missed any anticoagulation for at least 3 weeks  and no benadryl use. He has cut back but continues with daily alcohol and caffeine use.   F/u in afib clinic, 03/25/21. He is one week out from [place] load. He had been feeling so much better and was surprised that he was back in afib this am. He was placed on spironolactone in the hospital and has lost 10 lbs. Is feeling much improved from  abdominal swelling and exertional shortness of breath.   F/u in afib clinic, 02/09/22 for Tikosyn surveillance. He reports having afib about every 2 weeks lasing around 12 hours. He has had 2 ablations in the past with Dr. [name]. He is complaint with tikosyn.  We discussed today establishing with another EP in Dr. Jenel Lucks absence to see if a 3 rd ablation would be necessary and he is interested to proceed.   Today, he denies symptoms of palpitations, chest pain, shortness of breath, orthopnea, PND, lower extremity edema, dizziness, presyncope, syncope, or neurologic sequela. The patient is tolerating medications without difficulties and is otherwise without complaint today.   Past Medical History:  Diagnosis Date   Arthritis    Coronary artery disease    past post circumflex obtuse marginal branch cutting balloon atherectomy by Dr. [name]   Family history of heart disease    Hyperlipidemia    Hypertension    Obesity    Obstructive sleep apnea    on C Pap   Paroxysmal A-fib ([facility])    Type 2 diabetes mellitus ([facility])     Past Surgical History:  Procedure Laterality Date   APPENDECTOMY     ATRIAL FIBRILLATION ABLATION N/A 05/06/2020   Procedure: ATRIAL FIBRILLATION ABLATION;  Surgeon: [name], MD;  Location: [facility];  Service: Cardiovascular;  Laterality: N/A;   CARDIAC CATHETERIZATION N/A 10/22/2014   Procedure: Left Heart Cath and Coronary Angiography;  Surgeon: Corky Crafts, MD;  Location: [facility] LAB;  Service: Cardiovascular;  Laterality: N/A;   CARDIAC CATHETERIZATION Right 10/22/2014   Procedure: Coronary Stent Intervention;  Surgeon: Corky Crafts, MD;  Location: [facility] INVASIVE CV LAB;  Service: Cardiovascular;  Laterality: Right;   ELECTROPHYSIOLOGIC STUDY N/A 01/31/2015   Procedure: Atrial Fibrillation Ablation;  Surgeon: [name], MD;  Location: [facility] INVASIVE [facility] LAB;  Service: Cardiovascular;  Laterality: N/A;   TEE WITHOUT CARDIOVERSION N/A 01/30/2015   Procedure: TRANSESOPHAGEAL ECHOCARDIOGRAM (TEE);  Surgeon: Wendall Stade, MD;  Location: [facility] ENDOSCOPY;  Service: Cardiovascular;  Laterality: N/A;    Current Outpatient Medications  Medication Sig Dispense Refill   carvedilol (COREG) 25 MG tablet Take 25 mg by mouth 2 (two) times daily with a meal.      cholecalciferol (VITAMIN D) 25 MCG (1000 UNIT) tablet Take 1,000 Units by mouth every evening.     Continuous Blood Gluc Receiver (FREESTYLE LIBRE 2 READER) DEVI Use as directed to monitor blood glucose     Continuous Blood Gluc Sensor (  FREESTYLE LIBRE 2 SENSOR) MISC Use as directed every 14 days     diclofenac sodium (VOLTAREN) 1 % GEL Apply 1 application topically 3 (three) times daily as needed (pain relief).     diltiazem (CARDIZEM) 30 MG tablet TAKE ONE TABLET EVERY FOUR HOURS AS NEEDED FOR HEART RATE >100 appt needed for refill [id number] 30 tablet 0   dofetilide (TIKOSYN) 500 MCG capsule TAKE ONE CAPSULE BY MOUTH TWICE DAILY 180 capsule 2   ELIQUIS 5 MG TABS tablet TAKE 1 TABLET BY MOUTH TWICE A DAY 180  tablet 1   Evolocumab (REPATHA SURECLICK) 140 MG/ML SOAJ INJECT 1 PEN INTO THE SKIN EVERY 14 (FOURTEEN) DAYS. 6 mL 3   INVOKANA 300 MG TABS tablet Take 300 mg by mouth daily.  4   Krill Oil 1000 MG CAPS Take 3 capsules by mouth every morning.     lisinopril (ZESTRIL) 20 MG tablet TAKE ONE TABLET BY MOUTH EVERY DAY 90 tablet 1   metFORMIN (GLUCOPHAGE) 1000 MG tablet Take 1,000 mg by mouth 2 (two) times daily with a meal.      Multiple Vitamin (MULTIVITAMIN WITH MINERALS) TABS tablet Take 1 tablet by mouth daily.     naproxen sodium (ALEVE) 220 MG tablet Take 440 mg by mouth in the morning and at bedtime.     nitroGLYCERIN (NITROSTAT) 0.4 MG SL tablet Place 1 tablet (0.4 mg total) under the tongue every 5 (five) minutes as needed for chest pain. 25 tablet 4   NON FORMULARY 1 Dose by Other route at bedtime. Cpap 8cm H2O     NOVOLOG MIX 70/30 (70-30) 100 UNIT/ML injection Inject 0-15 Units into the skin in the morning and at bedtime. Sliding scale  4   OZEMPIC, 0.25 OR 0.5 MG/DOSE, 2 MG/3ML SOPN Inject 0.5 mg into the skin once a week.     sildenafil (REVATIO) 20 MG tablet Taking 2-5 tablets by mouth as needed     No current facility-administered medications for this encounter.    Allergies  Allergen Reactions   Statins Other (See Comments)    Specifically Atorvastatin and Crestor - myalgia    Social History   Socioeconomic History   Marital status: Married    Spouse name: Not on file   Number of children: 2   Years of education: Not on file   Highest education level: Not on file  Occupational History   Not on file  Tobacco Use   Smoking status: Never   Smokeless tobacco: Never  Vaping Use   Vaping Use: Never used  Substance and Sexual Activity   Alcohol use: Yes    Alcohol/week: 14.0 - 30.0 standard drinks of alcohol    Types: 14 - 30 Glasses of wine per week    Comment: previously 6 glasses of wine per night, currently 2 glasses per night   Drug use: No   Sexual activity:  Not on file  Other Topics Concern   Not on file  Social History Narrative   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  Lives in [place].   Social Determinants of Health   Financial Resource Strain: Not on file  Food Insecurity: Not on file  Transportation Needs: Not on file  Physical Activity: Not on file  Stress: Not on file  Social Connections: Not on file  Intimate Partner Violence: Not on file    Family History  Problem Relation Age of Onset   Heart attack Father 64  Died age 14   Atrial fibrillation Mother     ROS- All systems are reviewed and negative except as per the HPI above  Physical Exam: Vitals:   02/09/22 0920  BP: (!) 158/76  Pulse: 75  Weight: 107.7 kg  Height: 5\' 11"  (1.803 m)   Wt Readings from Last 3 Encounters:  02/09/22 107.7 kg  04/24/21 119.3 kg  03/25/21 117.3 kg    Labs: Lab Results  Component Value Date   NA 136 04/24/2021   K 4.4 04/24/2021   CL 101 04/24/2021   CO2 24 04/24/2021   GLUCOSE 104 (H) 04/24/2021   BUN 19 04/24/2021   CREATININE 1.08 04/24/2021   CALCIUM 9.7 04/24/2021   MG 2.2 04/24/2021   Lab Results  Component Value Date   INR 1.12 10/22/2014   Lab Results  Component Value Date   CHOL 199 07/05/2017   HDL 53 07/05/2017   LDLCALC 121 (H) 07/05/2017   TRIG 123 07/05/2017     GEN- The patient is well appearing, alert and oriented x 3 today.   Head- normocephalic, atraumatic Eyes-  Sclera clear, conjunctiva pink Ears- hearing intact Oropharynx- clear Neck- supple, no JVP Lymph- no cervical lymphadenopathy Lungs- Clear to ausculation bilaterally, normal work of breathing Heart- Regular rate and rhythm, no murmurs, rubs or gallops, PMI not laterally displaced GI- soft, NT, ND, + BS Extremities- no clubbing, cyanosis, or edema MS- no significant deformity or atrophy Skin- no rash or lesion Psych- euthymic mood, full affect Neuro- strength and sensation are intact  EKG- Vent. rate  75 BPM PR interval 156 ms QRS duration 88 ms QT/QTcB 404/451 ms P-R-T axes -48 39 0 Unusual P axis, possible ectopic atrial rhythm Abnormal ECG When compared with ECG of 31-Mar-2021 09:55, PREVIOUS ECG IS PRESENT    Assessment and Plan:  1. Persistent afib  He is in SR but having breakthrough episodes 2x a month lasting 12 hours  We discussed seeing another EP to see if a 3rd ablation is feasible  He would prefer Dr. Curt Bears  Continue Tikosyn 500 mcg bid, qtc stable   Continue carvedilol 25 mg bid  Bmet/mag today   2. CHA2DS2VASc score of 3 Continue eliquis 5 mg bid    3. HTN Stable   To EP and f/u here in 6 months for Tikosyn surveillance   Butch Penny C. Rigo Letts, [facility] [address] [place], [place] [address] [phone]

## 2022-02-16 ENCOUNTER — Encounter: Payer: Self-pay | Admitting: Cardiology

## 2022-02-16 ENCOUNTER — Encounter: Payer: Self-pay | Admitting: *Deleted

## 2022-02-16 ENCOUNTER — Ambulatory Visit: Payer: PPO | Attending: Cardiology | Admitting: Cardiology

## 2022-02-16 VITALS — BP 163/82 | HR 69 | Ht 71.0 in | Wt 239.6 lb

## 2022-02-16 DIAGNOSIS — D6869 Other thrombophilia: Secondary | ICD-10-CM | POA: Diagnosis not present

## 2022-02-16 DIAGNOSIS — I4819 Other persistent atrial fibrillation: Secondary | ICD-10-CM

## 2022-02-16 NOTE — Patient Instructions (Signed)
Medication Instructions:  Your physician recommends that you continue on your current medications as directed. Please refer to the Current Medication list given to you today.  *If you need a refill on your cardiac medications before your next appointment, please call your pharmacy*   Lab Work: Pre procedure labs -- see procedure instruction letter:  BMP & CBC  If you have labs (blood work) drawn today and your tests are completely normal, you will receive your results only by: MyChart Message (if you have MyChart) OR A paper copy in the mail If you have any lab test that is abnormal or we need to change your treatment, we will call you to review the results.   Testing/Procedures: Your physician has requested that you have cardiac CT within 7 days PRIOR to your ablation. Cardiac computed tomography (CT) is a painless test that uses an x-ray machine to take clear, detailed pictures of your heart.  Please follow instruction below located under "other instructions". You will get a call from our office to schedule the date for this test.  Your physician has recommended that you have an ablation. Catheter ablation is a medical procedure used to treat some cardiac arrhythmias (irregular heartbeats). During catheter ablation, a long, thin, flexible tube is put into a blood vessel in your groin (upper thigh), or neck. This tube is called an ablation catheter. It is then guided to your heart through the blood vessel. Radio frequency waves destroy small areas of heart tissue where abnormal heartbeats may cause an arrhythmia to start. Please follow instruction letter given to you today.   Follow-Up: At [facility] you and your health needs are our priority.  As part of our continuing mission to provide you with exceptional heart care, we have created designated Provider Care Teams.  These Care Teams include your primary Cardiologist (physician) and Advanced Practice Providers (APPs -  Physician  Assistants and Nurse Practitioners) who all work together to provide you with the care you need, when you need it.  Your physician recommends that you schedule a follow-up appointment between 06/14/21 - 06/18/21 in the AFib clinic.  (For H&P and pre procedure labs)  Your next appointment:   1 month(s) after your ablation  The format for your next appointment:   In Person  Provider:   AFib clinic   Thank you for choosing [facility]!!   Dory Horn, RN [phone]    Other Instructions   Cardiac Ablation Cardiac ablation is a procedure to destroy (ablate) some heart tissue that is sending bad signals. These bad signals cause problems in heart rhythm. The heart has many areas that make these signals. If there are problems in these areas, they can make the heart beat in a way that is not normal. Destroying some tissues can help make the heart rhythm normal. Tell your doctor about: Any allergies you have. All medicines you are taking. These include vitamins, herbs, eye drops, creams, and over-the-counter medicines. Any problems you or family members have had with medicines that make you fall asleep (anesthetics). Any blood disorders you have. Any surgeries you have had. Any medical conditions you have, such as kidney failure. Whether you are pregnant or may be pregnant. What are the risks? This is a safe procedure. But problems may occur, including: Infection. Bruising and bleeding. Bleeding into the chest. Stroke or blood clots. Damage to nearby areas of your body. Allergies to medicines or dyes. The need for a pacemaker if the normal system is damaged.  Failure of the procedure to treat the problem. What happens before the procedure? Medicines Ask your doctor about: Changing or stopping your normal medicines. This is important. Taking aspirin and ibuprofen. Do not take these medicines unless your doctor tells you to take them. Taking other medicines, vitamins,  herbs, and supplements. General instructions Follow instructions from your doctor about what you cannot eat or drink. Plan to have someone take you home from the hospital or clinic. If you will be going home right after the procedure, plan to have someone with you for 24 hours. Ask your doctor what steps will be taken to prevent infection. What happens during the procedure?  An IV tube will be put into one of your veins. You will be given a medicine to help you relax. The skin on your neck or groin will be numbed. A cut (incision) will be made in your neck or groin. A needle will be put through your cut and into a large vein. A tube (catheter) will be put into the needle. The tube will be moved to your heart. Dye may be put through the tube. This helps your doctor see your heart. Small devices (electrodes) on the tube will send out signals. A type of energy will be used to destroy some heart tissue. The tube will be taken out. Pressure will be held on your cut. This helps stop bleeding. A bandage will be put over your cut. The exact procedure may vary among doctors and hospitals. What happens after the procedure? You will be watched until you leave the hospital or clinic. This includes checking your heart rate, breathing rate, oxygen, and blood pressure. Your cut will be watched for bleeding. You will need to lie still for a few hours. Do not drive for 24 hours or as long as your doctor tells you. Summary Cardiac ablation is a procedure to destroy some heart tissue. This is done to treat heart rhythm problems. Tell your doctor about any medical conditions you may have. Tell him or her about all medicines you are taking to treat them. This is a safe procedure. But problems may occur. These include infection, bruising, bleeding, and damage to nearby areas of your body. Follow what your doctor tells you about food and drink. You may also be told to change or stop some of your  medicines. After the procedure, do not drive for 24 hours or as long as your doctor tells you. This information is not intended to replace advice given to you by your health care provider. Make sure you discuss any questions you have with your health care provider. Document Revised: 07/31/2021 Document Reviewed: 04/12/2019 Elsevier Patient Education  [place].

## 2022-02-16 NOTE — Progress Notes (Signed)
Electrophysiology Office Note   Date:  02/16/2022   ID:  Dennis King, DOB 1950-06-12, MRN [id number]  PCP:  Dennis Dials, MD  Cardiologist:   Primary Electrophysiologist:  Dennis Montini Meredith Leeds, MD    Chief Complaint: AF   History of Present Illness: Dennis King is a 71 y.o. male who is being seen today for the evaluation of AF at the request of Dennis Dials, MD. Presenting today for electrophysiology evaluation.  He has a history significant for persistent atrial fibrillation, hypertension, OSA, type 2 diabetes, coronary artery disease.  He is currently on dofetilide.  He is status post ablation x2.  Most recent ablation 03/25/2021 with reisolation of the right pulmonary veins and posterior wall isolation.  He presents today in clinic with more frequent episodes of atrial fibrillation.  His atrial fibrillation episodes occur once or twice every 2 weeks.  He has no chest pain, but does note shortness of breath, fatigue, palpitations.  He takes diltiazem, and after the second or third dose he usually converts to normal rhythm.  Today, he denies symptoms of palpitations, chest pain, shortness of breath, orthopnea, PND, lower extremity edema, claudication, dizziness, presyncope, syncope, bleeding, or neurologic sequela. The patient is tolerating medications without difficulties.    Past Medical History:  Diagnosis Date   Arthritis    Coronary artery disease    past post circumflex obtuse marginal branch cutting balloon atherectomy by Dr. Irish Lack   Family history of heart disease    Hyperlipidemia    Hypertension    Obesity    Obstructive sleep apnea    on C Pap   Paroxysmal A-fib ([place])    Type 2 diabetes mellitus ([place])    Past Surgical History:  Procedure Laterality Date   APPENDECTOMY     ATRIAL FIBRILLATION ABLATION N/A 05/06/2020   Procedure: ATRIAL FIBRILLATION ABLATION;  Surgeon: Thompson Grayer, MD;  Location: [facility];  Service: Cardiovascular;   Laterality: N/A;   CARDIAC CATHETERIZATION N/A 10/22/2014   Procedure: Left Heart Cath and Coronary Angiography;  Surgeon: Jettie Booze, MD;  Location: [facility];  Service: Cardiovascular;  Laterality: N/A;   CARDIAC CATHETERIZATION Right 10/22/2014   Procedure: Coronary Stent Intervention;  Surgeon: Jettie Booze, MD;  Location: [facility];  Service: Cardiovascular;  Laterality: Right;   ELECTROPHYSIOLOGIC STUDY N/A 01/31/2015   Procedure: Atrial Fibrillation Ablation;  Surgeon: Thompson Grayer, MD;  Location: [facility];  Service: Cardiovascular;  Laterality: N/A;   TEE WITHOUT CARDIOVERSION N/A 01/30/2015   Procedure: TRANSESOPHAGEAL ECHOCARDIOGRAM (TEE);  Surgeon: Josue Hector, MD;  Location: [facility];  Service: Cardiovascular;  Laterality: N/A;     Current Outpatient Medications  Medication Sig Dispense Refill   carvedilol (COREG) 25 MG tablet Take 25 mg by mouth 2 (two) times daily with a meal.      cholecalciferol (VITAMIN D) 25 MCG (1000 UNIT) tablet Take 1,000 Units by mouth every evening.     Continuous Blood Gluc Receiver (FREESTYLE LIBRE 2 READER) DEVI Use as directed to monitor blood glucose     Continuous Blood Gluc Sensor (FREESTYLE LIBRE 2 SENSOR) MISC Use as directed every 14 days     diclofenac sodium (VOLTAREN) 1 % GEL Apply 1 application topically 3 (three) times daily as needed (pain relief).     diltiazem (CARDIZEM) 30 MG tablet TAKE ONE TABLET EVERY FOUR HOURS AS NEEDED FOR HEART RATE >100 appt needed for refill [id number] 30 tablet 0  dofetilide (TIKOSYN) 500 MCG capsule TAKE ONE CAPSULE BY MOUTH TWICE DAILY 180 capsule 2   ELIQUIS 5 MG TABS tablet TAKE 1 TABLET BY MOUTH TWICE A DAY 180 tablet 1   Evolocumab (REPATHA SURECLICK) 086 MG/ML SOAJ INJECT 1 PEN INTO THE SKIN EVERY 14 (FOURTEEN) DAYS. 6 mL 3   INVOKANA 300 MG TABS tablet Take 300 mg by mouth daily.  4   Krill Oil 1000 MG CAPS Take 3 capsules by mouth every morning.      lisinopril (ZESTRIL) 20 MG tablet TAKE ONE TABLET BY MOUTH EVERY DAY 90 tablet 1   metFORMIN (GLUCOPHAGE) 1000 MG tablet Take 1,000 mg by mouth 2 (two) times daily with a meal.      Multiple Vitamin (MULTIVITAMIN WITH MINERALS) TABS tablet Take 1 tablet by mouth daily.     naproxen sodium (ALEVE) 220 MG tablet Take 440 mg by mouth in the morning and at bedtime.     nitroGLYCERIN (NITROSTAT) 0.4 MG SL tablet Place 1 tablet (0.4 mg total) under the tongue every 5 (five) minutes as needed for chest pain. 25 tablet 4   NON FORMULARY 1 Dose by Other route at bedtime. Cpap 8cm H2O     NOVOLOG MIX 70/30 (70-30) 100 UNIT/ML injection Inject 0-15 Units into the skin in the morning and at bedtime. Sliding scale  4   OZEMPIC, 0.25 OR 0.5 MG/DOSE, 2 MG/3ML SOPN Inject 0.5 mg into the skin once a week.     sildenafil (REVATIO) 20 MG tablet Taking 2-5 tablets by mouth as needed     No current facility-administered medications for this visit.    Allergies:   Statins   Social History:  The patient  reports that he has never smoked. He has never used smokeless tobacco. He reports current alcohol use of about 14.0 - 30.0 standard drinks of alcohol per week. He reports that he does not use drugs.   Family History:  The patient's family history includes Atrial fibrillation in his mother; Heart attack (age of onset: 75) in his father.    ROS:  Please see the history of present illness.   Otherwise, review of systems is positive for none.   All other systems are reviewed and negative.    PHYSICAL EXAM: VS:  BP (!) 163/82   Pulse 69   Ht 5\' 11"  (1.803 m)   Wt 239 lb 9.6 oz (108.7 kg)   SpO2 96%   BMI 33.42 kg/m  , BMI Body mass index is 33.42 kg/m. GEN: Well nourished, well developed, in no acute distress  HEENT: normal  Neck: no JVD, carotid bruits, or masses Cardiac: RRR; no murmurs, rubs, or gallops,no edema  Respiratory:  clear to auscultation bilaterally, normal work of breathing GI: soft,  nontender, nondistended, + BS MS: no deformity or atrophy  Skin: warm and dry Neuro:  Strength and sensation are intact Psych: euthymic mood, full affect  EKG:  EKG is not ordered today. Personal review of the ekg ordered 02/09/22 shows sinus rhythm, rate 75  Recent Labs: 03/19/2021: B Natriuretic Peptide 163.1 02/09/2022: BUN 18; Creatinine, Ser 1.10; Magnesium 2.5; Potassium 4.4; Sodium 140    Lipid Panel     Component Value Date/Time   CHOL 199 07/05/2017 0947   TRIG 123 07/05/2017 0947   HDL 53 07/05/2017 0947   CHOLHDL 3.8 07/05/2017 0947   CHOLHDL 2.8 04/09/2015 0839   VLDL 24 04/09/2015 0839   LDLCALC 121 (H) 07/05/2017 5784  Wt Readings from Last 3 Encounters:  02/16/22 239 lb 9.6 oz (108.7 kg)  02/09/22 237 lb 6.4 oz (107.7 kg)  04/24/21 263 lb (119.3 kg)      Other studies Reviewed: Additional studies/ records that were reviewed today include: TTE 04/07/22  Review of the above records today demonstrates:   1. Left ventricular ejection fraction, by estimation, is 50 to 55%. The  left ventricle has low normal function. The left ventricle has no regional  wall motion abnormalities. Left ventricular diastolic function could not  be evaluated.   2. Right ventricular systolic function was not well visualized. The right  ventricular size is normal. Tricuspid regurgitation signal is inadequate  for assessing PA pressure.   3. The mitral valve is normal in structure. Trivial mitral valve  regurgitation. No evidence of mitral stenosis.   4. The aortic valve is calcified. There is mild calcification of the  aortic valve. There is moderate thickening of the aortic valve. Aortic  valve regurgitation is not visualized. Mild to moderate aortic valve  sclerosis/calcification is present, without  any evidence of aortic stenosis.   5. The inferior vena cava is dilated in size with >50% respiratory  variability, suggesting right atrial pressure of 8 mmHg.    ASSESSMENT  AND PLAN:  1.  Persistent atrial fibrillation: Currently on dofetilide 500 mcg twice daily, carvedilol 25 mg twice daily, Eliquis 5 mg twice daily.  CHA2DS2-VASc of 3.  He is unfortunately continued to have episodes of atrial fibrillation despite dofetilide and ablation x2.  He reports for 1 last try it endocardial ablation at opposed to hybrid surgical ablation or switching to amiodarone.  Risk, benefits, and alternatives to EP study and radiofrequency ablation for afib were also discussed in detail today. These risks include but are not limited to stroke, bleeding, vascular damage, tamponade, perforation, damage to the esophagus, lungs, and other structures, pulmonary vein stenosis, worsening renal function, and death. The patient understands these risk and wishes to proceed.  We Makalia Bare therefore proceed with catheter ablation at the next available time.  Carto, ICE, anesthesia are requested for the procedure.  Coumba Kellison also obtain CT PV protocol prior to the procedure to exclude LAA thrombus and further evaluate atrial anatomy.  2.  Secondary hypercoagulable state: Currently on Eliquis for atrial fibrillation and  3.  Hypertension: Elevated today.  Usually well controlled.  No changes.    Current medicines are reviewed at length with the patient today.   The patient does not have concerns regarding his medicines.  The following changes were made today:  none  Labs/ tests ordered today include:  Orders Placed This Encounter  Procedures   CT CARDIAC MORPH/PULM VEIN W/CM&W/O CA SCORE     Disposition:   FU with Broderick Fonseca 3 months  Signed, Emari Hreha Meredith Leeds, MD  02/16/2022 1:43 PM     [address] [place] [address] [phone] (office) [phone] (fax)

## 2022-02-27 ENCOUNTER — Other Ambulatory Visit (HOSPITAL_COMMUNITY): Payer: Self-pay | Admitting: Internal Medicine

## 2022-02-27 DIAGNOSIS — E7800 Pure hypercholesterolemia, unspecified: Secondary | ICD-10-CM

## 2022-03-04 ENCOUNTER — Encounter: Payer: Self-pay | Admitting: Cardiology

## 2022-03-17 ENCOUNTER — Telehealth: Payer: Self-pay

## 2022-03-17 NOTE — Patient Outreach (Signed)
  Care Coordination   03/17/2022 Name: Dennis King MRN: [id number] DOB: August 06, 1950   Care Coordination Outreach Attempts:  An unsuccessful telephone outreach was attempted today to offer the patient information about available care coordination services as a benefit of their health plan.   Follow Up Plan:  Additional outreach attempts will be made to offer the patient care coordination information and services.   Encounter Outcome:  No Answer  Care Coordination Interventions Activated:  No   Care Coordination Interventions:  No, not indicated    Peter Garter RN, BSN,CCM, [facility] [phone]

## 2022-03-26 ENCOUNTER — Other Ambulatory Visit: Payer: Self-pay | Admitting: Cardiovascular Disease

## 2022-03-26 NOTE — Telephone Encounter (Signed)
Prescription refill request for Eliquis received. Indication:afib Last office visit:9/23 Scr:1.1 Age: 71 Weight:108.7 kg  Prescription refilled

## 2022-03-29 ENCOUNTER — Other Ambulatory Visit (HOSPITAL_COMMUNITY): Payer: Self-pay | Admitting: Nurse Practitioner

## 2022-03-29 ENCOUNTER — Other Ambulatory Visit: Payer: Self-pay | Admitting: Cardiovascular Disease

## 2022-03-30 DIAGNOSIS — I10 Essential (primary) hypertension: Secondary | ICD-10-CM | POA: Diagnosis not present

## 2022-03-30 DIAGNOSIS — E782 Mixed hyperlipidemia: Secondary | ICD-10-CM | POA: Diagnosis not present

## 2022-03-30 DIAGNOSIS — E1142 Type 2 diabetes mellitus with diabetic polyneuropathy: Secondary | ICD-10-CM | POA: Diagnosis not present

## 2022-03-30 DIAGNOSIS — I2510 Atherosclerotic heart disease of native coronary artery without angina pectoris: Secondary | ICD-10-CM | POA: Diagnosis not present

## 2022-03-30 DIAGNOSIS — G8929 Other chronic pain: Secondary | ICD-10-CM | POA: Diagnosis not present

## 2022-03-30 DIAGNOSIS — E7800 Pure hypercholesterolemia, unspecified: Secondary | ICD-10-CM | POA: Diagnosis not present

## 2022-05-10 ENCOUNTER — Other Ambulatory Visit: Payer: Self-pay | Admitting: Physician Assistant

## 2022-05-10 NOTE — Telephone Encounter (Signed)
Rx refill sent to pharmacy. 

## 2022-05-20 ENCOUNTER — Other Ambulatory Visit (HOSPITAL_COMMUNITY): Payer: Self-pay | Admitting: Internal Medicine

## 2022-05-21 DIAGNOSIS — E782 Mixed hyperlipidemia: Secondary | ICD-10-CM | POA: Diagnosis not present

## 2022-05-21 DIAGNOSIS — Z7901 Long term (current) use of anticoagulants: Secondary | ICD-10-CM | POA: Diagnosis not present

## 2022-05-21 DIAGNOSIS — I10 Essential (primary) hypertension: Secondary | ICD-10-CM | POA: Diagnosis not present

## 2022-05-21 DIAGNOSIS — I2510 Atherosclerotic heart disease of native coronary artery without angina pectoris: Secondary | ICD-10-CM | POA: Diagnosis not present

## 2022-05-21 DIAGNOSIS — Z0000 Encounter for general adult medical examination without abnormal findings: Secondary | ICD-10-CM | POA: Diagnosis not present

## 2022-05-21 DIAGNOSIS — N400 Enlarged prostate without lower urinary tract symptoms: Secondary | ICD-10-CM | POA: Diagnosis not present

## 2022-05-21 DIAGNOSIS — I480 Paroxysmal atrial fibrillation: Secondary | ICD-10-CM | POA: Diagnosis not present

## 2022-05-21 DIAGNOSIS — Z23 Encounter for immunization: Secondary | ICD-10-CM | POA: Diagnosis not present

## 2022-05-21 DIAGNOSIS — E1142 Type 2 diabetes mellitus with diabetic polyneuropathy: Secondary | ICD-10-CM | POA: Diagnosis not present

## 2022-06-15 ENCOUNTER — Ambulatory Visit (HOSPITAL_COMMUNITY)
Admission: RE | Admit: 2022-06-15 | Discharge: 2022-06-15 | Disposition: A | Discharge status: Home / Self Care | Payer: PPO | Source: Ambulatory Visit | Attending: Physician Assistant | Admitting: Physician Assistant

## 2022-06-15 ENCOUNTER — Encounter (HOSPITAL_COMMUNITY): Payer: Self-pay | Admitting: Physician Assistant

## 2022-06-15 VITALS — BP 144/84 | HR 76 | Ht 71.0 in | Wt 233.0 lb

## 2022-06-15 DIAGNOSIS — G4733 Obstructive sleep apnea (adult) (pediatric): Secondary | ICD-10-CM | POA: Insufficient documentation

## 2022-06-15 DIAGNOSIS — I10 Essential (primary) hypertension: Secondary | ICD-10-CM | POA: Diagnosis not present

## 2022-06-15 DIAGNOSIS — Z79899 Other long term (current) drug therapy: Secondary | ICD-10-CM | POA: Diagnosis not present

## 2022-06-15 DIAGNOSIS — E118 Type 2 diabetes mellitus with unspecified complications: Secondary | ICD-10-CM | POA: Insufficient documentation

## 2022-06-15 DIAGNOSIS — D6869 Other thrombophilia: Secondary | ICD-10-CM | POA: Diagnosis not present

## 2022-06-15 DIAGNOSIS — Z7984 Long term (current) use of oral hypoglycemic drugs: Secondary | ICD-10-CM | POA: Diagnosis not present

## 2022-06-15 DIAGNOSIS — I4819 Other persistent atrial fibrillation: Secondary | ICD-10-CM | POA: Diagnosis not present

## 2022-06-15 DIAGNOSIS — Z7901 Long term (current) use of anticoagulants: Secondary | ICD-10-CM | POA: Insufficient documentation

## 2022-06-15 LAB — CBC
HCT: 44.3 % (ref 39.0–52.0)
Hemoglobin: 14.3 g/dL (ref 13.0–17.0)
MCH: 30.2 pg (ref 26.0–34.0)
MCHC: 32.3 g/dL (ref 30.0–36.0)
MCV: 93.7 fL (ref 80.0–100.0)
Platelets: 257 10*3/uL (ref 150–400)
RBC: 4.73 MIL/uL (ref 4.22–5.81)
RDW: 12.8 % (ref 11.5–15.5)
WBC: 7.8 10*3/uL (ref 4.0–10.5)
nRBC: 0 % (ref 0.0–0.2)

## 2022-06-15 LAB — BASIC METABOLIC PANEL
Anion gap: 10 (ref 5–15)
BUN: 25 mg/dL — ABNORMAL HIGH (ref 8–23)
CO2: 27 mmol/L (ref 22–32)
Calcium: 9.8 mg/dL (ref 8.9–10.3)
Chloride: 99 mmol/L (ref 98–111)
Creatinine, Ser: 1.15 mg/dL (ref 0.61–1.24)
GFR, Estimated: >60 mL/min (ref >60)
Glucose, Bld: 150 mg/dL — ABNORMAL HIGH (ref 70–99)
Potassium: 3.8 mmol/L (ref 3.5–5.1)
Sodium: 136 mmol/L (ref 135–145)

## 2022-06-15 LAB — MAGNESIUM: Magnesium: 2.4 mg/dL (ref 1.7–2.4)

## 2022-06-15 MED ORDER — LISINOPRIL 20 MG PO TABS: 20.0000 mg | ORAL_TABLET | Freq: Every day | ORAL | 6 refills | Status: DC

## 2022-06-15 NOTE — Patient Instructions (Signed)
Stop lisinopril/hctz  Start lisinopril 20mg  once a day

## 2022-06-15 NOTE — H&P (View-Only) (Signed)
Primary Care Physician: Aura Dials, MD Referring Physician: Dr. 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 Rayann Heman. He stopped Multaq and HCTZ on 9/28. He called the office yesterday as he was having some shortness of breath and fluid retention. He was given lasix/K+ yesterday and again this am and this helped with his symptoms.  He has not missed any anticoagulation for at least 3 weeks  and no benadryl use. He has cut back but continues with daily alcohol and caffeine use.   F/u in afib clinic, 03/25/21. He is one week out from [place] load. He had been feeling so much better and was surprised that he was back in afib this am. He was placed on spironolactone in the hospital and has lost 10 lbs. Is feeling much improved from  abdominal swelling and exertional shortness of breath.   F/u in afib clinic, 02/09/22 for Tikosyn surveillance. He reports having afib about every 2 weeks lasing around 12 hours. He has had 2 ablations in the past with Dr. Rayann Heman. He is complaint with tikosyn.  We discussed today establishing with another EP in Dr. Jackalyn Lombard absence to see if a 3 rd ablation would be necessary and he is interested to proceed.   Follow up in the AF clinic 06/15/22. Patient reports that he has an episode of palpitations and fatigue once every 1-2 weeks. They typically last 2-3 hours but can be up to 12 hours. He was started on lisinopril HCTZ 3 days ago by his PCP. He is scheduled for an afib ablation 07/14/22.  Today, he denies symptoms of chest pain, shortness of breath, orthopnea, PND, lower extremity edema, dizziness, presyncope, syncope, or neurologic sequela. The patient is tolerating medications without difficulties and is otherwise without complaint today.   Past Medical History:  Diagnosis Date   Arthritis     Coronary artery disease    past post circumflex obtuse marginal branch cutting balloon atherectomy by Dr. Irish Lack   Family history of heart disease    Hyperlipidemia    Hypertension    Obesity    Obstructive sleep apnea    on C Pap   Paroxysmal A-fib ([place])    Type 2 diabetes mellitus ([place])    Past Surgical History:  Procedure Laterality Date   APPENDECTOMY     ATRIAL FIBRILLATION ABLATION N/A 05/06/2020   Procedure: ATRIAL FIBRILLATION ABLATION;  Surgeon: Thompson Grayer, MD;  Location: [facility];  Service: Cardiovascular;  Laterality: N/A;   CARDIAC CATHETERIZATION N/A 10/22/2014   Procedure: Left Heart Cath and Coronary Angiography;  Surgeon: Jettie Booze, MD;  Location: [facility];  Service: Cardiovascular;  Laterality: N/A;   CARDIAC CATHETERIZATION Right 10/22/2014   Procedure: Coronary Stent Intervention;  Surgeon: Jettie Booze, MD;  Location: [facility];  Service: Cardiovascular;  Laterality: Right;   ELECTROPHYSIOLOGIC STUDY N/A 01/31/2015   Procedure: Atrial Fibrillation Ablation;  Surgeon: Thompson Grayer, MD;  Location: [facility];  Service: Cardiovascular;  Laterality: N/A;   TEE WITHOUT CARDIOVERSION N/A 01/30/2015   Procedure: TRANSESOPHAGEAL ECHOCARDIOGRAM (TEE);  Surgeon: Josue Hector, MD;  Location: [facility];  Service: Cardiovascular;  Laterality: N/A;    Current Outpatient Medications  Medication Sig Dispense Refill   apixaban (ELIQUIS) 5 MG TABS tablet  TAKE ONE TABLET BY MOUTH TWICE DAILY 180 tablet 1   carvedilol (COREG) 25 MG tablet Take 25 mg by mouth 2 (two) times daily with a meal.      cholecalciferol (VITAMIN D) 25 MCG (1000 UNIT) tablet Take 1,000 Units by mouth every evening.     Continuous Blood Gluc Receiver (FREESTYLE LIBRE 2 READER) DEVI Use as directed to monitor blood glucose     Continuous Blood Gluc Sensor (FREESTYLE LIBRE 2 SENSOR) MISC Use as directed every 14 days     diclofenac sodium (VOLTAREN) 1 %  GEL Apply 1 application topically 3 (three) times daily as needed (pain relief).     diltiazem (CARDIZEM) 30 MG tablet TAKE ONE TABLET EVERY FOUR HOURS AS NEEDED FOR HEART RATE >100 30 tablet 0   dofetilide (TIKOSYN) 500 MCG capsule Take 1 capsule (500 mcg total) by mouth 2 (two) times daily. 180 capsule 0   INVOKANA 300 MG TABS tablet Take 300 mg by mouth daily.  4   Krill Oil 1000 MG CAPS Take 3 capsules by mouth every morning.     lisinopril (ZESTRIL) 20 MG tablet Take 1 tablet (20 mg total) by mouth daily. 30 tablet 6   metFORMIN (GLUCOPHAGE) 1000 MG tablet Take 1,000 mg by mouth 2 (two) times daily with a meal.      Multiple Vitamin (MULTIVITAMIN WITH MINERALS) TABS tablet Take 1 tablet by mouth daily.     naproxen sodium (ALEVE) 220 MG tablet Take 440 mg by mouth in the morning and at bedtime.     nitroGLYCERIN (NITROSTAT) 0.4 MG SL tablet Place 1 tablet (0.4 mg total) under the tongue every 5 (five) minutes as needed for chest pain. 25 tablet 4   NON FORMULARY 1 Dose by Other route at bedtime. Cpap 8cm H2O     NOVOLOG MIX 70/30 (70-30) 100 UNIT/ML injection Inject 0-15 Units into the skin in the morning and at bedtime. Sliding scale  4   OZEMPIC, 0.25 OR 0.5 MG/DOSE, 2 MG/3ML SOPN Inject 0.5 mg into the skin once a week.     REPATHA SURECLICK [id number] MG/ML SOAJ INJECT ONE PEN INTO THE SKIN EVERY 14 DAYS 6 mL 2   sildenafil (REVATIO) 20 MG tablet Taking 2-5 tablets by mouth as needed     No current facility-administered medications for this encounter.    Allergies  Allergen Reactions   Statins Other (See Comments)    Specifically Atorvastatin and Crestor - myalgia    Social History   Socioeconomic History   Marital status: Married    Spouse name: Not on file   Number of children: 2   Years of education: Not on file   Highest education level: Not on file  Occupational History   Not on file  Tobacco Use   Smoking status: Never   Smokeless tobacco: Never   Tobacco comments:     Never smoke 06/15/22  Vaping Use   Vaping Use: Never used  Substance and Sexual Activity   Alcohol use: Yes    Alcohol/week: 7.0 - 14.0 standard drinks of alcohol    Types: 7 - 14 Glasses of wine per week    Comment: 1-2 glass of wine weekly 06/15/22   Drug use: No   Sexual activity: Not on file  Other Topics Concern   Not on file  Social History Narrative   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   Social Determinants of Health  Financial Resource Strain: Not on file  Food Insecurity: Not on file  Transportation Needs: Not on file  Physical Activity: Not on file  Stress: Not on file  Social Connections: Not on file  Intimate Partner Violence: Not on file    Family History  Problem Relation Age of Onset   Heart attack Father 34       Died age 96   Atrial fibrillation Mother     ROS- All systems are reviewed and negative except as per the HPI above  Physical Exam: Vitals:   06/15/22 0832  BP: (!) 144/84  Pulse: 76  Weight: 105.7 kg  Height: 5' 11"$  (1.803 m)    Wt Readings from Last 3 Encounters:  06/15/22 105.7 kg  02/16/22 108.7 kg  02/09/22 107.7 kg    Labs: Lab Results  Component Value Date   NA 136 06/15/2022   K 3.8 06/15/2022   CL 99 06/15/2022   CO2 27 06/15/2022   GLUCOSE 150 (H) 06/15/2022   BUN 25 (H) 06/15/2022   CREATININE 1.15 06/15/2022   CALCIUM 9.8 06/15/2022   MG 2.4 06/15/2022   Lab Results  Component Value Date   INR 1.12 10/22/2014   Lab Results  Component Value Date   CHOL 199 07/05/2017   HDL 53 07/05/2017   LDLCALC 121 (H) 07/05/2017   TRIG 123 07/05/2017     GEN- The patient is a well appearing male, alert and oriented x 3 today.   HEENT-head normocephalic, atraumatic, sclera clear, conjunctiva pink, hearing intact, trachea midline. Lungs- Clear to ausculation bilaterally, normal work of breathing Heart- Regular rate and rhythm, no murmurs, rubs or gallops  GI- soft, NT, ND, +  BS Extremities- no clubbing, cyanosis, or edema MS- no significant deformity or atrophy Skin- no rash or lesion Psych- euthymic mood, full affect Neuro- strength and sensation are intact   EKG-  SR Vent. rate 76 BPM PR interval 150 ms QRS duration 92 ms QT/QTcB 424/477 ms   Echo 04/07/20 1. Left ventricular ejection fraction, by estimation, is 50 to 55%. The  left ventricle has low normal function. The left ventricle has no regional wall motion abnormalities. Left ventricular diastolic function could not be evaluated.   2. Right ventricular systolic function was not well visualized. The right ventricular size is normal. Tricuspid regurgitation signal is inadequate for assessing PA pressure.   3. The mitral valve is normal in structure. Trivial mitral valve  regurgitation. No evidence of mitral stenosis.   4. The aortic valve is calcified. There is mild calcification of the  aortic valve. There is moderate thickening of the aortic valve. Aortic  valve regurgitation is not visualized. Mild to moderate aortic valve  sclerosis/calcification is present, without  any evidence of aortic stenosis.   5. The inferior vena cava is dilated in size with >50% respiratory  variability, suggesting right atrial pressure of 8 mmHg.    CHA2DS2-VASc Score = 3  The patient's score is based upon: CHF History: 0 HTN History: 1 Diabetes History: 1 Stroke History: 0 Vascular Disease History: 0 Age Score: 1 Gender Score: 0       ASSESSMENT AND PLAN: 1. Persistent Atrial Fibrillation (ICD10:  I48.19) The patient's CHA2DS2-VASc score is 3, indicating a 3.2% annual risk of stroke.   S/p ablation x 2 Scheduled for ablation with Dr Curt Bears on 07/14/22 CT scheduled ro 07/08/22 Continue dofetilide 500 mcg BID. Fortunately his QT is stable. He will stop HCTZ immediately.  Check bmet/mag/cbc today  Continue carvedilol 25 mg BID Continue Eliquis 5 mg BID  2. Secondary Hypercoagulable State (ICD10:   D68.69) The patient is at significant risk for stroke/thromboembolism based upon his CHA2DS2-VASc Score of 3.  Continue Apixaban (Eliquis).   3. HTN Patient recently started on lisinopril-HCTZ for his BP. HCTZ is contraindicated with dofetilide and can not be used. Could consider increasing lisinopril dose or adding CCB like amlodipine if needed.   4. OSA Encouraged compliance with CPAP therapy.   Follow up for afib ablation as scheduled.    [facility] [address] [place], [place] [address] [phone]

## 2022-06-15 NOTE — Progress Notes (Signed)
Primary Care Physician: Aura Dials, MD Referring Physician: Dr. 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 Rayann Heman. He stopped Multaq and HCTZ on 9/28. He called the office yesterday as he was having some shortness of breath and fluid retention. He was given lasix/K+ yesterday and again this am and this helped with his symptoms.  He has not missed any anticoagulation for at least 3 weeks  and no benadryl use. He has cut back but continues with daily alcohol and caffeine use.   F/u in afib clinic, 03/25/21. He is one week out from [place] load. He had been feeling so much better and was surprised that he was back in afib this am. He was placed on spironolactone in the hospital and has lost 10 lbs. Is feeling much improved from  abdominal swelling and exertional shortness of breath.   F/u in afib clinic, 02/09/22 for Tikosyn surveillance. He reports having afib about every 2 weeks lasing around 12 hours. He has had 2 ablations in the past with Dr. Rayann Heman. He is complaint with tikosyn.  We discussed today establishing with another EP in Dr. Jackalyn Lombard absence to see if a 3 rd ablation would be necessary and he is interested to proceed.   Follow up in the AF clinic 06/15/22. Patient reports that he has an episode of palpitations and fatigue once every 1-2 weeks. They typically last 2-3 hours but can be up to 12 hours. He was started on lisinopril HCTZ 3 days ago by his PCP. He is scheduled for an afib ablation 07/14/22.  Today, he denies symptoms of chest pain, shortness of breath, orthopnea, PND, lower extremity edema, dizziness, presyncope, syncope, or neurologic sequela. The patient is tolerating medications without difficulties and is otherwise without complaint today.   Past Medical History:  Diagnosis Date   Arthritis     Coronary artery disease    past post circumflex obtuse marginal branch cutting balloon atherectomy by Dr. Irish Lack   Family history of heart disease    Hyperlipidemia    Hypertension    Obesity    Obstructive sleep apnea    on C Pap   Paroxysmal A-fib ([place])    Type 2 diabetes mellitus ([place])    Past Surgical History:  Procedure Laterality Date   APPENDECTOMY     ATRIAL FIBRILLATION ABLATION N/A 05/06/2020   Procedure: ATRIAL FIBRILLATION ABLATION;  Surgeon: Thompson Grayer, MD;  Location: [facility];  Service: Cardiovascular;  Laterality: N/A;   CARDIAC CATHETERIZATION N/A 10/22/2014   Procedure: Left Heart Cath and Coronary Angiography;  Surgeon: Jettie Booze, MD;  Location: [facility];  Service: Cardiovascular;  Laterality: N/A;   CARDIAC CATHETERIZATION Right 10/22/2014   Procedure: Coronary Stent Intervention;  Surgeon: Jettie Booze, MD;  Location: [facility];  Service: Cardiovascular;  Laterality: Right;   ELECTROPHYSIOLOGIC STUDY N/A 01/31/2015   Procedure: Atrial Fibrillation Ablation;  Surgeon: Thompson Grayer, MD;  Location: [facility];  Service: Cardiovascular;  Laterality: N/A;   TEE WITHOUT CARDIOVERSION N/A 01/30/2015   Procedure: TRANSESOPHAGEAL ECHOCARDIOGRAM (TEE);  Surgeon: Josue Hector, MD;  Location: [facility];  Service: Cardiovascular;  Laterality: N/A;    Current Outpatient Medications  Medication Sig Dispense Refill   apixaban (ELIQUIS) 5 MG TABS tablet  TAKE ONE TABLET BY MOUTH TWICE DAILY 180 tablet 1   carvedilol (COREG) 25 MG tablet Take 25 mg by mouth 2 (two) times daily with a meal.      cholecalciferol (VITAMIN D) 25 MCG (1000 UNIT) tablet Take 1,000 Units by mouth every evening.     Continuous Blood Gluc Receiver (FREESTYLE LIBRE 2 READER) DEVI Use as directed to monitor blood glucose     Continuous Blood Gluc Sensor (FREESTYLE LIBRE 2 SENSOR) MISC Use as directed every 14 days     diclofenac sodium (VOLTAREN) 1 %  GEL Apply 1 application topically 3 (three) times daily as needed (pain relief).     diltiazem (CARDIZEM) 30 MG tablet TAKE ONE TABLET EVERY FOUR HOURS AS NEEDED FOR HEART RATE >100 30 tablet 0   dofetilide (TIKOSYN) 500 MCG capsule Take 1 capsule (500 mcg total) by mouth 2 (two) times daily. 180 capsule 0   INVOKANA 300 MG TABS tablet Take 300 mg by mouth daily.  4   Krill Oil 1000 MG CAPS Take 3 capsules by mouth every morning.     lisinopril (ZESTRIL) 20 MG tablet Take 1 tablet (20 mg total) by mouth daily. 30 tablet 6   metFORMIN (GLUCOPHAGE) 1000 MG tablet Take 1,000 mg by mouth 2 (two) times daily with a meal.      Multiple Vitamin (MULTIVITAMIN WITH MINERALS) TABS tablet Take 1 tablet by mouth daily.     naproxen sodium (ALEVE) 220 MG tablet Take 440 mg by mouth in the morning and at bedtime.     nitroGLYCERIN (NITROSTAT) 0.4 MG SL tablet Place 1 tablet (0.4 mg total) under the tongue every 5 (five) minutes as needed for chest pain. 25 tablet 4   NON FORMULARY 1 Dose by Other route at bedtime. Cpap 8cm H2O     NOVOLOG MIX 70/30 (70-30) 100 UNIT/ML injection Inject 0-15 Units into the skin in the morning and at bedtime. Sliding scale  4   OZEMPIC, 0.25 OR 0.5 MG/DOSE, 2 MG/3ML SOPN Inject 0.5 mg into the skin once a week.     REPATHA SURECLICK [id number] MG/ML SOAJ INJECT ONE PEN INTO THE SKIN EVERY 14 DAYS 6 mL 2   sildenafil (REVATIO) 20 MG tablet Taking 2-5 tablets by mouth as needed     No current facility-administered medications for this encounter.    Allergies  Allergen Reactions   Statins Other (See Comments)    Specifically Atorvastatin and Crestor - myalgia    Social History   Socioeconomic History   Marital status: Married    Spouse name: Not on file   Number of children: 2   Years of education: Not on file   Highest education level: Not on file  Occupational History   Not on file  Tobacco Use   Smoking status: Never   Smokeless tobacco: Never   Tobacco comments:     Never smoke 06/15/22  Vaping Use   Vaping Use: Never used  Substance and Sexual Activity   Alcohol use: Yes    Alcohol/week: 7.0 - 14.0 standard drinks of alcohol    Types: 7 - 14 Glasses of wine per week    Comment: 1-2 glass of wine weekly 06/15/22   Drug use: No   Sexual activity: Not on file  Other Topics Concern   Not on file  Social History Narrative   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   Social Determinants of Health  Financial Resource Strain: Not on file  Food Insecurity: Not on file  Transportation Needs: Not on file  Physical Activity: Not on file  Stress: Not on file  Social Connections: Not on file  Intimate Partner Violence: Not on file    Family History  Problem Relation Age of Onset   Heart attack Father 34       Died age 96   Atrial fibrillation Mother     ROS- All systems are reviewed and negative except as per the HPI above  Physical Exam: Vitals:   06/15/22 0832  BP: (!) 144/84  Pulse: 76  Weight: 105.7 kg  Height: 5' 11"$  (1.803 m)    Wt Readings from Last 3 Encounters:  06/15/22 105.7 kg  02/16/22 108.7 kg  02/09/22 107.7 kg    Labs: Lab Results  Component Value Date   NA 136 06/15/2022   K 3.8 06/15/2022   CL 99 06/15/2022   CO2 27 06/15/2022   GLUCOSE 150 (H) 06/15/2022   BUN 25 (H) 06/15/2022   CREATININE 1.15 06/15/2022   CALCIUM 9.8 06/15/2022   MG 2.4 06/15/2022   Lab Results  Component Value Date   INR 1.12 10/22/2014   Lab Results  Component Value Date   CHOL 199 07/05/2017   HDL 53 07/05/2017   LDLCALC 121 (H) 07/05/2017   TRIG 123 07/05/2017     GEN- The patient is a well appearing male, alert and oriented x 3 today.   HEENT-head normocephalic, atraumatic, sclera clear, conjunctiva pink, hearing intact, trachea midline. Lungs- Clear to ausculation bilaterally, normal work of breathing Heart- Regular rate and rhythm, no murmurs, rubs or gallops  GI- soft, NT, ND, +  BS Extremities- no clubbing, cyanosis, or edema MS- no significant deformity or atrophy Skin- no rash or lesion Psych- euthymic mood, full affect Neuro- strength and sensation are intact   EKG-  SR Vent. rate 76 BPM PR interval 150 ms QRS duration 92 ms QT/QTcB 424/477 ms   Echo 04/07/20 1. Left ventricular ejection fraction, by estimation, is 50 to 55%. The  left ventricle has low normal function. The left ventricle has no regional wall motion abnormalities. Left ventricular diastolic function could not be evaluated.   2. Right ventricular systolic function was not well visualized. The right ventricular size is normal. Tricuspid regurgitation signal is inadequate for assessing PA pressure.   3. The mitral valve is normal in structure. Trivial mitral valve  regurgitation. No evidence of mitral stenosis.   4. The aortic valve is calcified. There is mild calcification of the  aortic valve. There is moderate thickening of the aortic valve. Aortic  valve regurgitation is not visualized. Mild to moderate aortic valve  sclerosis/calcification is present, without  any evidence of aortic stenosis.   5. The inferior vena cava is dilated in size with >50% respiratory  variability, suggesting right atrial pressure of 8 mmHg.    CHA2DS2-VASc Score = 3  The patient's score is based upon: CHF History: 0 HTN History: 1 Diabetes History: 1 Stroke History: 0 Vascular Disease History: 0 Age Score: 1 Gender Score: 0       ASSESSMENT AND PLAN: 1. Persistent Atrial Fibrillation (ICD10:  I48.19) The patient's CHA2DS2-VASc score is 3, indicating a 3.2% annual risk of stroke.   S/p ablation x 2 Scheduled for ablation with Dr Curt Bears on 07/14/22 CT scheduled ro 07/08/22 Continue dofetilide 500 mcg BID. Fortunately his QT is stable. He will stop HCTZ immediately.  Check bmet/mag/cbc today  Continue carvedilol 25 mg BID Continue Eliquis 5 mg BID  2. Secondary Hypercoagulable State (ICD10:   D68.69) The patient is at significant risk for stroke/thromboembolism based upon his CHA2DS2-VASc Score of 3.  Continue Apixaban (Eliquis).   3. HTN Patient recently started on lisinopril-HCTZ for his BP. HCTZ is contraindicated with dofetilide and can not be used. Could consider increasing lisinopril dose or adding CCB like amlodipine if needed.   4. OSA Encouraged compliance with CPAP therapy.   Follow up for afib ablation as scheduled.    [facility] [address] [place], [place] [address] [phone]

## 2022-06-23 ENCOUNTER — Other Ambulatory Visit (HOSPITAL_COMMUNITY): Payer: Self-pay

## 2022-07-01 ENCOUNTER — Encounter (HOSPITAL_COMMUNITY): Payer: Self-pay | Admitting: *Deleted

## 2022-07-07 ENCOUNTER — Telehealth (HOSPITAL_COMMUNITY): Payer: Self-pay | Admitting: *Deleted

## 2022-07-07 NOTE — Telephone Encounter (Signed)
Attempted to call patient regarding upcoming cardiac CT appointment. °Left message on voicemail with name and callback number ° °Jessice Madill RN Navigator Cardiac Imaging °[facility] °[phone] Office °[phone] Cell ° °

## 2022-07-08 ENCOUNTER — Ambulatory Visit (HOSPITAL_BASED_OUTPATIENT_CLINIC_OR_DEPARTMENT_OTHER)
Admission: RE | Admit: 2022-07-08 | Discharge: 2022-07-08 | Disposition: A | Discharge status: Home / Self Care | Payer: PPO | Source: Ambulatory Visit | Attending: Cardiology | Admitting: Cardiology

## 2022-07-08 ENCOUNTER — Encounter (HOSPITAL_BASED_OUTPATIENT_CLINIC_OR_DEPARTMENT_OTHER): Payer: Self-pay

## 2022-07-08 DIAGNOSIS — I4819 Other persistent atrial fibrillation: Secondary | ICD-10-CM | POA: Diagnosis not present

## 2022-07-08 MED ORDER — IOHEXOL 350 MG/ML SOLN
100.0000 mL | Freq: Once | INTRAVENOUS | Status: AC | PRN
  Administered 2022-07-08: 80 mL via INTRAVENOUS

## 2022-07-13 ENCOUNTER — Encounter (HOSPITAL_COMMUNITY): Payer: Self-pay | Admitting: Cardiology

## 2022-07-13 NOTE — Anesthesia Preprocedure Evaluation (Signed)
Anesthesia Evaluation  Patient identified by MRN, date of birth, ID band Patient awake    Reviewed: Allergy & Precautions, NPO status , Patient's Chart, lab work & pertinent test results, reviewed documented beta blocker date and time   Airway Mallampati: III  TM Distance: >3 FB Neck ROM: Full    Dental no notable dental hx. (+) Teeth Intact, Caps, Dental Advisory Given   Pulmonary sleep apnea and Continuous Positive Airway Pressure Ventilation  Hx/o pulmonary nodules   Pulmonary exam normal breath sounds clear to auscultation       Cardiovascular hypertension, Pt. on medications and Pt. on home beta blockers + CAD, + Past MI, + Cardiac Stents and +CHF  Normal cardiovascular exam Rhythm:Regular Rate:Normal  Dilated Cardiomyopathy  EKG 06/15/22 NSR, Unusual P axis  Echo 04/07/20 1. Left ventricular ejection fraction, by estimation, is 50 to 55%. The  left ventricle has low normal function. The left ventricle has no regional  wall motion abnormalities. Left ventricular diastolic function could not  be evaluated.   2. Right ventricular systolic function was not well visualized. The right  ventricular size is normal. Tricuspid regurgitation signal is inadequate  for assessing PA pressure.   3. The mitral valve is normal in structure. Trivial mitral valve  regurgitation. No evidence of mitral stenosis.   4. The aortic valve is calcified. There is mild calcification of the  aortic valve. There is moderate thickening of the aortic valve. Aortic  valve regurgitation is not visualized. Mild to moderate aortic valve  sclerosis/calcification is present, without  any evidence of aortic stenosis.   5. The inferior vena cava is dilated in size with >50% respiratory  variability, suggesting right atrial pressure of 8 mmHg.   EKG   Neuro/Psych negative neurological ROS  negative psych ROS   GI/Hepatic negative GI ROS, Neg liver ROS,,,   Endo/Other  diabetes, Well Controlled, Type 2, Insulin Dependent, Oral Hypoglycemic Agents  Hyperlipidemia GLP-1 RA therapy- last injection yesterday Obesity  Renal/GU negative Renal ROS  negative genitourinary   Musculoskeletal  (+) Arthritis , Osteoarthritis,    Abdominal  (+) + obese  Peds  Hematology Eliquis therapy-last dose 9pm   Anesthesia Other Findings   Reproductive/Obstetrics                             Anesthesia Physical Anesthesia Plan  ASA: 3  Anesthesia Plan: General   Post-op Pain Management: Minimal or no pain anticipated   Induction: Intravenous, Rapid sequence and Cricoid pressure planned  PONV Risk Score and Plan: 2 and Treatment may vary due to age or medical condition and Ondansetron  Airway Management Planned: Oral ETT  Additional Equipment: None  Intra-op Plan:   Post-operative Plan: Extubation in OR  Informed Consent: I have reviewed the patients History and Physical, chart, labs and discussed the procedure including the risks, benefits and alternatives for the proposed anesthesia with the patient or authorized representative who has indicated his/her understanding and acceptance.     Dental advisory given  Plan Discussed with: CRNA and Anesthesiologist  Anesthesia Plan Comments:         Anesthesia Quick Evaluation

## 2022-07-13 NOTE — Pre-Procedure Instructions (Signed)
Attempted to call patient regarding procedure instructions.  Left voicemail on the following items: Arrival time 0600 Nothing to eat or drink after midnight No meds AM of procedure Responsible person to drive you home and stay with you for 24 hrs  Have you missed any doses of anti-coagulant Eliquis- if you have missed any doses please let office know.

## 2022-07-14 ENCOUNTER — Ambulatory Visit (HOSPITAL_COMMUNITY)
Admission: RE | Admit: 2022-07-14 | Discharge: 2022-07-14 | Disposition: A | Discharge status: Home / Self Care | Payer: PPO | Source: Ambulatory Visit | Attending: Cardiology | Admitting: Cardiology

## 2022-07-14 ENCOUNTER — Ambulatory Visit (HOSPITAL_BASED_OUTPATIENT_CLINIC_OR_DEPARTMENT_OTHER): Payer: PPO | Admitting: Anesthesiology

## 2022-07-14 ENCOUNTER — Encounter (HOSPITAL_COMMUNITY): Payer: Self-pay | Admitting: Cardiology

## 2022-07-14 ENCOUNTER — Ambulatory Visit (HOSPITAL_COMMUNITY): Payer: PPO | Admitting: Anesthesiology

## 2022-07-14 ENCOUNTER — Encounter (HOSPITAL_COMMUNITY)
Admission: RE | Disposition: A | Discharge status: Home / Self Care | Payer: Self-pay | Source: Ambulatory Visit | Attending: Cardiology

## 2022-07-14 ENCOUNTER — Other Ambulatory Visit: Payer: Self-pay

## 2022-07-14 DIAGNOSIS — E119 Type 2 diabetes mellitus without complications: Secondary | ICD-10-CM | POA: Diagnosis not present

## 2022-07-14 DIAGNOSIS — I2510 Atherosclerotic heart disease of native coronary artery without angina pectoris: Secondary | ICD-10-CM | POA: Insufficient documentation

## 2022-07-14 DIAGNOSIS — I4891 Unspecified atrial fibrillation: Secondary | ICD-10-CM

## 2022-07-14 DIAGNOSIS — I10 Essential (primary) hypertension: Secondary | ICD-10-CM | POA: Insufficient documentation

## 2022-07-14 DIAGNOSIS — Z7985 Long-term (current) use of injectable non-insulin antidiabetic drugs: Secondary | ICD-10-CM | POA: Insufficient documentation

## 2022-07-14 DIAGNOSIS — I110 Hypertensive heart disease with heart failure: Secondary | ICD-10-CM | POA: Diagnosis not present

## 2022-07-14 DIAGNOSIS — Z8249 Family history of ischemic heart disease and other diseases of the circulatory system: Secondary | ICD-10-CM | POA: Diagnosis not present

## 2022-07-14 DIAGNOSIS — Z794 Long term (current) use of insulin: Secondary | ICD-10-CM | POA: Diagnosis not present

## 2022-07-14 DIAGNOSIS — Z7984 Long term (current) use of oral hypoglycemic drugs: Secondary | ICD-10-CM | POA: Insufficient documentation

## 2022-07-14 DIAGNOSIS — I509 Heart failure, unspecified: Secondary | ICD-10-CM

## 2022-07-14 DIAGNOSIS — I429 Cardiomyopathy, unspecified: Secondary | ICD-10-CM | POA: Insufficient documentation

## 2022-07-14 DIAGNOSIS — Z7901 Long term (current) use of anticoagulants: Secondary | ICD-10-CM | POA: Diagnosis not present

## 2022-07-14 DIAGNOSIS — I252 Old myocardial infarction: Secondary | ICD-10-CM | POA: Diagnosis not present

## 2022-07-14 DIAGNOSIS — I4819 Other persistent atrial fibrillation: Secondary | ICD-10-CM | POA: Insufficient documentation

## 2022-07-14 DIAGNOSIS — D6859 Other primary thrombophilia: Secondary | ICD-10-CM | POA: Diagnosis not present

## 2022-07-14 DIAGNOSIS — G4733 Obstructive sleep apnea (adult) (pediatric): Secondary | ICD-10-CM | POA: Diagnosis not present

## 2022-07-14 HISTORY — PX: ATRIAL FIBRILLATION ABLATION: EP1191

## 2022-07-14 LAB — GLUCOSE, CAPILLARY
Glucose-Capillary: 144 mg/dL — ABNORMAL HIGH (ref 70–99)
Glucose-Capillary: 168 mg/dL — ABNORMAL HIGH (ref 70–99)

## 2022-07-14 LAB — POCT ACTIVATED CLOTTING TIME: Activated Clotting Time: 287 seconds

## 2022-07-14 SURGERY — ATRIAL FIBRILLATION ABLATION
Anesthesia: General

## 2022-07-14 MED ORDER — HEPARIN SODIUM (PORCINE) 1000 UNIT/ML IJ SOLN
INTRAMUSCULAR | Status: DC | PRN
  Administered 2022-07-14: 3000 [IU] via INTRAVENOUS
  Administered 2022-07-14: 15000 [IU] via INTRAVENOUS

## 2022-07-14 MED ORDER — MIDAZOLAM HCL 2 MG/2ML IJ SOLN
INTRAMUSCULAR | Status: AC
  Filled 2022-07-14: qty 2

## 2022-07-14 MED ORDER — ROCURONIUM BROMIDE 10 MG/ML (PF) SYRINGE
PREFILLED_SYRINGE | INTRAVENOUS | Status: DC | PRN
  Administered 2022-07-14: 30 mg via INTRAVENOUS
  Administered 2022-07-14: 10 mg via INTRAVENOUS

## 2022-07-14 MED ORDER — SUGAMMADEX SODIUM 200 MG/2ML IV SOLN
INTRAVENOUS | Status: DC | PRN
  Administered 2022-07-14: 200 mg via INTRAVENOUS

## 2022-07-14 MED ORDER — DOBUTAMINE INFUSION FOR EP/ECHO/NUC (1000 MCG/ML)
INTRAVENOUS | Status: AC
  Filled 2022-07-14: qty 250

## 2022-07-14 MED ORDER — PROPOFOL 10 MG/ML IV BOLUS
INTRAVENOUS | Status: AC
  Filled 2022-07-14: qty 20

## 2022-07-14 MED ORDER — ROCURONIUM BROMIDE 10 MG/ML (PF) SYRINGE
PREFILLED_SYRINGE | INTRAVENOUS | Status: AC
  Filled 2022-07-14: qty 10

## 2022-07-14 MED ORDER — DEXAMETHASONE SODIUM PHOSPHATE 10 MG/ML IJ SOLN
INTRAMUSCULAR | Status: DC | PRN
  Administered 2022-07-14: 5 mg via INTRAVENOUS

## 2022-07-14 MED ORDER — FENTANYL CITRATE (PF) 250 MCG/5ML IJ SOLN
INTRAMUSCULAR | Status: AC
  Filled 2022-07-14: qty 5

## 2022-07-14 MED ORDER — SODIUM CHLORIDE 0.9 % IV SOLN: INTRAVENOUS | Status: DC

## 2022-07-14 MED ORDER — PHENYLEPHRINE 80 MCG/ML (10ML) SYRINGE FOR IV PUSH (FOR BLOOD PRESSURE SUPPORT)
PREFILLED_SYRINGE | INTRAVENOUS | Status: DC | PRN
  Administered 2022-07-14: 160 ug via INTRAVENOUS
  Administered 2022-07-14 (×2): 80 ug via INTRAVENOUS

## 2022-07-14 MED ORDER — SUCCINYLCHOLINE CHLORIDE 200 MG/10ML IV SOSY
PREFILLED_SYRINGE | INTRAVENOUS | Status: DC | PRN
  Administered 2022-07-14: 100 mg via INTRAVENOUS

## 2022-07-14 MED ORDER — PROPOFOL 10 MG/ML IV BOLUS
INTRAVENOUS | Status: DC | PRN
  Administered 2022-07-14: 50 mg via INTRAVENOUS
  Administered 2022-07-14: 30 mg via INTRAVENOUS
  Administered 2022-07-14: 120 mg via INTRAVENOUS

## 2022-07-14 MED ORDER — DOBUTAMINE INFUSION FOR EP/ECHO/NUC (1000 MCG/ML)
INTRAVENOUS | Status: DC | PRN
  Administered 2022-07-14: 20 ug/kg/min via INTRAVENOUS

## 2022-07-14 MED ORDER — HEPARIN (PORCINE) IN NACL 1000-0.9 UT/500ML-% IV SOLN
INTRAVENOUS | Status: DC | PRN
  Administered 2022-07-14 (×4): 500 mL

## 2022-07-14 MED ORDER — ONDANSETRON HCL 4 MG/2ML IJ SOLN: 4.0000 mg | Freq: Four times a day (QID) | INTRAMUSCULAR | Status: DC | PRN

## 2022-07-14 MED ORDER — PROTAMINE SULFATE 10 MG/ML IV SOLN
INTRAVENOUS | Status: DC | PRN
  Administered 2022-07-14: 40 mg via INTRAVENOUS

## 2022-07-14 MED ORDER — FENTANYL CITRATE (PF) 250 MCG/5ML IJ SOLN
INTRAMUSCULAR | Status: DC | PRN
  Administered 2022-07-14: 100 ug via INTRAVENOUS
  Administered 2022-07-14: 50 ug via INTRAVENOUS

## 2022-07-14 MED ORDER — HEPARIN SODIUM (PORCINE) 1000 UNIT/ML IJ SOLN
INTRAMUSCULAR | Status: DC | PRN
  Administered 2022-07-14: 1000 [IU] via INTRAVENOUS

## 2022-07-14 MED ORDER — ACETAMINOPHEN 325 MG PO TABS: 650.0000 mg | ORAL_TABLET | ORAL | Status: DC | PRN

## 2022-07-14 MED ORDER — MIDAZOLAM HCL 2 MG/2ML IJ SOLN
INTRAMUSCULAR | Status: DC | PRN
  Administered 2022-07-14: 2 mg via INTRAVENOUS

## 2022-07-14 MED ORDER — SODIUM CHLORIDE 0.9 % IV SOLN: 250.0000 mL | INTRAVENOUS | Status: DC | PRN

## 2022-07-14 MED ORDER — SODIUM CHLORIDE 0.9% FLUSH: 3.0000 mL | INTRAVENOUS | Status: DC | PRN

## 2022-07-14 MED ORDER — ONDANSETRON HCL 4 MG/2ML IJ SOLN
INTRAMUSCULAR | Status: DC | PRN
  Administered 2022-07-14: 4 mg via INTRAVENOUS

## 2022-07-14 SURGICAL SUPPLY — 22 items
BAG SNAP BAND KOVER 36X36 (MISCELLANEOUS) IMPLANT
CATH 8FR REPROCESSED SOUNDSTAR (CATHETERS) ×1 IMPLANT
CATH 8FR SOUNDSTAR REPROCESSED (CATHETERS) IMPLANT
CATH ABLAT QDOT MICRO BI TC DF (CATHETERS) IMPLANT
CATH OCTARAY 2.0 F 3-3-3-3-3 (CATHETERS) IMPLANT
CATH PIGTAIL STEERABLE D1 8.7 (WIRE) IMPLANT
CATH S-M CIRCA TEMP PROBE (CATHETERS) IMPLANT
CATH WEBSTER BI DIR CS D-F CRV (CATHETERS) IMPLANT
CLOSURE PERCLOSE PROSTYLE (VASCULAR PRODUCTS) IMPLANT
COVER SWIFTLINK CONNECTOR (BAG) ×1 IMPLANT
DILATOR VESSEL 10FR 20CM (INTRODUCER) IMPLANT
MAT PREVALON FULL STRYKER (MISCELLANEOUS) IMPLANT
PACK EP LATEX FREE (CUSTOM PROCEDURE TRAY) ×1
PACK EP LF (CUSTOM PROCEDURE TRAY) ×1 IMPLANT
PAD DEFIB RADIO PHYSIO CONN (PAD) ×1 IMPLANT
PATCH CARTO3 (PAD) IMPLANT
SHEATH CARTO VIZIGO SM CVD (SHEATH) IMPLANT
SHEATH PINNACLE 7F 10CM (SHEATH) IMPLANT
SHEATH PINNACLE 8F 10CM (SHEATH) IMPLANT
SHEATH PINNACLE 9F 10CM (SHEATH) IMPLANT
SHEATH PROBE COVER 6X72 (BAG) IMPLANT
TUBING SMART ABLATE COOLFLOW (TUBING) IMPLANT

## 2022-07-14 NOTE — Interval H&P Note (Signed)
History and Physical Interval Note:  07/14/2022 7:23 AM  Dennis King  has presented today for surgery, with the diagnosis of atrial fibrillation.  The various methods of treatment have been discussed with the patient and family. After consideration of risks, benefits and other options for treatment, the patient has consented to  Procedure(s): ATRIAL FIBRILLATION ABLATION (N/A) as a surgical intervention.  The patient's history has been reviewed, patient examined, no change in status, stable for surgery.  I have reviewed the patient's chart and labs.  Questions were answered to the patient's satisfaction.     Hrishikesh Hoeg [employer]

## 2022-07-14 NOTE — Discharge Instructions (Addendum)
Post procedure care instructions No driving for 4 days. No lifting over 5 lbs for 1 week. No vigorous or sexual activity for 1 week. You may return to work/your usual activities on 07/22/22. Keep procedure site clean & dry. If you notice increased pain, swelling, bleeding or pus, call/return!  You may shower after 24 hours, but no soaking in baths/hot tubs/pools for 1 week.   You have an appointment set up with the [facility].  Multiple studies have shown that being followed by a dedicated atrial fibrillation clinic in addition to the standard care you receive from your other physicians improves health. We believe that enrollment in the atrial fibrillation clinic will allow [place] to better care for you.   The phone number to the [facility] is [phone]. The clinic is staffed Monday through Friday from 8:30am to 5pm.  Directions: The clinic is located in the [facility], [place] the hospital at the MAIN ENTRANCE "A", use [address] to the 6th floor.  Registration desk to the right of elevators on 6th floor  If you have any trouble locating the clinic, please don't hesitate to call [phone].      Cardiac Ablation, Care After  This sheet gives you information about how to care for yourself after your procedure. Your health care provider may also give you more specific instructions. If you have problems or questions, contact your health care provider. What can I expect after the procedure? After the procedure, it is common to have: Bruising around your puncture site. Tenderness around your puncture site. Skipped heartbeats. If you had an atrial fibrillation ablation, you may have atrial fibrillation during the first several months after your procedure.  Tiredness (fatigue).  Follow these instructions at home: Puncture site care  Follow instructions from your health care provider about how to take care of your puncture site. Make  sure you: If present, leave stitches (sutures), skin glue, or adhesive strips in place. These skin closures may need to stay in place for up to 2 weeks. If adhesive strip edges start to loosen and curl up, you may trim the loose edges. Do not remove adhesive strips completely unless your health care provider tells you to do that. If a large square bandage is present, this may be removed 24 hours after surgery.  Check your puncture site every day for signs of infection. Check for: Redness, swelling, or pain. Fluid or blood. If your puncture site starts to bleed, lie down on your back, apply firm pressure to the area, and contact your health care provider. Warmth. Pus or a bad smell. A pea or small marble sized lump at the site is normal and can take up to three months to resolve.  Driving Do not drive for at least 4 days after your procedure or however long your health care provider recommends. (Do not resume driving if you have previously been instructed not to drive for other health reasons.) Do not drive or use heavy machinery while taking prescription pain medicine. Activity Avoid activities that take a lot of effort for at least 7 days after your procedure. Do not lift anything that is heavier than 5 lb (4.5 kg) for one week.  No sexual activity for 1 week.  Return to your normal activities as told by your health care provider. Ask your health care provider what activities are safe for you. General instructions Take over-the-counter and prescription medicines only as told by your health care provider.  Do not use any products that contain nicotine or tobacco, such as cigarettes and e-cigarettes. If you need help quitting, ask your health care provider. You may shower after 24 hours, but Do not take baths, swim, or use a hot tub for 1 week.  Do not drink alcohol for 24 hours after your procedure. Keep all follow-up visits as told by your health care provider. This is important. Contact a  health care provider if: You have redness, mild swelling, or pain around your puncture site. You have fluid or blood coming from your puncture site that stops after applying firm pressure to the area. Your puncture site feels warm to the touch. You have pus or a bad smell coming from your puncture site. You have a fever. You have chest pain or discomfort that spreads to your neck, jaw, or arm. You have chest pain that is worse with lying on your back or taking a deep breath. You are sweating a lot. You feel nauseous. You have a fast or irregular heartbeat. You have shortness of breath. You are dizzy or light-headed and feel the need to lie down. You have pain or numbness in the arm or leg closest to your puncture site. Get help right away if: Your puncture site suddenly swells. Your puncture site is bleeding and the bleeding does not stop after applying firm pressure to the area. These symptoms may represent a serious problem that is an emergency. Do not wait to see if the symptoms will go away. Get medical help right away. Call your local emergency services (911 in the [place] Do not drive yourself to the hospital. Summary After the procedure, it is normal to have bruising and tenderness at the puncture site in your groin, neck, or forearm. Check your puncture site every day for signs of infection. Get help right away if your puncture site is bleeding and the bleeding does not stop after applying firm pressure to the area. This is a medical emergency. This information is not intended to replace advice given to you by your health care provider. Make sure you discuss any questions you have with your health care provider.

## 2022-07-14 NOTE — Anesthesia Procedure Notes (Signed)
Procedure Name: Intubation Date/Time: 07/14/2022 9:00 AM  Performed by: Elvin So, CRNAPre-anesthesia Checklist: Patient identified, Emergency Drugs available, Suction available and Patient being monitored Patient Re-evaluated:Patient Re-evaluated prior to induction Oxygen Delivery Method: Circle System Utilized Preoxygenation: Pre-oxygenation with 100% oxygen Induction Type: IV induction Ventilation: Mask ventilation without difficulty Laryngoscope Size: Mac and 4 Grade View: Grade III Tube type: Oral Tube size: 7.5 mm Number of attempts: 1 Airway Equipment and Method: Stylet and Oral airway Placement Confirmation: ETT inserted through vocal cords under direct vision, positive ETCO2 and breath sounds checked- equal and bilateral Secured at: 22 cm Tube secured with: Tape Dental Injury: Injury to lip  Comments: Mild pinching of lip against teeth.

## 2022-07-14 NOTE — Transfer of Care (Signed)
Immediate Anesthesia Transfer of Care Note  Patient: Dennis King  Procedure(s) Performed: ATRIAL FIBRILLATION ABLATION  Patient Location: PACU  Anesthesia Type:General  Level of Consciousness: awake and patient cooperative  Airway & Oxygen Therapy: Patient Spontanous Breathing and Patient connected to face mask oxygen  Post-op Assessment: Report given to RN, Post -op Vital signs reviewed and stable, and Patient moving all extremities  Post vital signs: Reviewed and stable  Last Vitals:  Vitals Value Taken Time  BP 141/69 07/14/22 1045  Temp    Pulse 76 07/14/22 1045  Resp 14 07/14/22 1045  SpO2 96 07/14/22 1045    Last Pain:  Vitals:   07/14/22 0637  TempSrc: Oral  PainSc: 0-No pain         Complications: There were no known notable events for this encounter.

## 2022-07-14 NOTE — Anesthesia Postprocedure Evaluation (Signed)
Anesthesia Post Note  Patient: Dennis King  Procedure(s) Performed: ATRIAL FIBRILLATION ABLATION     Patient location during evaluation: PACU Anesthesia Type: General Level of consciousness: awake and alert and oriented Pain management: pain level controlled Vital Signs Assessment: post-procedure vital signs reviewed and stable Respiratory status: spontaneous breathing, nonlabored ventilation and respiratory function stable Cardiovascular status: blood pressure returned to baseline and stable Postop Assessment: no apparent nausea or vomiting Anesthetic complications: no   There were no known notable events for this encounter.  Last Vitals:  Vitals:   07/14/22 1116 07/14/22 1120  BP:  (!) 151/69  Pulse:  75  Resp:  11  Temp: 36.7 C   SpO2:  100%    Last Pain:  Vitals:   07/14/22 1116  TempSrc: Temporal  PainSc:                  Kepler Mccabe A.

## 2022-07-15 ENCOUNTER — Encounter: Payer: Self-pay | Admitting: Cardiology

## 2022-07-15 ENCOUNTER — Encounter (HOSPITAL_COMMUNITY): Payer: Self-pay | Admitting: Cardiology

## 2022-08-03 ENCOUNTER — Telehealth: Payer: Self-pay | Admitting: Cardiology

## 2022-08-03 DIAGNOSIS — E7800 Pure hypercholesterolemia, unspecified: Secondary | ICD-10-CM

## 2022-08-03 MED ORDER — REPATHA SURECLICK 140 MG/ML ~~LOC~~ SOAJ: SUBCUTANEOUS | 3 refills | Status: DC

## 2022-08-03 NOTE — Telephone Encounter (Signed)
 *  STAT* If patient is at the pharmacy, call can be transferred to refill team.   1. Which medications need to be refilled? (please list name of each medication and dose if known) REPATHA SURECLICK 762 MG/ML SOAJ   2. Which pharmacy/location (including street and city if local pharmacy) is medication to be sent to? [id number] - [place], [place] - [address]   3. Do they need a 30 day or 90 day supply? 90 days

## 2022-08-03 NOTE — Telephone Encounter (Signed)
Refill sent in

## 2022-08-04 ENCOUNTER — Other Ambulatory Visit: Payer: Self-pay | Admitting: Cardiology

## 2022-08-11 ENCOUNTER — Inpatient Hospital Stay (HOSPITAL_COMMUNITY): Admission: RE | Admit: 2022-08-11 | Payer: PPO | Source: Ambulatory Visit | Admitting: Physician Assistant

## 2022-08-11 ENCOUNTER — Encounter (HOSPITAL_COMMUNITY): Payer: Self-pay

## 2022-08-16 ENCOUNTER — Ambulatory Visit: Payer: PPO | Admitting: Cardiology

## 2022-08-26 ENCOUNTER — Ambulatory Visit (HOSPITAL_COMMUNITY)
Admission: RE | Admit: 2022-08-26 | Discharge: 2022-08-26 | Disposition: A | Discharge status: Home / Self Care | Payer: PPO | Source: Ambulatory Visit | Attending: Internal Medicine | Admitting: Internal Medicine

## 2022-08-26 VITALS — BP 150/72 | HR 77 | Ht 71.0 in | Wt 237.6 lb

## 2022-08-26 DIAGNOSIS — I4819 Other persistent atrial fibrillation: Secondary | ICD-10-CM | POA: Diagnosis not present

## 2022-08-26 DIAGNOSIS — E119 Type 2 diabetes mellitus without complications: Secondary | ICD-10-CM | POA: Diagnosis not present

## 2022-08-26 DIAGNOSIS — D6869 Other thrombophilia: Secondary | ICD-10-CM | POA: Diagnosis not present

## 2022-08-26 DIAGNOSIS — Z7901 Long term (current) use of anticoagulants: Secondary | ICD-10-CM | POA: Diagnosis not present

## 2022-08-26 DIAGNOSIS — I10 Essential (primary) hypertension: Secondary | ICD-10-CM | POA: Diagnosis not present

## 2022-08-26 LAB — BASIC METABOLIC PANEL
Anion gap: 9 (ref 5–15)
BUN: 21 mg/dL (ref 8–23)
CO2: 28 mmol/L (ref 22–32)
Calcium: 9.5 mg/dL (ref 8.9–10.3)
Chloride: 102 mmol/L (ref 98–111)
Creatinine, Ser: 1.07 mg/dL (ref 0.61–1.24)
GFR, Estimated: >60 mL/min (ref >60)
Glucose, Bld: 201 mg/dL — ABNORMAL HIGH (ref 70–99)
Potassium: 4.6 mmol/L (ref 3.5–5.1)
Sodium: 139 mmol/L (ref 135–145)

## 2022-08-26 LAB — MAGNESIUM: Magnesium: 2.5 mg/dL — ABNORMAL HIGH (ref 1.7–2.4)

## 2022-08-26 NOTE — Progress Notes (Signed)
Primary Care Physician: Aura Dials, MD Referring Physician: Dr. 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 Rayann Heman. He stopped Multaq and HCTZ on 9/28. He called the office yesterday as he was having some shortness of breath and fluid retention. He was given lasix/K+ yesterday and again this am and this helped with his symptoms.  He has not missed any anticoagulation for at least 3 weeks  and no benadryl use. He has cut back but continues with daily alcohol and caffeine use.   F/u in afib clinic, 03/25/21. He is one week out from [place] load. He had been feeling so much better and was surprised that he was back in afib this am. He was placed on spironolactone in the hospital and has lost 10 lbs. Is feeling much improved from  abdominal swelling and exertional shortness of breath.   F/u in afib clinic, 02/09/22 for Tikosyn surveillance. He reports having afib about every 2 weeks lasing around 12 hours. He has had 2 ablations in the past with Dr. Rayann Heman. He is complaint with tikosyn.  We discussed today establishing with another EP in Dr. Jackalyn Lombard absence to see if a 3 rd ablation would be necessary and he is interested to proceed.   Follow up in the AF clinic 06/15/22. Patient reports that he has an episode of palpitations and fatigue once every 1-2 weeks. They typically last 2-3 hours but can be up to 12 hours. He was started on lisinopril HCTZ 3 days ago by his PCP. He is scheduled for an afib ablation 07/14/22.  Follow up in the AF clinic 08/26/22. Patient is now s/p ablation by Dr. Curt Bears on 07/14/22. He is doing great overall. He is in [place] today and has not had any episodes of Afib since ablation. No chest pain, shortness of breath, or trouble swallowing. Leg sites healed without issue. He has been compliant with  anticoagulation and no bleeding concerns. He is interested in coming off Tikosyn and will discuss with Dr. Curt Bears at 3 month f/u.   Today, he denies symptoms of chest pain, shortness of breath, orthopnea, PND, lower extremity edema, dizziness, presyncope, syncope, or neurologic sequela. The patient is tolerating medications without difficulties and is otherwise without complaint today.   Past Medical History:  Diagnosis Date   Arthritis    Coronary artery disease    past post circumflex obtuse marginal branch cutting balloon atherectomy by Dr. Irish Lack   Family history of heart disease    Hyperlipidemia    Hypertension    Obesity    Obstructive sleep apnea    on C Pap   Paroxysmal A-fib ([place])    Type 2 diabetes mellitus ([place])    Past Surgical History:  Procedure Laterality Date   APPENDECTOMY     ATRIAL FIBRILLATION ABLATION N/A 05/06/2020   Procedure: ATRIAL FIBRILLATION ABLATION;  Surgeon: Thompson Grayer, MD;  Location: [facility];  Service: Cardiovascular;  Laterality: N/A;   ATRIAL FIBRILLATION ABLATION N/A 07/14/2022   Procedure: ATRIAL FIBRILLATION ABLATION;  Surgeon: Constance Haw, MD;  Location: [facility];  Service: Cardiovascular;  Laterality: N/A;   CARDIAC CATHETERIZATION N/A 10/22/2014   Procedure: Left Heart Cath and Coronary Angiography;  Surgeon: Jettie Booze, MD;  Location: [facility];  Service:  Cardiovascular;  Laterality: N/A;   CARDIAC CATHETERIZATION Right 10/22/2014   Procedure: Coronary Stent Intervention;  Surgeon: Jettie Booze, MD;  Location: [facility];  Service: Cardiovascular;  Laterality: Right;   ELECTROPHYSIOLOGIC STUDY N/A 01/31/2015   Procedure: Atrial Fibrillation Ablation;  Surgeon: Thompson Grayer, MD;  Location: [facility];  Service: Cardiovascular;  Laterality: N/A;   TEE WITHOUT CARDIOVERSION N/A 01/30/2015   Procedure: TRANSESOPHAGEAL ECHOCARDIOGRAM (TEE);  Surgeon: Josue Hector, MD;   Location: [facility];  Service: Cardiovascular;  Laterality: N/A;    Current Outpatient Medications  Medication Sig Dispense Refill   apixaban (ELIQUIS) 5 MG TABS tablet TAKE ONE TABLET BY MOUTH TWICE DAILY 180 tablet 1   carvedilol (COREG) 25 MG tablet Take 25 mg by mouth 2 (two) times daily with a meal.      cholecalciferol (VITAMIN D) 25 MCG (1000 UNIT) tablet Take 1,000 Units by mouth every evening.     Continuous Blood Gluc Receiver (FREESTYLE LIBRE 2 READER) DEVI Use as directed to monitor blood glucose     Continuous Blood Gluc Sensor (FREESTYLE LIBRE 2 SENSOR) MISC Use as directed every 14 days     diltiazem (CARDIZEM) 30 MG tablet TAKE ONE TABLET EVERY FOUR HOURS AS NEEDED FOR HEART RATE >100 30 tablet 0   dofetilide (TIKOSYN) 500 MCG capsule TAKE 1 CAPSULE BY MOUTH TWICE A DAY 180 capsule 1   Evolocumab (REPATHA SURECLICK) [id number] MG/ML SOAJ INJECT ONE PEN INTO THE SKIN EVERY 14 DAYS 6 mL 3   INVOKANA 300 MG TABS tablet Take 300 mg by mouth daily.  4   Krill Oil 1000 MG CAPS Take 3,000 capsules by mouth every morning.     lisinopril (ZESTRIL) 20 MG tablet Take 1 tablet (20 mg total) by mouth daily. 30 tablet 6   metFORMIN (GLUCOPHAGE) 1000 MG tablet Take 1,000 mg by mouth 2 (two) times daily with a meal.      Multiple Vitamin (MULTIVITAMIN WITH MINERALS) TABS tablet Take 1 tablet by mouth daily.     nitroGLYCERIN (NITROSTAT) 0.4 MG SL tablet Place 1 tablet (0.4 mg total) under the tongue every 5 (five) minutes as needed for chest pain. 25 tablet 4   NON FORMULARY 1 Dose by Other route at bedtime. Cpap 8cm H2O     NOVOLOG MIX 70/30 (70-30) 100 UNIT/ML injection Inject 10-20 Units into the skin in the morning and at bedtime. Sliding scale  4   OZEMPIC, 0.25 OR 0.5 MG/DOSE, 2 MG/3ML SOPN Inject 1 mg into the skin once a week.     sildenafil (REVATIO) 20 MG tablet Take 20 mg by mouth daily as needed (ED). Taking 2-5 tablets by mouth as needed     No current facility-administered  medications for this encounter.    Allergies  Allergen Reactions   Statins Other (See Comments)    Specifically Atorvastatin and Crestor - myalgia    Social History   Socioeconomic History   Marital status: Married    Spouse name: Not on file   Number of children: 2   Years of education: Not on file   Highest education level: Not on file  Occupational History   Not on file  Tobacco Use   Smoking status: Never   Smokeless tobacco: Never   Tobacco comments:    Never smoke 06/15/22  Vaping Use   Vaping Use: Never used  Substance and Sexual Activity   Alcohol use: Yes    Alcohol/week: 7.0 - 14.0  standard drinks of alcohol    Types: 7 - 14 Glasses of wine per week    Comment: 1-2 glass of wine weekly 06/15/22   Drug use: No   Sexual activity: Not on file  Other Topics Concern   Not on file  Social History Narrative   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  Lives in [place].   Social Determinants of Health   Financial Resource Strain: Not on file  Food Insecurity: Not on file  Transportation Needs: Not on file  Physical Activity: Not on file  Stress: Not on file  Social Connections: Not on file  Intimate Partner Violence: Not on file    Family History  Problem Relation Age of Onset   Heart attack Father 66       Died age 80   Atrial fibrillation Mother     ROS- All systems are reviewed and negative except as per the HPI above  Physical Exam: Vitals:   08/26/22 0829  Weight: 107.8 kg  Height: 5\' 11"  (1.803 m)    Wt Readings from Last 3 Encounters:  08/26/22 107.8 kg  07/14/22 104.3 kg  06/15/22 105.7 kg    Labs: Lab Results  Component Value Date   NA 136 06/15/2022   K 3.8 06/15/2022   CL 99 06/15/2022   CO2 27 06/15/2022   GLUCOSE 150 (H) 06/15/2022   BUN 25 (H) 06/15/2022   CREATININE 1.15 06/15/2022   CALCIUM 9.8 06/15/2022   MG 2.4 06/15/2022   Lab Results  Component Value Date   INR 1.12 10/22/2014   Lab Results   Component Value Date   CHOL 199 07/05/2017   HDL 53 07/05/2017   LDLCALC 121 (H) 07/05/2017   TRIG 123 07/05/2017    GEN- The patient is well appearing, alert and oriented x 3 today.   Head- normocephalic, atraumatic Eyes-  Sclera clear, conjunctiva pink Ears- hearing intact Oropharynx- clear Neck- supple, no JVP Lymph- no cervical lymphadenopathy Lungs- Clear to ausculation bilaterally, normal work of breathing Heart- Regular rate and rhythm, no murmurs, rubs or gallops, PMI not laterally displaced GI- soft, NT, ND, + BS Extremities- no clubbing, cyanosis, or edema MS- no significant deformity or atrophy Skin- no rash or lesion Psych- euthymic mood, full affect Neuro- strength and sensation are intact   EKG-  SR vs junctional rhythm Vent. rate 77 BPM PR interval 160 ms QRS duration 92 ms QT/QTcB 408/461 ms   Echo 04/07/20 1. Left ventricular ejection fraction, by estimation, is 50 to 55%. The  left ventricle has low normal function. The left ventricle has no regional wall motion abnormalities. Left ventricular diastolic function could not be evaluated.   2. Right ventricular systolic function was not well visualized. The right ventricular size is normal. Tricuspid regurgitation signal is inadequate for assessing PA pressure.   3. The mitral valve is normal in structure. Trivial mitral valve  regurgitation. No evidence of mitral stenosis.   4. The aortic valve is calcified. There is mild calcification of the  aortic valve. There is moderate thickening of the aortic valve. Aortic  valve regurgitation is not visualized. Mild to moderate aortic valve  sclerosis/calcification is present, without  any evidence of aortic stenosis.   5. The inferior vena cava is dilated in size with >50% respiratory  variability, suggesting right atrial pressure of 8 mmHg.    CHA2DS2-VASc Score = 3  The patient's score is based upon: CHF History: 0 HTN History: 1 Diabetes  History:  1 Stroke History: 0 Vascular Disease History: 0 Age Score: 1 Gender Score: 0      ASSESSMENT AND PLAN: 1. Persistent Atrial Fibrillation (ICD10:  I48.19) The patient's CHA2DS2-VASc score is 3, indicating a 3.2% annual risk of stroke.   S/p ablation x 2 S/p ablation by Dr Curt Bears on 07/14/22  He is in [place] today and doing well overall.  Continue dofetilide 500 mcg BID. He will discuss with Dr. Curt Bears whether can come off AAD.   Bmet and mag level today. Previous K was 3.8.  Continue carvedilol 25 mg BID Continue Eliquis 5 mg BID  2. Secondary Hypercoagulable State (ICD10:  D68.69) The patient is at significant risk for stroke/thromboembolism based upon his CHA2DS2-VASc Score of 3.  Continue Apixaban (Eliquis).   Continue anticoagulation without missed doses in the post ablation period.  3. HTN Elevated today; advised to trend at home.    Follow up with Dr. Curt Bears as scheduled.   Emily Filbert, PA-C [facility] [address] [place],  [address] [phone]

## 2022-10-11 ENCOUNTER — Encounter: Payer: Self-pay | Admitting: Cardiology

## 2022-10-11 ENCOUNTER — Ambulatory Visit: Payer: PPO | Attending: Cardiology | Admitting: Cardiology

## 2022-10-11 VITALS — BP 146/90 | HR 74 | Ht 71.0 in | Wt 231.4 lb

## 2022-10-11 DIAGNOSIS — D6869 Other thrombophilia: Secondary | ICD-10-CM | POA: Diagnosis not present

## 2022-10-11 DIAGNOSIS — Z79899 Other long term (current) drug therapy: Secondary | ICD-10-CM

## 2022-10-11 DIAGNOSIS — I4819 Other persistent atrial fibrillation: Secondary | ICD-10-CM | POA: Diagnosis not present

## 2022-10-11 DIAGNOSIS — I10 Essential (primary) hypertension: Secondary | ICD-10-CM | POA: Diagnosis not present

## 2022-10-11 NOTE — Progress Notes (Signed)
Electrophysiology Office Note   Date:  10/11/2022   ID:  Dennis King, Dennis King 28-Jul-1950, MRN [id number]  PCP:  Henrine Screws, MD  Cardiologist:   Primary Electrophysiologist:  Aubryana Vittorio Jorja Loa, MD    Chief Complaint: AF   History of Present Illness: Dennis King is a 72 y.o. male who is being seen today for the evaluation of AF at the request of Henrine Screws, MD. Presenting today for electrophysiology evaluation.  He has a history significant for persistent atrial fibrillation, hypertension, OSA, type 2 diabetes, coronary artery disease.  He is currently on dofetilide.  He is post ablation in 2016 and 2021.  He is unfortunately continued to have episodes of atrial fibrillation.  He is now post repeat ablation 07/14/2022.  Today, denies symptoms of palpitations, chest pain, shortness of breath, orthopnea, PND, lower extremity edema, claudication, dizziness, presyncope, syncope, bleeding, or neurologic sequela. The patient is tolerating medications without difficulties.  Today he is feeling well.  Since his ablation, he has noted no further episodes of atrial fibrillation.  He is able to do all of his daily activities.    Past Medical History:  Diagnosis Date   Arthritis    Coronary artery disease    past post circumflex obtuse marginal branch cutting balloon atherectomy by Dr. [name]   Family history of heart disease    Hyperlipidemia    Hypertension    Obesity    Obstructive sleep apnea    on C Pap   Paroxysmal A-fib (HCC)    Type 2 diabetes mellitus (HCC)    Past Surgical History:  Procedure Laterality Date   APPENDECTOMY     ATRIAL FIBRILLATION ABLATION N/A 05/06/2020   Procedure: ATRIAL FIBRILLATION ABLATION;  Surgeon: [name], MD;  Location: [facility] INVASIVE CV LAB;  Service: Cardiovascular;  Laterality: N/A;   ATRIAL FIBRILLATION ABLATION N/A 07/14/2022   Procedure: ATRIAL FIBRILLATION ABLATION;  Surgeon: Regan Lemming, MD;  Location: [facility] INVASIVE  CV LAB;  Service: Cardiovascular;  Laterality: N/A;   CARDIAC CATHETERIZATION N/A 10/22/2014   Procedure: Left Heart Cath and Coronary Angiography;  Surgeon: Corky Crafts, MD;  Location: [facility] INVASIVE CV LAB;  Service: Cardiovascular;  Laterality: N/A;   CARDIAC CATHETERIZATION Right 10/22/2014   Procedure: Coronary Stent Intervention;  Surgeon: Corky Crafts, MD;  Location: [facility] INVASIVE CV LAB;  Service: Cardiovascular;  Laterality: Right;   ELECTROPHYSIOLOGIC STUDY N/A 01/31/2015   Procedure: Atrial Fibrillation Ablation;  Surgeon: [name], MD;  Location: [facility] INVASIVE CV LAB;  Service: Cardiovascular;  Laterality: N/A;   TEE WITHOUT CARDIOVERSION N/A 01/30/2015   Procedure: TRANSESOPHAGEAL ECHOCARDIOGRAM (TEE);  Surgeon: Wendall Stade, MD;  Location: [facility] ENDOSCOPY;  Service: Cardiovascular;  Laterality: N/A;     Current Outpatient Medications  Medication Sig Dispense Refill   apixaban (ELIQUIS) 5 MG TABS tablet TAKE ONE TABLET BY MOUTH TWICE DAILY 180 tablet 1   carvedilol (COREG) 25 MG tablet Take 25 mg by mouth 2 (two) times daily with a meal.      cholecalciferol (VITAMIN D) 25 MCG (1000 UNIT) tablet Take 1,000 Units by mouth every evening.     Continuous Blood Gluc Receiver (FREESTYLE LIBRE 2 READER) DEVI Use as directed to monitor blood glucose     Continuous Blood Gluc Sensor (FREESTYLE LIBRE 2 SENSOR) MISC Use as directed every 14 days     diltiazem (CARDIZEM) 30 MG tablet TAKE ONE TABLET EVERY FOUR HOURS AS NEEDED FOR HEART RATE >100 30 tablet 0  dofetilide (TIKOSYN) 500 MCG capsule TAKE 1 CAPSULE BY MOUTH TWICE A DAY 180 capsule 1   Evolocumab (REPATHA SURECLICK) 140 MG/ML SOAJ INJECT ONE PEN INTO THE SKIN EVERY 14 DAYS 6 mL 3   INVOKANA 300 MG TABS tablet Take 300 mg by mouth daily.  4   Krill Oil 1000 MG CAPS Take 3,000 capsules by mouth every morning.     lisinopril (ZESTRIL) 20 MG tablet Take 1 tablet (20 mg total) by mouth daily. 30 tablet 6   metFORMIN (GLUCOPHAGE)  1000 MG tablet Take 1,000 mg by mouth 2 (two) times daily with a meal.      Multiple Vitamin (MULTIVITAMIN WITH MINERALS) TABS tablet Take 1 tablet by mouth daily.     nitroGLYCERIN (NITROSTAT) 0.4 MG SL tablet Place 1 tablet (0.4 mg total) under the tongue every 5 (five) minutes as needed for chest pain. 25 tablet 4   NON FORMULARY 1 Dose by Other route at bedtime. Cpap 8cm H2O     NOVOLOG MIX 70/30 (70-30) 100 UNIT/ML injection Inject 10-20 Units into the skin in the morning and at bedtime. Sliding scale  4   OZEMPIC, 0.25 OR 0.5 MG/DOSE, 2 MG/3ML SOPN Inject 1 mg into the skin once a week.     sildenafil (REVATIO) 20 MG tablet Take 20 mg by mouth daily as needed (ED). Taking 2-5 tablets by mouth as needed     No current facility-administered medications for this visit.    Allergies:   Statins   Social History:  The patient  reports that he has never smoked. He has never used smokeless tobacco. He reports current alcohol use of about 7.0 - 14.0 standard drinks of alcohol per week. He reports that he does not use drugs.   Family History:  The patient's family history includes Atrial fibrillation in his mother; Heart attack (age of onset: 26) in his father.   ROS:  Please see the history of present illness.   Otherwise, review of systems is positive for none.   All other systems are reviewed and negative.   PHYSICAL EXAM: VS:  BP (!) 146/90   Pulse 74   Ht 5\' 11"  (1.803 m)   Wt 231 lb 6.4 oz (105 kg)   SpO2 97%   BMI 32.27 kg/m  , BMI Body mass index is 32.27 kg/m. GEN: Well nourished, well developed, in no acute distress  HEENT: normal  Neck: no JVD, carotid bruits, or masses Cardiac: RRR; no murmurs, rubs, or gallops,no edema  Respiratory:  clear to auscultation bilaterally, normal work of breathing GI: soft, nontender, nondistended, + BS MS: no deformity or atrophy  Skin: warm and dry Neuro:  Strength and sensation are intact Psych: euthymic mood, full affect  EKG:  EKG is  ordered today. Personal review of the ekg ordered shows sinus rhythm   Recent Labs: 06/15/2022: Hemoglobin 14.3; Platelets 257 08/26/2022: BUN 21; Creatinine, Ser 1.07; Magnesium 2.5; Potassium 4.6; Sodium 139    Lipid Panel     Component Value Date/Time   CHOL 199 07/05/2017 0947   TRIG 123 07/05/2017 0947   HDL 53 07/05/2017 0947   CHOLHDL 3.8 07/05/2017 0947   CHOLHDL 2.8 04/09/2015 0839   VLDL 24 04/09/2015 0839   LDLCALC 121 (H) 07/05/2017 0947     Wt Readings from Last 3 Encounters:  10/11/22 231 lb 6.4 oz (105 kg)  08/26/22 237 lb 9.6 oz (107.8 kg)  07/14/22 230 lb (104.3 kg)  Other studies Reviewed: Additional studies/ records that were reviewed today include: TTE 04/07/22  Review of the above records today demonstrates:   1. Left ventricular ejection fraction, by estimation, is 50 to 55%. The  left ventricle has low normal function. The left ventricle has no regional  wall motion abnormalities. Left ventricular diastolic function could not  be evaluated.   2. Right ventricular systolic function was not well visualized. The right  ventricular size is normal. Tricuspid regurgitation signal is inadequate  for assessing PA pressure.   3. The mitral valve is normal in structure. Trivial mitral valve  regurgitation. No evidence of mitral stenosis.   4. The aortic valve is calcified. There is mild calcification of the  aortic valve. There is moderate thickening of the aortic valve. Aortic  valve regurgitation is not visualized. Mild to moderate aortic valve  sclerosis/calcification is present, without  any evidence of aortic stenosis.   5. The inferior vena cava is dilated in size with >50% respiratory  variability, suggesting right atrial pressure of 8 mmHg.    ASSESSMENT AND PLAN:  1.  Persistent atrial fibrillation: Currently on dofetilide, carvedilol, Eliquis.  CHA2DS2-VASc of 3.  Status post ablation x 3 on 01/31/2015, 05/06/2020.  Had a recent ablation  07/14/2022.  Since his most recent ablation he has done well.  He has had no further episodes of atrial fibrillation or atrial flutter.  He may wish to stop his dofetilide prior to his next visit.  He Zeph Riebel let us know if he chooses that option.  2.  Secondary hypercoagulable state: Currently on Eliquis for atrial fibrillation  3.  High risk medication monitoring: Currently on dofetilide.  Recent labs within normal limits.  4.  Hypertension: Mildly elevated today.  Usually well-controlled.  Can be adjusted by primary physician.   Current medicines are reviewed at length with the patient today.   The patient does not have concerns regarding his medicines.  The following changes were made today: None  Labs/ tests ordered today include:  Orders Placed This Encounter  Procedures   EKG 12-Lead     Disposition:   FU 6 months  Signed, Jayana Kotula Jorja Loa, MD  10/11/2022 12:27 PM     [facility] [address] [place] [address] [phone] (office) [phone] (fax)

## 2022-10-11 NOTE — Patient Instructions (Signed)
Medication Instructions:  °Your physician recommends that you continue on your current medications as directed. Please refer to the Current Medication list given to you today. ° °*If you need a refill on your cardiac medications before your next appointment, please call your pharmacy* ° ° °Lab Work: °None ordered ° ° °Testing/Procedures: °None ordered ° ° °Follow-Up: °At [facility], you and your health needs are our priority.  As part of our continuing mission to provide you with exceptional heart care, we have created designated Provider Care Teams.  These Care Teams include your primary Cardiologist (physician) and Advanced Practice Providers (APPs -  Physician Assistants and Nurse Practitioners) who all work together to provide you with the care you need, when you need it. ° °Your next appointment:   °6 month(s) ° °The format for your next appointment:   °In Person ° °Provider:   °Will Camnitz, MD ° ° ° °Thank you for choosing [facility]!! ° ° °Denina Rieger, RN °[phone] °  °

## 2022-12-13 ENCOUNTER — Other Ambulatory Visit (HOSPITAL_COMMUNITY): Payer: Self-pay | Admitting: Physician Assistant

## 2023-01-20 ENCOUNTER — Telehealth: Payer: Self-pay | Admitting: Pharmacist

## 2023-01-20 NOTE — Telephone Encounter (Signed)
Verbal given to [employer] to replace Repatha 140mg  sureclick pen

## 2023-04-12 ENCOUNTER — Encounter: Payer: Self-pay | Admitting: Cardiology

## 2023-04-12 ENCOUNTER — Ambulatory Visit: Payer: PPO | Attending: Cardiology | Admitting: Cardiology

## 2023-04-12 VITALS — BP 130/68 | HR 74 | Ht 71.0 in | Wt 237.4 lb

## 2023-04-12 DIAGNOSIS — D6869 Other thrombophilia: Secondary | ICD-10-CM | POA: Diagnosis not present

## 2023-04-12 DIAGNOSIS — I4819 Other persistent atrial fibrillation: Secondary | ICD-10-CM | POA: Diagnosis not present

## 2023-04-12 DIAGNOSIS — T466X5D Adverse effect of antihyperlipidemic and antiarteriosclerotic drugs, subsequent encounter: Secondary | ICD-10-CM

## 2023-04-12 DIAGNOSIS — G720 Drug-induced myopathy: Secondary | ICD-10-CM

## 2023-04-12 DIAGNOSIS — I10 Essential (primary) hypertension: Secondary | ICD-10-CM | POA: Diagnosis not present

## 2023-04-12 DIAGNOSIS — T466X5A Adverse effect of antihyperlipidemic and antiarteriosclerotic drugs, initial encounter: Secondary | ICD-10-CM

## 2023-04-12 NOTE — Patient Instructions (Signed)
Medication Instructions:  Your physician has recommended you make the following change in your medication:  STOP Tikosyn  *If you need a refill on your cardiac medications before your next appointment, please call your pharmacy*   Lab Work: None ordered   Testing/Procedures: None ordered   Follow-Up: At [facility], you and your health needs are our priority.  As part of our continuing mission to provide you with exceptional heart care, we have created designated Provider Care Teams.  These Care Teams include your primary Cardiologist (physician) and Advanced Practice Providers (APPs -  Physician Assistants and Nurse Practitioners) who all work together to provide you with the care you need, when you need it.  Your next appointment:   6 month(s)  The format for your next appointment:   In Person  Provider:   You will follow up in the Atrial Fibrillation Clinic located at [facility]. Your provider will be: Clint R. Fenton, PA-C or Lake Bells, PA-C{    Thank you for choosing [facility]!!   Dory Horn, RN [phone]

## 2023-04-12 NOTE — Progress Notes (Signed)
  Electrophysiology Office Note:   Date:  04/12/2023  ID:  Dennis King, DOB [birth year], MRN [id number]  Primary Cardiologist: Nanetta Batty, MD Electrophysiologist: Regan Lemming, MD      History of Present Illness:   Dennis King is a 72 y.o. male with h/o atrial fibrillation, hypertension, OSA, diabetes, coronary artery disease seen today for routine electrophysiology followup.   Since last being seen in our clinic the patient reports doing well.  He has had 1 episode of atrial fibrillation since his ablation.  This occurred for 15 minutes and was 1 month ago.  He was quite stressed at the time, got stuck in traffic and was watching a football game.  Episode was short-lived.  He has been dealing with issues with his brother.  His brother had a recent ablation and was on amiodarone.  He has been diagnosed with pulmonary fibrosis.  he denies chest pain, palpitations, dyspnea, PND, orthopnea, nausea, vomiting, dizziness, syncope, edema, weight gain, or early satiety.   Review of systems complete and found to be negative unless listed in HPI.   EP Information / Studies Reviewed:    EKG is ordered today. Personal review as below.  EKG Interpretation Date/Time:  Tuesday April 12 2023 10:39:50 EST Ventricular Rate:  74 PR Interval:  164 QRS Duration:  92 QT Interval:  410 QTC Calculation: 455 R Axis:   65  Text Interpretation: Normal sinus rhythm Normal ECG When compared with ECG of 26-Aug-2022 08:40, Sinus rhythm has replaced Junctional rhythm Confirmed by Dennis King ([id number]) on 04/12/2023 10:45:23 AM     Risk Assessment/Calculations:    CHA2DS2-VASc Score = 3   This indicates a 3.2% annual risk of stroke. The patient's score is based upon: CHF History: 0 HTN History: 1 Diabetes History: 1 Stroke History: 0 Vascular Disease History: 0 Age Score: 1 Gender Score: 0          Physical Exam:   VS:  BP 130/68 (BP Location: Left Arm, Patient Position:  Sitting, Cuff Size: Large)   Pulse 74   Ht 5\' 11"  (1.803 m)   Wt 237 lb 6.4 oz (107.7 kg)   SpO2 95%   BMI 33.11 kg/m    Wt Readings from Last 3 Encounters:  04/12/23 237 lb 6.4 oz (107.7 kg)  10/11/22 231 lb 6.4 oz (105 kg)  08/26/22 237 lb 9.6 oz (107.8 kg)     GEN: Well nourished, well developed in no acute distress NECK: No JVD; No carotid bruits CARDIAC: Regular rate and rhythm, no murmurs, rubs, gallops RESPIRATORY:  Clear to auscultation without rales, wheezing or rhonchi  ABDOMEN: Soft, non-tender, non-distended EXTREMITIES:  No edema; No deformity   ASSESSMENT AND PLAN:    1.  Persistent atrial fibrillation: Currently on dofetilide and carvedilol.  Post ablation x 3 01/31/2015, 05/06/2020, 07/14/2022.  He remains in sinus rhythm.  He would like to be off antiarrhythmics.  Dennis King stop dofetilide today.  2.  Secondary hypercoagulable state: Currently on Eliquis for atrial fibrillation  3.  Hypertension: Currently well-controlled  4.  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

## 2023-05-01 ENCOUNTER — Other Ambulatory Visit: Payer: Self-pay | Admitting: Cardiology

## 2023-05-14 ENCOUNTER — Other Ambulatory Visit (HOSPITAL_COMMUNITY): Payer: Self-pay | Admitting: Cardiology

## 2023-05-16 ENCOUNTER — Other Ambulatory Visit: Payer: Self-pay | Admitting: Cardiology

## 2023-05-16 DIAGNOSIS — E7800 Pure hypercholesterolemia, unspecified: Secondary | ICD-10-CM

## 2023-05-24 DIAGNOSIS — I2510 Atherosclerotic heart disease of native coronary artery without angina pectoris: Secondary | ICD-10-CM | POA: Diagnosis not present

## 2023-05-24 DIAGNOSIS — I700 Atherosclerosis of aorta: Secondary | ICD-10-CM | POA: Diagnosis not present

## 2023-05-24 DIAGNOSIS — D6869 Other thrombophilia: Secondary | ICD-10-CM | POA: Diagnosis not present

## 2023-05-24 DIAGNOSIS — E782 Mixed hyperlipidemia: Secondary | ICD-10-CM | POA: Diagnosis not present

## 2023-05-24 DIAGNOSIS — N401 Enlarged prostate with lower urinary tract symptoms: Secondary | ICD-10-CM | POA: Diagnosis not present

## 2023-05-24 DIAGNOSIS — E1142 Type 2 diabetes mellitus with diabetic polyneuropathy: Secondary | ICD-10-CM | POA: Diagnosis not present

## 2023-05-24 DIAGNOSIS — Z23 Encounter for immunization: Secondary | ICD-10-CM | POA: Diagnosis not present

## 2023-05-24 DIAGNOSIS — M1990 Unspecified osteoarthritis, unspecified site: Secondary | ICD-10-CM | POA: Diagnosis not present

## 2023-05-24 DIAGNOSIS — I10 Essential (primary) hypertension: Secondary | ICD-10-CM | POA: Diagnosis not present

## 2023-05-24 DIAGNOSIS — R918 Other nonspecific abnormal finding of lung field: Secondary | ICD-10-CM | POA: Diagnosis not present

## 2023-05-24 DIAGNOSIS — Z0000 Encounter for general adult medical examination without abnormal findings: Secondary | ICD-10-CM | POA: Diagnosis not present

## 2023-05-24 DIAGNOSIS — I480 Paroxysmal atrial fibrillation: Secondary | ICD-10-CM | POA: Diagnosis not present

## 2023-07-29 ENCOUNTER — Other Ambulatory Visit: Payer: Self-pay | Admitting: Cardiology

## 2023-07-29 ENCOUNTER — Telehealth: Payer: Self-pay | Admitting: Cardiology

## 2023-07-29 MED ORDER — DILTIAZEM HCL 30 MG PO TABS: ORAL_TABLET | ORAL | 8 refills | Status: DC

## 2023-07-29 NOTE — Telephone Encounter (Signed)
 Patient c/o Palpitations:  STAT if patient reporting lightheadedness, shortness of breath, or chest pain  How long have you had palpitations/irregular HR/ Afib? Are you having the symptoms now? Afib-Wednesday night  Are you currently experiencing lightheadedness, SOB or CP? No  Do you have a history of afib (atrial fibrillation) or irregular heart rhythm? yes  Have you checked your BP or HR? (document readings if available): HR 150, with diltiazem 110, BP 100/65  Are you experiencing any other symptoms? No

## 2023-07-29 NOTE — Telephone Encounter (Signed)
 Pt's medication was sent to pt's pharmacy as requested. Confirmation received.

## 2023-07-29 NOTE — Telephone Encounter (Signed)
*  STAT* If patient is at the pharmacy, call can be transferred to refill team.   1. Which medications need to be refilled? (please list name of each medication and dose if known) diltiazem (CARDIZEM) 30 MG tablet   2. Which pharmacy/location (including street and city if local pharmacy) is medication to be sent to? [id number] - [place], [place] - [address]    3. Do they need a 30 day or 90 day supply? 30

## 2023-07-29 NOTE — Telephone Encounter (Signed)
 Pt has been in afib since Wednesday night.  Does not have chest pain, just "some pressure but no pain".  He also get dizzy when standing quickly.  HRs were in 150s, taking diltiazem PRN to help -- they come down to 110-110s with symptoms almost gone at lower HRs.  Patient says if he does too much he gets fatigued. Aware sending to MD for advisement, most likely will need to start a low dose daily Diltiazem and get pt appt in the afib clinic next week.  Patient aware will call w/ MD advisement

## 2023-08-01 NOTE — Telephone Encounter (Signed)
 Pt seeing afib clinic tomorrow

## 2023-08-02 ENCOUNTER — Encounter (HOSPITAL_COMMUNITY): Payer: Self-pay | Admitting: Physician Assistant

## 2023-08-02 ENCOUNTER — Ambulatory Visit (HOSPITAL_COMMUNITY)
Admission: RE | Admit: 2023-08-02 | Discharge: 2023-08-02 | Disposition: A | Discharge status: Home / Self Care | Source: Ambulatory Visit | Attending: Physician Assistant | Admitting: Physician Assistant

## 2023-08-02 VITALS — BP 118/70 | HR 101 | Ht 71.0 in | Wt 239.0 lb

## 2023-08-02 DIAGNOSIS — Z7985 Long-term (current) use of injectable non-insulin antidiabetic drugs: Secondary | ICD-10-CM | POA: Diagnosis not present

## 2023-08-02 DIAGNOSIS — Z7901 Long term (current) use of anticoagulants: Secondary | ICD-10-CM | POA: Diagnosis not present

## 2023-08-02 DIAGNOSIS — I10 Essential (primary) hypertension: Secondary | ICD-10-CM | POA: Diagnosis not present

## 2023-08-02 DIAGNOSIS — E119 Type 2 diabetes mellitus without complications: Secondary | ICD-10-CM | POA: Diagnosis not present

## 2023-08-02 DIAGNOSIS — I252 Old myocardial infarction: Secondary | ICD-10-CM | POA: Insufficient documentation

## 2023-08-02 DIAGNOSIS — Z79899 Other long term (current) drug therapy: Secondary | ICD-10-CM | POA: Insufficient documentation

## 2023-08-02 DIAGNOSIS — Z7984 Long term (current) use of oral hypoglycemic drugs: Secondary | ICD-10-CM | POA: Insufficient documentation

## 2023-08-02 DIAGNOSIS — D6869 Other thrombophilia: Secondary | ICD-10-CM | POA: Diagnosis not present

## 2023-08-02 DIAGNOSIS — I429 Cardiomyopathy, unspecified: Secondary | ICD-10-CM | POA: Insufficient documentation

## 2023-08-02 DIAGNOSIS — I2510 Atherosclerotic heart disease of native coronary artery without angina pectoris: Secondary | ICD-10-CM | POA: Insufficient documentation

## 2023-08-02 DIAGNOSIS — G4733 Obstructive sleep apnea (adult) (pediatric): Secondary | ICD-10-CM | POA: Diagnosis not present

## 2023-08-02 DIAGNOSIS — I4819 Other persistent atrial fibrillation: Secondary | ICD-10-CM | POA: Insufficient documentation

## 2023-08-02 LAB — BASIC METABOLIC PANEL
Anion gap: 9 (ref 5–15)
BUN: 23 mg/dL (ref 8–23)
CO2: 25 mmol/L (ref 22–32)
Calcium: 9.7 mg/dL (ref 8.9–10.3)
Chloride: 102 mmol/L (ref 98–111)
Creatinine, Ser: 1.09 mg/dL (ref 0.61–1.24)
GFR, Estimated: >60 mL/min (ref >60)
Glucose, Bld: 213 mg/dL — ABNORMAL HIGH (ref 70–99)
Potassium: 4.2 mmol/L (ref 3.5–5.1)
Sodium: 136 mmol/L (ref 135–145)

## 2023-08-02 LAB — CBC
HCT: 44 % (ref 39.0–52.0)
Hemoglobin: 14.6 g/dL (ref 13.0–17.0)
MCH: 31.5 pg (ref 26.0–34.0)
MCHC: 33.2 g/dL (ref 30.0–36.0)
MCV: 95 fL (ref 80.0–100.0)
Platelets: 221 10*3/uL (ref 150–400)
RBC: 4.63 MIL/uL (ref 4.22–5.81)
RDW: 12.9 % (ref 11.5–15.5)
WBC: 7 10*3/uL (ref 4.0–10.5)
nRBC: 0 % (ref 0.0–0.2)

## 2023-08-02 MED ORDER — DILTIAZEM HCL 60 MG PO TABS: ORAL_TABLET | ORAL | 1 refills | Status: AC

## 2023-08-02 NOTE — Progress Notes (Addendum)
 Primary Care Physician: Henrine Screws, MD Referring Physician: Dr. [name]  Primary EP: Dr Daine Gravel is a 73 y.o. male with a h/o persistent afib, HTN, OSA on CPAP, T2DM,  s/p ablation cardiomyopathy, CAD, that is in the afib clinic for admission for Tikosyn per Dr. [name]. He stopped Multaq and HCTZ on 9/28. He called the office yesterday as he was having some shortness of breath and fluid retention. He was given lasix/K+ yesterday and again this am and this helped with his symptoms.  He has not missed any anticoagulation for at least 3 weeks  and no benadryl use. He has cut back but continues with daily alcohol and caffeine use.   F/u in afib clinic, 03/25/21. He is one week out from [place] load. He had been feeling so much better and was surprised that he was back in afib this am. He was placed on spironolactone in the hospital and has lost 10 lbs. Is feeling much improved from  abdominal swelling and exertional shortness of breath.   F/u in afib clinic, 02/09/22 for Tikosyn surveillance. He reports having afib about every 2 weeks lasing around 12 hours. He has had 2 ablations in the past with Dr. [name]. He is complaint with tikosyn.  We discussed today establishing with another EP in Dr. Jenel Lucks absence to see if a 3 rd ablation would be necessary and he is interested to proceed.   Follow up in the AF clinic 06/15/22. Patient reports that he has an episode of palpitations and fatigue once every 1-2 weeks. They typically last 2-3 hours but can be up to 12 hours. He was started on lisinopril HCTZ 3 days ago by his PCP. He is scheduled for an afib ablation 07/14/22.  Follow up in the AF clinic 08/26/22. Patient is now s/p ablation by Dr. Elberta Fortis on 07/14/22. He is doing great overall. He is in SR today and has not had any episodes of Afib since ablation. No chest pain, shortness of breath, or trouble swallowing. Leg sites healed without issue. He has been compliant with  anticoagulation and no bleeding concerns. He is interested in coming off Tikosyn and will discuss with Dr. Elberta Fortis at 3 month f/u.   Follow up 08/02/23. Patient returns for follow up for atrial fibrillation. He reports that he went back into afib on 07/27/23. He had symptoms of tachypalpitations, fatigue, and noted low BP. He has been using PRN diltiazem to slow his heart rate which has improved his symptoms. He denies any missed doses of anticoagulation.   Today, he denies symptoms of chest pain, shortness of breath, orthopnea, PND, lower extremity edema, dizziness, presyncope, syncope, snoring, daytime somnolence, bleeding, or neurologic sequela. The patient is tolerating medications without difficulties and is otherwise without complaint today.    Past Medical History:  Diagnosis Date   Arthritis    Coronary artery disease    past post circumflex obtuse marginal branch cutting balloon atherectomy by Dr. [name]   Family history of heart disease    Hyperlipidemia    Hypertension    Obesity    Obstructive sleep apnea    on C Pap   Paroxysmal A-fib (HCC)    Type 2 diabetes mellitus (HCC)     Current Outpatient Medications  Medication Sig Dispense Refill   apixaban (ELIQUIS) 5 MG TABS tablet TAKE ONE TABLET BY MOUTH TWICE DAILY 180 tablet 1   carvedilol (COREG) 25 MG tablet Take 25 mg by mouth 2 (two)  times daily with a meal.      cholecalciferol (VITAMIN D) 25 MCG (1000 UNIT) tablet Take 1,000 Units by mouth every evening.     Continuous Blood Gluc Receiver (FREESTYLE LIBRE 2 READER) DEVI Use as directed to monitor blood glucose     Continuous Blood Gluc Sensor (FREESTYLE LIBRE 2 SENSOR) MISC Use as directed every 14 days     Evolocumab (REPATHA SURECLICK) 140 MG/ML SOAJ INJECT ONE PEN INTO THE SKIN EVERY 14 DAYS 6 mL 3   INVOKANA 300 MG TABS tablet Take 300 mg by mouth daily.  4   Krill Oil 1000 MG CAPS Take 3,000 capsules by mouth every morning.     lisinopril (ZESTRIL) 20 MG tablet  TAKE 1 TABLET BY MOUTH EVERY DAY 90 tablet 3   metFORMIN (GLUCOPHAGE) 1000 MG tablet Take 1,000 mg by mouth 2 (two) times daily with a meal.      Multiple Vitamin (MULTIVITAMIN WITH MINERALS) TABS tablet Take 1 tablet by mouth daily.     nitroGLYCERIN (NITROSTAT) 0.4 MG SL tablet Place 1 tablet (0.4 mg total) under the tongue every 5 (five) minutes as needed for chest pain. 25 tablet 4   NON FORMULARY 1 Dose by Other route at bedtime. Cpap 8cm H2O     NOVOLOG MIX 70/30 (70-30) 100 UNIT/ML injection Inject 10-20 Units into the skin in the morning and at bedtime. Sliding scale  4   OZEMPIC, 0.25 OR 0.5 MG/DOSE, 2 MG/3ML SOPN Inject 1 mg into the skin once a week.     sildenafil (REVATIO) 20 MG tablet Take 20 mg by mouth daily as needed (ED). Taking 2-5 tablets by mouth as needed     diltiazem (CARDIZEM) 60 MG tablet TAKE ONE TABLET EVERY FOUR HOURS AS NEEDED FOR HEART RATE >100 45 tablet 1   No current facility-administered medications for this encounter.    ROS- All systems are reviewed and negative except as per the HPI above  Physical Exam: Vitals:   08/02/23 0858  BP: 118/70  Pulse: (!) 101  Weight: 108.4 kg  Height: 5\' 11"  (1.803 m)    Wt Readings from Last 3 Encounters:  08/02/23 108.4 kg  04/12/23 107.7 kg  10/11/22 105 kg    GEN: Well nourished, well developed in no acute distress CARDIAC: Irregularly irregular rate and rhythm, no murmurs, rubs, gallops RESPIRATORY:  Clear to auscultation without rales, wheezing or rhonchi  ABDOMEN: Soft, non-tender, non-distended EXTREMITIES:  No edema; No deformity     EKG today demonstrates Afib Vent. rate 101 BPM PR interval * ms QRS duration 90 ms QT/QTcB 326/422 ms   Echo 04/07/20 1. Left ventricular ejection fraction, by estimation, is 50 to 55%. The  left ventricle has low normal function. The left ventricle has no regional wall motion abnormalities. Left ventricular diastolic function could not be evaluated.   2. Right  ventricular systolic function was not well visualized. The right ventricular size is normal. Tricuspid regurgitation signal is inadequate for assessing PA pressure.   3. The mitral valve is normal in structure. Trivial mitral valve  regurgitation. No evidence of mitral stenosis.   4. The aortic valve is calcified. There is mild calcification of the  aortic valve. There is moderate thickening of the aortic valve. Aortic  valve regurgitation is not visualized. Mild to moderate aortic valve  sclerosis/calcification is present, without  any evidence of aortic stenosis.   5. The inferior vena cava is dilated in size with >50% respiratory  variability,  suggesting right atrial pressure of 8 mmHg.    CHA2DS2-VASc Score = 4  The patient's score is based upon: CHF History: 0 HTN History: 1 Diabetes History: 1 Stroke History: 0 Vascular Disease History: 1 Age Score: 1 Gender Score: 0       ASSESSMENT AND PLAN: Persistent Atrial Fibrillation (ICD10:  I48.19) The patient's CHA2DS2-VASc score is 4, indicating a 4.8% annual risk of stroke.   S/p afib ablation 2016, 2021, 07/14/22.  Now off dofetilide.  Patient back in persistent afib. We discussed rhythm control options. Will plan for DCCV. His last dose of Ozempic was on 3/10. Continue carvedilol 25 mg BID Continue Eliquis 5 mg BID Continue diltiazem 60 mg PRN q 4 hours for heart racing.  Secondary Hypercoagulable State (ICD10:  D68.69) The patient is at significant risk for stroke/thromboembolism based upon his CHA2DS2-VASc Score of 4.  Continue Apixaban (Eliquis). No bleeding issues.  HTN Stable on current regimen  CAD H/o NSTEMI 2016 CAC score 1857 on CT Will refer him to [facility] to establish care.     Follow up in the AF clinic post DCCV.    Informed Consent   Shared Decision Making/Informed Consent The risks (stroke, cardiac arrhythmias rarely resulting in the need for a temporary or permanent pacemaker, skin  irritation or burns and complications associated with conscious sedation including aspiration, arrhythmia, respiratory failure and death), benefits (restoration of normal sinus rhythm) and alternatives of a direct current cardioversion were explained in detail to Mr. Winokur and he agrees to proceed.       Jorja Loa PA-C Afib Clinic [facility] [address] [place], [place] [address] [phone]

## 2023-08-02 NOTE — Patient Instructions (Addendum)
 Cardizem 60mg  -- Take 1 tablet every 4-6 hours AS NEEDED for AFIB heart rate >100 as long as top BP >100.      Cardioversion scheduled for: Tuesday, March 18th   - Arrive at the [facility] and go to admitting at 7:00am   - Do not eat or drink anything after midnight the night prior to your procedure.   - Take all your morning medication (except metformin/insulin) with a sip of water prior to arrival.  - You will not be able to drive home after your procedure.    - Do NOT miss any doses of your blood thinner - if you should miss a dose please notify our office immediately.   - If you feel as if you go back into normal rhythm prior to scheduled cardioversion, please notify our office immediately.   If your procedure is canceled in the cardioversion suite you will be charged a cancellation fee.    Hold below medications 7 days prior to scheduled procedure/anesthesia.  Restart medication on the normal dosing day after scheduled procedure/anesthesia  Semaglutide (Ozempic) (WEGOVY)     Hold below medications 72 hours prior to scheduled procedure/anesthesia. Restart medication on the following day after scheduled procedure/anesthesia  Canagliflozin (Invokana)     For those patients who have a scheduled procedure/anesthesia on the same day of the week as their dose, hold the medication on the day of surgery.  They can take their scheduled dose the week before.  **Patients on the above medications scheduled for elective procedures that have not held the medication for the appropriate amount of time are at risk of cancellation or change in the anesthetic plan.

## 2023-08-02 NOTE — H&P (View-Only) (Signed)
 Primary Care Physician: Henrine Screws, MD Referring Physician: Dr. [name]  Primary EP: Dr Daine Gravel is a 73 y.o. male with a h/o persistent afib, HTN, OSA on CPAP, T2DM,  s/p ablation cardiomyopathy, CAD, that is in the afib clinic for admission for Tikosyn per Dr. [name]. He stopped Multaq and HCTZ on 9/28. He called the office yesterday as he was having some shortness of breath and fluid retention. He was given lasix/K+ yesterday and again this am and this helped with his symptoms.  He has not missed any anticoagulation for at least 3 weeks  and no benadryl use. He has cut back but continues with daily alcohol and caffeine use.   F/u in afib clinic, 03/25/21. He is one week out from [place] load. He had been feeling so much better and was surprised that he was back in afib this am. He was placed on spironolactone in the hospital and has lost 10 lbs. Is feeling much improved from  abdominal swelling and exertional shortness of breath.   F/u in afib clinic, 02/09/22 for Tikosyn surveillance. He reports having afib about every 2 weeks lasing around 12 hours. He has had 2 ablations in the past with Dr. [name]. He is complaint with tikosyn.  We discussed today establishing with another EP in Dr. Jenel Lucks absence to see if a 3 rd ablation would be necessary and he is interested to proceed.   Follow up in the AF clinic 06/15/22. Patient reports that he has an episode of palpitations and fatigue once every 1-2 weeks. They typically last 2-3 hours but can be up to 12 hours. He was started on lisinopril HCTZ 3 days ago by his PCP. He is scheduled for an afib ablation 07/14/22.  Follow up in the AF clinic 08/26/22. Patient is now s/p ablation by Dr. Elberta Fortis on 07/14/22. He is doing great overall. He is in SR today and has not had any episodes of Afib since ablation. No chest pain, shortness of breath, or trouble swallowing. Leg sites healed without issue. He has been compliant with  anticoagulation and no bleeding concerns. He is interested in coming off Tikosyn and will discuss with Dr. Elberta Fortis at 3 month f/u.   Follow up 08/02/23. Patient returns for follow up for atrial fibrillation. He reports that he went back into afib on 07/27/23. He had symptoms of tachypalpitations, fatigue, and noted low BP. He has been using PRN diltiazem to slow his heart rate which has improved his symptoms. He denies any missed doses of anticoagulation.   Today, he denies symptoms of chest pain, shortness of breath, orthopnea, PND, lower extremity edema, dizziness, presyncope, syncope, snoring, daytime somnolence, bleeding, or neurologic sequela. The patient is tolerating medications without difficulties and is otherwise without complaint today.    Past Medical History:  Diagnosis Date   Arthritis    Coronary artery disease    past post circumflex obtuse marginal branch cutting balloon atherectomy by Dr. [name]   Family history of heart disease    Hyperlipidemia    Hypertension    Obesity    Obstructive sleep apnea    on C Pap   Paroxysmal A-fib (HCC)    Type 2 diabetes mellitus (HCC)     Current Outpatient Medications  Medication Sig Dispense Refill   apixaban (ELIQUIS) 5 MG TABS tablet TAKE ONE TABLET BY MOUTH TWICE DAILY 180 tablet 1   carvedilol (COREG) 25 MG tablet Take 25 mg by mouth 2 (two)  times daily with a meal.      cholecalciferol (VITAMIN D) 25 MCG (1000 UNIT) tablet Take 1,000 Units by mouth every evening.     Continuous Blood Gluc Receiver (FREESTYLE LIBRE 2 READER) DEVI Use as directed to monitor blood glucose     Continuous Blood Gluc Sensor (FREESTYLE LIBRE 2 SENSOR) MISC Use as directed every 14 days     Evolocumab (REPATHA SURECLICK) 140 MG/ML SOAJ INJECT ONE PEN INTO THE SKIN EVERY 14 DAYS 6 mL 3   INVOKANA 300 MG TABS tablet Take 300 mg by mouth daily.  4   Krill Oil 1000 MG CAPS Take 3,000 capsules by mouth every morning.     lisinopril (ZESTRIL) 20 MG tablet  TAKE 1 TABLET BY MOUTH EVERY DAY 90 tablet 3   metFORMIN (GLUCOPHAGE) 1000 MG tablet Take 1,000 mg by mouth 2 (two) times daily with a meal.      Multiple Vitamin (MULTIVITAMIN WITH MINERALS) TABS tablet Take 1 tablet by mouth daily.     nitroGLYCERIN (NITROSTAT) 0.4 MG SL tablet Place 1 tablet (0.4 mg total) under the tongue every 5 (five) minutes as needed for chest pain. 25 tablet 4   NON FORMULARY 1 Dose by Other route at bedtime. Cpap 8cm H2O     NOVOLOG MIX 70/30 (70-30) 100 UNIT/ML injection Inject 10-20 Units into the skin in the morning and at bedtime. Sliding scale  4   OZEMPIC, 0.25 OR 0.5 MG/DOSE, 2 MG/3ML SOPN Inject 1 mg into the skin once a week.     sildenafil (REVATIO) 20 MG tablet Take 20 mg by mouth daily as needed (ED). Taking 2-5 tablets by mouth as needed     diltiazem (CARDIZEM) 60 MG tablet TAKE ONE TABLET EVERY FOUR HOURS AS NEEDED FOR HEART RATE >100 45 tablet 1   No current facility-administered medications for this encounter.    ROS- All systems are reviewed and negative except as per the HPI above  Physical Exam: Vitals:   08/02/23 0858  BP: 118/70  Pulse: (!) 101  Weight: 108.4 kg  Height: 5\' 11"  (1.803 m)    Wt Readings from Last 3 Encounters:  08/02/23 108.4 kg  04/12/23 107.7 kg  10/11/22 105 kg    GEN: Well nourished, well developed in no acute distress CARDIAC: Irregularly irregular rate and rhythm, no murmurs, rubs, gallops RESPIRATORY:  Clear to auscultation without rales, wheezing or rhonchi  ABDOMEN: Soft, non-tender, non-distended EXTREMITIES:  No edema; No deformity     EKG today demonstrates Afib Vent. rate 101 BPM PR interval * ms QRS duration 90 ms QT/QTcB 326/422 ms   Echo 04/07/20 1. Left ventricular ejection fraction, by estimation, is 50 to 55%. The  left ventricle has low normal function. The left ventricle has no regional wall motion abnormalities. Left ventricular diastolic function could not be evaluated.   2. Right  ventricular systolic function was not well visualized. The right ventricular size is normal. Tricuspid regurgitation signal is inadequate for assessing PA pressure.   3. The mitral valve is normal in structure. Trivial mitral valve  regurgitation. No evidence of mitral stenosis.   4. The aortic valve is calcified. There is mild calcification of the  aortic valve. There is moderate thickening of the aortic valve. Aortic  valve regurgitation is not visualized. Mild to moderate aortic valve  sclerosis/calcification is present, without  any evidence of aortic stenosis.   5. The inferior vena cava is dilated in size with >50% respiratory  variability,  suggesting right atrial pressure of 8 mmHg.    CHA2DS2-VASc Score = 4  The patient's score is based upon: CHF History: 0 HTN History: 1 Diabetes History: 1 Stroke History: 0 Vascular Disease History: 1 Age Score: 1 Gender Score: 0       ASSESSMENT AND PLAN: Persistent Atrial Fibrillation (ICD10:  I48.19) The patient's CHA2DS2-VASc score is 4, indicating a 4.8% annual risk of stroke.   S/p afib ablation 2016, 2021, 07/14/22.  Now off dofetilide.  Patient back in persistent afib. We discussed rhythm control options. Will plan for DCCV. His last dose of Ozempic was on 3/10. Continue carvedilol 25 mg BID Continue Eliquis 5 mg BID Continue diltiazem 60 mg PRN q 4 hours for heart racing.  Secondary Hypercoagulable State (ICD10:  D68.69) The patient is at significant risk for stroke/thromboembolism based upon his CHA2DS2-VASc Score of 4.  Continue Apixaban (Eliquis). No bleeding issues.  HTN Stable on current regimen  CAD H/o NSTEMI 2016 CAC score 1857 on CT Will refer him to [facility] to establish care.     Follow up in the AF clinic post DCCV.    Informed Consent   Shared Decision Making/Informed Consent The risks (stroke, cardiac arrhythmias rarely resulting in the need for a temporary or permanent pacemaker, skin  irritation or burns and complications associated with conscious sedation including aspiration, arrhythmia, respiratory failure and death), benefits (restoration of normal sinus rhythm) and alternatives of a direct current cardioversion were explained in detail to Mr. Winokur and he agrees to proceed.       Jorja Loa PA-C Afib Clinic [facility] [address] [place], [place] [address] [phone]

## 2023-08-08 NOTE — Progress Notes (Signed)
 Called patient with pre-procedure instructions for tomorrow. Left detailed voicemail  Patient informed of:   Time to arrive for procedure 0700 Remain NPO past midnight.  Must have a ride home and a responsible adult to remain with them for 24 hours post procedure.  Confirmed blood thinner. Eliquis Confirmed no breaks in taking blood thinner for 3+ weeks prior to procedure. Confirmed patient stopped all GLP-1s and GLP-2s for at least one week before procedure. Ozempic

## 2023-08-09 ENCOUNTER — Encounter (HOSPITAL_COMMUNITY)
Admission: RE | Disposition: A | Discharge status: Home / Self Care | Payer: Self-pay | Source: Home / Self Care | Attending: Cardiology

## 2023-08-09 ENCOUNTER — Other Ambulatory Visit: Payer: Self-pay

## 2023-08-09 ENCOUNTER — Ambulatory Visit (HOSPITAL_COMMUNITY): Admitting: Certified Registered"

## 2023-08-09 ENCOUNTER — Encounter (HOSPITAL_COMMUNITY): Payer: Self-pay | Admitting: Cardiology

## 2023-08-09 ENCOUNTER — Ambulatory Visit (HOSPITAL_COMMUNITY)
Admission: RE | Admit: 2023-08-09 | Discharge: 2023-08-09 | Disposition: A | Discharge status: Home / Self Care | Attending: Cardiology | Admitting: Cardiology

## 2023-08-09 DIAGNOSIS — I252 Old myocardial infarction: Secondary | ICD-10-CM | POA: Diagnosis not present

## 2023-08-09 DIAGNOSIS — Z7901 Long term (current) use of anticoagulants: Secondary | ICD-10-CM | POA: Insufficient documentation

## 2023-08-09 DIAGNOSIS — I429 Cardiomyopathy, unspecified: Secondary | ICD-10-CM | POA: Diagnosis not present

## 2023-08-09 DIAGNOSIS — Z7985 Long-term (current) use of injectable non-insulin antidiabetic drugs: Secondary | ICD-10-CM | POA: Diagnosis not present

## 2023-08-09 DIAGNOSIS — I4891 Unspecified atrial fibrillation: Secondary | ICD-10-CM | POA: Diagnosis not present

## 2023-08-09 DIAGNOSIS — I10 Essential (primary) hypertension: Secondary | ICD-10-CM | POA: Diagnosis not present

## 2023-08-09 DIAGNOSIS — I4819 Other persistent atrial fibrillation: Secondary | ICD-10-CM | POA: Insufficient documentation

## 2023-08-09 DIAGNOSIS — G4733 Obstructive sleep apnea (adult) (pediatric): Secondary | ICD-10-CM | POA: Insufficient documentation

## 2023-08-09 DIAGNOSIS — I2510 Atherosclerotic heart disease of native coronary artery without angina pectoris: Secondary | ICD-10-CM | POA: Diagnosis not present

## 2023-08-09 DIAGNOSIS — Z7984 Long term (current) use of oral hypoglycemic drugs: Secondary | ICD-10-CM | POA: Insufficient documentation

## 2023-08-09 DIAGNOSIS — D6869 Other thrombophilia: Secondary | ICD-10-CM | POA: Insufficient documentation

## 2023-08-09 DIAGNOSIS — E119 Type 2 diabetes mellitus without complications: Secondary | ICD-10-CM | POA: Insufficient documentation

## 2023-08-09 DIAGNOSIS — I480 Paroxysmal atrial fibrillation: Secondary | ICD-10-CM | POA: Diagnosis not present

## 2023-08-09 DIAGNOSIS — Z79899 Other long term (current) drug therapy: Secondary | ICD-10-CM | POA: Diagnosis not present

## 2023-08-09 HISTORY — PX: CARDIOVERSION: EP1203

## 2023-08-09 SURGERY — CARDIOVERSION (CATH LAB)
Anesthesia: General

## 2023-08-09 MED ORDER — SODIUM CHLORIDE 0.9% FLUSH: 3.0000 mL | INTRAVENOUS | Status: DC | PRN

## 2023-08-09 MED ORDER — PROPOFOL 10 MG/ML IV BOLUS
INTRAVENOUS | Status: DC | PRN
  Administered 2023-08-09: 20 mg via INTRAVENOUS
  Administered 2023-08-09: 50 mg via INTRAVENOUS
  Administered 2023-08-09: 20 mg via INTRAVENOUS

## 2023-08-09 MED ORDER — LIDOCAINE 2% (20 MG/ML) 5 ML SYRINGE
INTRAMUSCULAR | Status: DC | PRN
  Administered 2023-08-09: 50 mg via INTRAVENOUS

## 2023-08-09 MED ORDER — SODIUM CHLORIDE 0.9% FLUSH: 3.0000 mL | Freq: Two times a day (BID) | INTRAVENOUS | Status: DC

## 2023-08-09 SURGICAL SUPPLY — 1 items: PAD DEFIB RADIO PHYSIO CONN (PAD) ×1 IMPLANT

## 2023-08-09 NOTE — Interval H&P Note (Signed)
 History and Physical Interval Note:  08/09/2023 7:44 AM  Dennis King  has presented today for surgery, with the diagnosis of AFIB.  The various methods of treatment have been discussed with the patient and family. After consideration of risks, benefits and other options for treatment, the patient has consented to  Procedure(s): CARDIOVERSION (N/A) as a surgical intervention.  The patient's history has been reviewed, patient examined, no change in status, stable for surgery.  I have reviewed the patient's chart and labs.  Questions were answered to the patient's satisfaction.     Dennis King

## 2023-08-09 NOTE — Anesthesia Postprocedure Evaluation (Signed)
 Anesthesia Post Note  Patient: Dennis King  Procedure(s) Performed: CARDIOVERSION     Patient location during evaluation: Cath Lab Anesthesia Type: General Level of consciousness: awake and alert Pain management: pain level controlled Vital Signs Assessment: post-procedure vital signs reviewed and stable Respiratory status: spontaneous breathing, nonlabored ventilation and respiratory function stable Cardiovascular status: blood pressure returned to baseline and stable Postop Assessment: no apparent nausea or vomiting Anesthetic complications: no   No notable events documented.  Last Vitals:  Vitals:   08/09/23 0840 08/09/23 0845  BP: 108/75 109/67  Pulse: 81 80  Resp: 17 16  Temp:    SpO2: 100% 99%    Last Pain:  Vitals:   08/09/23 0839  TempSrc:   PainSc: 0-No pain                 Taniah Reinecke

## 2023-08-09 NOTE — Transfer of Care (Signed)
 Immediate Anesthesia Transfer of Care Note  Patient: Dennis King  Procedure(s) Performed: CARDIOVERSION  Patient Location: PACU and Cath Lab  Anesthesia Type:General  Level of Consciousness: awake, drowsy, patient cooperative, and responds to stimulation  Airway & Oxygen Therapy: Patient Spontanous Breathing and Patient connected to face mask oxygen  Post-op Assessment: Report given to RN and Post -op Vital signs reviewed and stable  Post vital signs: Reviewed and stable  Last Vitals:  Vitals Value Taken Time  BP    Temp    Pulse    Resp    SpO2      Last Pain:  Vitals:   08/09/23 0720  TempSrc: Temporal         Complications: No notable events documented.

## 2023-08-09 NOTE — CV Procedure (Signed)
   Electrical Cardioversion Procedure Note Dennis King [id number] Dec 23, 1950  Procedure: Electrical Cardioversion Indications:  Atrial Fibrillation  Time Out: Verified patient identification, verified procedure,medications/allergies/relevent history reviewed, required imaging and test results available.  Performed  Procedure Details  The patient signed informed consent.   The patient was NPO past midnight. Has had therapeutic anticoagulation with Eliquis greater than 3 weeks. The patient denies any interruption of anticoagulation.  Anesthesia was administered by Dr. Maple Hudson.  Adequate airway was maintained throughout and vital followed per protocol.  He was cardioverted x 1 with 200J of biphasic synchronized energy.  He converted to NSR.  There were no apparent complications.  The patient tolerated the procedure well and had normal neuro status and respiratory status post procedure with vitals stable as recorded elsewhere.     IMPRESSION:  Successful cardioversion of atrial fibrillation   Follow up:  We will arrange follow up with primary cardiologist .  He will continue on current medical therapy.  The patient advised to continue anticoagulation.  Dennis King 08/09/2023, 8:36 AM

## 2023-08-09 NOTE — Anesthesia Preprocedure Evaluation (Signed)
 Anesthesia Evaluation  Patient identified by MRN, date of birth, ID band Patient awake    Reviewed: Allergy & Precautions, NPO status , Patient's Chart, lab work & pertinent test results  History of Anesthesia Complications Negative for: history of anesthetic complications  Airway Mallampati: III  TM Distance: >3 FB Neck ROM: Full    Dental  (+) Dental Advisory Given   Pulmonary neg shortness of breath, sleep apnea , neg COPD, neg recent URI   breath sounds clear to auscultation       Cardiovascular hypertension, Pt. on medications + CAD and + Past MI   Rhythm:Irregular   1. Left ventricular ejection fraction, by estimation, is 50 to 55%. The  left ventricle has low normal function. The left ventricle has no regional  wall motion abnormalities. Left ventricular diastolic function could not  be evaluated.   2. Right ventricular systolic function was not well visualized. The right  ventricular size is normal. Tricuspid regurgitation signal is inadequate  for assessing PA pressure.   3. The mitral valve is normal in structure. Trivial mitral valve  regurgitation. No evidence of mitral stenosis.   4. The aortic valve is calcified. There is mild calcification of the  aortic valve. There is moderate thickening of the aortic valve. Aortic  valve regurgitation is not visualized. Mild to moderate aortic valve  sclerosis/calcification is present, without  any evidence of aortic stenosis.   5. The inferior vena cava is dilated in size with >50% respiratory  variability, suggesting right atrial pressure of 8 mmHg.     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 Scoring balloon angioplasty was performed with excellent result and no visible dissection. There is a 10% residual stenosis post intervention.  Low normal LV systolic function.   Given his atrial fibrillation and need for anticoagulation, we elected to treat this ostial obtuse  marginal lesion with scoring balloon angioplasty only. The patient did have anginal symptoms with balloon inflations. There is an excellent angiographic result. Tirofiban to continue for a few hours. We started clopidogrel post procedure.  This could be continued for 2 weeks, since no stent was placed. I encouraged the patient to start Eliquis for stroke prevention. He is still deciding but I think he will agree eventually to take the Eliquis. Continue management of atrial fibrillation. Consider rhythm control.   Cardiology follow-up with Dr. Allyson Sabal.    Neuro/Psych negative neurological ROS  negative psych ROS   GI/Hepatic negative GI ROS, Neg liver ROS,,,  Endo/Other  diabetes    Renal/GU Lab Results      Component                Value               Date                      NA                       136                 08/02/2023                K                        4.2                 08/02/2023  CO2                      25                  08/02/2023                GLUCOSE                  213 (H)             08/02/2023                BUN                      23                  08/02/2023                CREATININE               1.09                08/02/2023                CALCIUM                  9.7                 08/02/2023                GFR                      77.56               01/20/2015                EGFR                     74                  04/24/2021                GFRNONAA                 >60                 08/02/2023                Musculoskeletal  (+) Arthritis ,    Abdominal   Peds  Hematology  (+) Blood dyscrasia Lab Results      Component                Value               Date                      WBC                      7.0                 08/02/2023                HGB                      14.6                08/02/2023  HCT                      44.0                08/02/2023                MCV                       95.0                08/02/2023                PLT                      221                 08/02/2023             eliquis   Anesthesia Other Findings   Reproductive/Obstetrics                              Anesthesia Physical Anesthesia Plan  ASA: 3  Anesthesia Plan: General   Post-op Pain Management: Minimal or no pain anticipated   Induction: Intravenous  PONV Risk Score and Plan: 2 and Treatment may vary due to age or medical condition  Airway Management Planned: Nasal Cannula, Natural Airway and Simple Face Mask  Additional Equipment: None  Intra-op Plan:   Post-operative Plan:   Informed Consent: I have reviewed the patients History and Physical, chart, labs and discussed the procedure including the risks, benefits and alternatives for the proposed anesthesia with the patient or authorized representative who has indicated his/her understanding and acceptance.       Plan Discussed with: CRNA  Anesthesia Plan Comments:          Anesthesia Quick Evaluation

## 2023-08-11 ENCOUNTER — Encounter: Payer: Self-pay | Admitting: Cardiology

## 2023-08-23 ENCOUNTER — Ambulatory Visit (HOSPITAL_COMMUNITY)
Admission: RE | Admit: 2023-08-23 | Discharge: 2023-08-23 | Disposition: A | Discharge status: Home / Self Care | Source: Ambulatory Visit | Attending: Physician Assistant | Admitting: Physician Assistant

## 2023-08-23 ENCOUNTER — Encounter (HOSPITAL_COMMUNITY): Payer: Self-pay | Admitting: Physician Assistant

## 2023-08-23 VITALS — BP 160/100 | HR 74 | Ht 71.0 in | Wt 238.8 lb

## 2023-08-23 DIAGNOSIS — D6869 Other thrombophilia: Secondary | ICD-10-CM | POA: Diagnosis not present

## 2023-08-23 DIAGNOSIS — I4819 Other persistent atrial fibrillation: Secondary | ICD-10-CM | POA: Diagnosis not present

## 2023-08-23 DIAGNOSIS — Z79899 Other long term (current) drug therapy: Secondary | ICD-10-CM | POA: Insufficient documentation

## 2023-08-23 DIAGNOSIS — I2510 Atherosclerotic heart disease of native coronary artery without angina pectoris: Secondary | ICD-10-CM | POA: Insufficient documentation

## 2023-08-23 DIAGNOSIS — Z7901 Long term (current) use of anticoagulants: Secondary | ICD-10-CM | POA: Insufficient documentation

## 2023-08-23 DIAGNOSIS — Z7985 Long-term (current) use of injectable non-insulin antidiabetic drugs: Secondary | ICD-10-CM | POA: Diagnosis not present

## 2023-08-23 DIAGNOSIS — Z7984 Long term (current) use of oral hypoglycemic drugs: Secondary | ICD-10-CM | POA: Diagnosis not present

## 2023-08-23 DIAGNOSIS — I10 Essential (primary) hypertension: Secondary | ICD-10-CM | POA: Diagnosis not present

## 2023-08-23 DIAGNOSIS — I252 Old myocardial infarction: Secondary | ICD-10-CM | POA: Insufficient documentation

## 2023-08-23 NOTE — Progress Notes (Signed)
 Primary Care Physician: Henrine Screws, MD Referring Physician: Dr. [name]  Primary EP: Dr Daine Gravel is a 73 y.o. male with a h/o persistent afib, HTN, OSA on CPAP, T2DM,  s/p ablation cardiomyopathy, CAD, that is in the afib clinic for admission for Tikosyn per Dr. [name]. He stopped Multaq and HCTZ on 9/28. He called the office yesterday as he was having some shortness of breath and fluid retention. He was given lasix/K+ yesterday and again this am and this helped with his symptoms.  He has not missed any anticoagulation for at least 3 weeks  and no benadryl use. He has cut back but continues with daily alcohol and caffeine use.   F/u in afib clinic, 03/25/21. He is one week out from [place] load. He had been feeling so much better and was surprised that he was back in afib this am. He was placed on spironolactone in the hospital and has lost 10 lbs. Is feeling much improved from  abdominal swelling and exertional shortness of breath.   F/u in afib clinic, 02/09/22 for Tikosyn surveillance. He reports having afib about every 2 weeks lasing around 12 hours. He has had 2 ablations in the past with Dr. [name]. He is complaint with tikosyn. We discussed today establishing with another EP in Dr. Jenel Lucks absence to see if a 3 rd ablation would be necessary and he is interested to proceed.   Follow up in the AF clinic 06/15/22. Patient reports that he has an episode of palpitations and fatigue once every 1-2 weeks. They typically last 2-3 hours but can be up to 12 hours. He was started on lisinopril HCTZ 3 days ago by his PCP. He is scheduled for an afib ablation 07/14/22.  Follow up in the AF clinic 08/26/22. Patient is now s/p ablation by Dr. Elberta Fortis on 07/14/22. He is doing great overall. He is in SR today and has not had any episodes of Afib since ablation. No chest pain, shortness of breath, or trouble swallowing. Leg sites healed without issue. He has been compliant with  anticoagulation and no bleeding concerns. He is interested in coming off Tikosyn and will discuss with Dr. Elberta Fortis at 3 month f/u.   Follow up 08/02/23. Patient returns for follow up for atrial fibrillation. He reports that he went back into afib on 07/27/23. He had symptoms of tachypalpitations, fatigue, and noted low BP. He has been using PRN diltiazem to slow his heart rate which has improved his symptoms. He denies any missed doses of anticoagulation.   Follow up 08/23/23. Patient returns for follow up for atrial fibrillation. He is s/p DCCV 08/09/23. He remains in SR today. He did have some bradycardia for a couple days post procedure with heart rate in the low 50s but was asymptomatic. No bleeding issues on anticoagulation.   Today, he  denies symptoms of palpitations, chest pain, shortness of breath, orthopnea, PND, lower extremity edema, dizziness, presyncope, syncope, snoring, daytime somnolence, bleeding, or neurologic sequela. The patient is tolerating medications without difficulties and is otherwise without complaint today.    Past Medical History:  Diagnosis Date   Arthritis    Coronary artery disease    past post circumflex obtuse marginal branch cutting balloon atherectomy by Dr. [name]   Family history of heart disease    Hyperlipidemia    Hypertension    Obesity    Obstructive sleep apnea    on C Pap   Paroxysmal A-fib (HCC)  Type 2 diabetes mellitus (HCC)     Current Outpatient Medications  Medication Sig Dispense Refill   apixaban (ELIQUIS) 5 MG TABS tablet TAKE ONE TABLET BY MOUTH TWICE DAILY 180 tablet 1   carvedilol (COREG) 25 MG tablet Take 25 mg by mouth 2 (two) times daily with a meal.      cholecalciferol (VITAMIN D) 25 MCG (1000 UNIT) tablet Take 1,000 Units by mouth every evening.     Continuous Blood Gluc Receiver (FREESTYLE LIBRE 2 READER) DEVI Use as directed to monitor blood glucose     Continuous Blood Gluc Sensor (FREESTYLE LIBRE 2 SENSOR) MISC Use as  directed every 14 days     diltiazem (CARDIZEM) 60 MG tablet TAKE ONE TABLET EVERY FOUR HOURS AS NEEDED FOR HEART RATE >100 45 tablet 1   Evolocumab (REPATHA SURECLICK) 140 MG/ML SOAJ INJECT ONE PEN INTO THE SKIN EVERY 14 DAYS 6 mL 3   INVOKANA 300 MG TABS tablet Take 300 mg by mouth daily.  4   Krill Oil 1000 MG CAPS Take 3,000 capsules by mouth every morning.     lisinopril (ZESTRIL) 20 MG tablet TAKE 1 TABLET BY MOUTH EVERY DAY 90 tablet 3   metFORMIN (GLUCOPHAGE) 1000 MG tablet Take 1,000 mg by mouth 2 (two) times daily with a meal.      Multiple Vitamin (MULTIVITAMIN WITH MINERALS) TABS tablet Take 1 tablet by mouth daily.     nitroGLYCERIN (NITROSTAT) 0.4 MG SL tablet Place 1 tablet (0.4 mg total) under the tongue every 5 (five) minutes as needed for chest pain. 25 tablet 4   NON FORMULARY 1 Dose by Other route at bedtime. Cpap 8cm H2O     NOVOLOG MIX 70/30 (70-30) 100 UNIT/ML injection Inject 10-20 Units into the skin 2 (two) times daily with a meal. If blood glucose is 100-130= 10 Take 20 units above 130 Sliding scale  4   OZEMPIC, 0.25 OR 0.5 MG/DOSE, 2 MG/3ML SOPN Inject 1 mg into the skin once a week.     sildenafil (REVATIO) 20 MG tablet Take 20 mg by mouth daily as needed (ED). Taking 2-5 tablets by mouth as needed     No current facility-administered medications for this encounter.    ROS- All systems are reviewed and negative except as per the HPI above  Physical Exam: Vitals:   08/23/23 0937  BP: (!) 160/100  Pulse: 74  Weight: 108.3 kg  Height: 5\' 11"  (1.803 m)     Wt Readings from Last 3 Encounters:  08/23/23 108.3 kg  08/09/23 108 kg  08/02/23 108.4 kg    GEN: Well nourished, well developed in no acute distress CARDIAC: Regular rate and rhythm, no murmurs, rubs, gallops RESPIRATORY:  Clear to auscultation without rales, wheezing or rhonchi  ABDOMEN: Soft, non-tender, non-distended EXTREMITIES:  No edema; No deformity     EKG today  demonstrates SR Vent. rate 74 BPM PR interval 156 ms QRS duration 88 ms QT/QTcB 376/417 ms   Echo 04/07/20 1. Left ventricular ejection fraction, by estimation, is 50 to 55%. The  left ventricle has low normal function. The left ventricle has no regional wall motion abnormalities. Left ventricular diastolic function could not be evaluated.   2. Right ventricular systolic function was not well visualized. The right ventricular size is normal. Tricuspid regurgitation signal is inadequate for assessing PA pressure.   3. The mitral valve is normal in structure. Trivial mitral valve  regurgitation. No evidence of mitral stenosis.   4.  The aortic valve is calcified. There is mild calcification of the  aortic valve. There is moderate thickening of the aortic valve. Aortic  valve regurgitation is not visualized. Mild to moderate aortic valve  sclerosis/calcification is present, without  any evidence of aortic stenosis.   5. The inferior vena cava is dilated in size with >50% respiratory  variability, suggesting right atrial pressure of 8 mmHg.    CHA2DS2-VASc Score = 4  The patient's score is based upon: CHF History: 0 HTN History: 1 Diabetes History: 1 Stroke History: 0 Vascular Disease History: 1 Age Score: 1 Gender Score: 0       ASSESSMENT AND PLAN: Persistent Atrial Fibrillation (ICD10:  I48.19) The patient's CHA2DS2-VASc score is 4, indicating a 4.8% annual risk of stroke.   S/p afib ablation 2016, 2021, 07/14/22. Off dofetilide S/p DCCV 08/09/23 Patient appears to be maintaining SR  Continue carvedilol 25 mg BID Continue Eliquis 5 mg BID Continue diltiazem 60 mg PRN q 4 hours for heart racing  Secondary Hypercoagulable State (ICD10:  D68.69) The patient is at significant risk for stroke/thromboembolism based upon his CHA2DS2-VASc Score of 4.  Continue Apixaban (Eliquis). No bleeding issues.   HTN Stable on current regimen  CAD NSTEMI 2016 CAC score 1857 on CT No  anginal symptoms Referred to the [facility] at previous visit.     Follow up in the AF clinic in 6 months.     Jorja Loa PA-C Afib Clinic [facility] [address] [place], [place] [address] [phone]

## 2023-08-24 ENCOUNTER — Encounter (HOSPITAL_BASED_OUTPATIENT_CLINIC_OR_DEPARTMENT_OTHER): Payer: Self-pay

## 2023-10-11 ENCOUNTER — Ambulatory Visit (HOSPITAL_COMMUNITY): Payer: PPO | Admitting: Physician Assistant

## 2023-12-30 ENCOUNTER — Telehealth: Payer: Self-pay | Admitting: Cardiology

## 2023-12-30 NOTE — Telephone Encounter (Signed)
 Spoke to patient he has been having afib today.Rate is not fast enough to take a diltiazem .Advised office is presently closed.He will call Afib clinic on Monday 8/11 and schedule appointment with [facility].Advised if Afib worsens he will need to go to ED.I will make make Dr.Camnitz's RN aware.

## 2023-12-30 NOTE — Telephone Encounter (Signed)
 Per patient schedule message:  I have begun fibrillating as of two days ago and would like to schedule a conversion if this seems appropriate.

## 2024-01-02 ENCOUNTER — Encounter (HOSPITAL_COMMUNITY): Payer: Self-pay | Admitting: Physician Assistant

## 2024-01-02 ENCOUNTER — Ambulatory Visit (HOSPITAL_COMMUNITY)
Admission: RE | Admit: 2024-01-02 | Discharge: 2024-01-02 | Disposition: A | Discharge status: Home / Self Care | Source: Ambulatory Visit | Attending: Physician Assistant | Admitting: Physician Assistant

## 2024-01-02 VITALS — BP 116/70 | HR 90 | Ht 71.0 in | Wt 236.8 lb

## 2024-01-02 DIAGNOSIS — I214 Non-ST elevation (NSTEMI) myocardial infarction: Secondary | ICD-10-CM

## 2024-01-02 DIAGNOSIS — D6869 Other thrombophilia: Secondary | ICD-10-CM | POA: Diagnosis not present

## 2024-01-02 DIAGNOSIS — I2510 Atherosclerotic heart disease of native coronary artery without angina pectoris: Secondary | ICD-10-CM

## 2024-01-02 DIAGNOSIS — Z9861 Coronary angioplasty status: Secondary | ICD-10-CM | POA: Diagnosis not present

## 2024-01-02 DIAGNOSIS — I4819 Other persistent atrial fibrillation: Secondary | ICD-10-CM | POA: Diagnosis not present

## 2024-01-02 DIAGNOSIS — I10 Essential (primary) hypertension: Secondary | ICD-10-CM

## 2024-01-02 NOTE — Progress Notes (Signed)
 Primary Care Physician: Frederik Charleston, MD Referring Physician: Dr. Kelsie  Primary EP: Dr Inocencio Charleston Dennis King is a 73 y.o. male with a h/o persistent afib, HTN, OSA on CPAP, T2DM,  s/p ablation cardiomyopathy, CAD, that is in the afib clinic for admission for Tikosyn  per Dr. Kelsie. He stopped Multaq  and HCTZ on 9/28. He called the office yesterday as he was having some shortness of breath and fluid retention. He was given lasix /K+ yesterday and again this am and this helped with his symptoms.  He has not missed any anticoagulation for at least 3 weeks  and no benadryl use. He has cut back but continues with daily alcohol and caffeine use.   F/u in afib clinic, 03/25/21. He is one week out from tikosyn  load. He had been feeling so much better and was surprised that he was back in afib this am. He was placed on spironolactone  in the hospital and has lost 10 lbs. Is feeling much improved from  abdominal swelling and exertional shortness of breath.   F/u in afib clinic, 02/09/22 for Tikosyn  surveillance. He reports having afib about every 2 weeks lasing around 12 hours. He has had 2 ablations in the past with Dr. Kelsie. He is complaint with tikosyn . We discussed today establishing with another EP in Dr. Merrick absence to see if a 3 rd ablation would be necessary and he is interested to proceed.   Follow up in the AF clinic 06/15/22. Patient reports that he has an episode of palpitations and fatigue once every 1-2 weeks. They typically last 2-3 hours but can be up to 12 hours. He was started on lisinopril  HCTZ 3 days ago by his PCP. He is scheduled for an afib ablation 07/14/22.  Follow up in the AF clinic 08/26/22. Patient is now s/p ablation by Dr. Inocencio on 07/14/22. He is doing great overall. He is in SR today and has not had any episodes of Afib since ablation. No chest pain, shortness of breath, or trouble swallowing. Leg sites healed without issue. He has been compliant with  anticoagulation and no bleeding concerns. He is interested in coming off Tikosyn  and will discuss with Dr. Inocencio at 3 month f/u.   Follow up 08/02/23. Patient returns for follow up for atrial fibrillation. He reports that he went back into afib on 07/27/23. He had symptoms of tachypalpitations, fatigue, and noted low BP. He has been using PRN diltiazem  to slow his heart rate which has improved his symptoms. He denies any missed doses of anticoagulation.   Follow up 08/23/23. Patient returns for follow up for atrial fibrillation. He is s/p DCCV 08/09/23. He remains in SR today. He did have some bradycardia for a couple days post procedure with heart rate in the low 50s but was asymptomatic. No bleeding issues on anticoagulation.   Follow up 01/02/24. Patient returns for follow up for atrial fibrillation. He states that he went back into afib this past Wednesday. He was out of town on vacation at the time. He has symptoms of fatigue. No bleeding issues on anticoagulation.   Today, he  denies symptoms of palpitations, chest pain, shortness of breath, orthopnea, PND, lower extremity edema, dizziness, presyncope, syncope, snoring, daytime somnolence, bleeding, or neurologic sequela. The patient is tolerating medications without difficulties and is otherwise without complaint today.    Past Medical History:  Diagnosis Date   Arthritis    Coronary artery disease    past post circumflex obtuse marginal branch  cutting balloon atherectomy by Dr. Dann   Family history of heart disease    Hyperlipidemia    Hypertension    Obesity    Obstructive sleep apnea    on C Pap   Paroxysmal A-fib (HCC)    Type 2 diabetes mellitus (HCC)     Current Outpatient Medications  Medication Sig Dispense Refill   apixaban  (ELIQUIS ) 5 MG TABS tablet TAKE ONE TABLET BY MOUTH TWICE DAILY 180 tablet 1   carvedilol  (COREG ) 25 MG tablet Take 25 mg by mouth 2 (two) times daily with a meal.      cholecalciferol (VITAMIN D) 25  MCG (1000 UNIT) tablet Take 1,000 Units by mouth every evening.     Continuous Blood Gluc Receiver (FREESTYLE LIBRE 2 READER) DEVI Use as directed to monitor blood glucose     Continuous Blood Gluc Sensor (FREESTYLE LIBRE 2 SENSOR) MISC Use as directed every 14 days     diltiazem  (CARDIZEM ) 60 MG tablet TAKE ONE TABLET EVERY FOUR HOURS AS NEEDED FOR HEART RATE >100 45 tablet 1   Evolocumab  (REPATHA  SURECLICK) 140 MG/ML SOAJ INJECT ONE PEN INTO THE SKIN EVERY 14 DAYS 6 mL 3   INVOKANA  300 MG TABS tablet Take 300 mg by mouth daily.  4   Krill Oil 1000 MG CAPS Take 3,000 capsules by mouth every morning.     lisinopril  (ZESTRIL ) 20 MG tablet TAKE 1 TABLET BY MOUTH EVERY DAY 90 tablet 3   metFORMIN  (GLUCOPHAGE ) 1000 MG tablet Take 1,000 mg by mouth 2 (two) times daily with a meal.      Multiple Vitamin (MULTIVITAMIN WITH MINERALS) TABS tablet Take 1 tablet by mouth daily.     nitroGLYCERIN  (NITROSTAT ) 0.4 MG SL tablet Place 1 tablet (0.4 mg total) under the tongue every 5 (five) minutes as needed for chest pain. 25 tablet 4   NON FORMULARY 1 Dose by Other route at bedtime. Cpap 8cm H2O     NOVOLOG  MIX 70/30 (70-30) 100 UNIT/ML injection Inject 10-20 Units into the skin 2 (two) times daily with a meal. If blood glucose is 100-130= 10 Take 20 units above 130 Sliding scale  4   OZEMPIC, 0.25 OR 0.5 MG/DOSE, 2 MG/3ML SOPN Inject 1 mg into the skin once a week.     sildenafil (REVATIO) 20 MG tablet Take 20 mg by mouth daily as needed (ED). Taking 2-5 tablets by mouth as needed     No current facility-administered medications for this encounter.    ROS- All systems are reviewed and negative except as per the HPI above  Physical Exam: Vitals:   01/02/24 1501  BP: 116/70  Pulse: 90  Weight: 107.4 kg  Height: 5' 11 (1.803 m)    Wt Readings from Last 3 Encounters:  01/02/24 107.4 kg  08/23/23 108.3 kg  08/09/23 108 kg    GEN: Well nourished, well developed in no acute distress NECK: No  JVD CARDIAC: Irregularly irregular rate and rhythm, no murmurs, rubs, gallops RESPIRATORY:  Clear to auscultation without rales, wheezing or rhonchi  ABDOMEN: Soft, non-tender, non-distended EXTREMITIES:  No edema; No deformity    EKG today demonstrates Afib Vent. rate 90 BPM PR interval * ms QRS duration 90 ms QT/QTcB 352/430 ms   Echo 04/07/20 1. Left ventricular ejection fraction, by estimation, is 50 to 55%. The  left ventricle has low normal function. The left ventricle has no regional wall motion abnormalities. Left ventricular diastolic function could not be evaluated.   2.  Right ventricular systolic function was not well visualized. The right ventricular size is normal. Tricuspid regurgitation signal is inadequate for assessing PA pressure.   3. The mitral valve is normal in structure. Trivial mitral valve  regurgitation. No evidence of mitral stenosis.   4. The aortic valve is calcified. There is mild calcification of the  aortic valve. There is moderate thickening of the aortic valve. Aortic  valve regurgitation is not visualized. Mild to moderate aortic valve  sclerosis/calcification is present, without  any evidence of aortic stenosis.   5. The inferior vena cava is dilated in size with >50% respiratory  variability, suggesting right atrial pressure of 8 mmHg.    CHA2DS2-VASc Score = 4  The patient's score is based upon: CHF History: 0 HTN History: 1 Diabetes History: 1 Stroke History: 0 Vascular Disease History: 1 Age Score: 1 Gender Score: 0       ASSESSMENT AND PLAN: Persistent Atrial Fibrillation (ICD10:  I48.19) The patient's CHA2DS2-VASc score is 4, indicating a 4.8% annual risk of stroke.   S/p afib ablation 2016, 2021, 07/14/22 Off dofetilide  Patient back in persistent afib. We discussed rhythm control options including DCCV, resuming dofetilide , amiodarone, or surgical ablation. He would like to try DCCV alone. Check bmet/cbc. If he has recurrence,  he would like to be referred for a mini-MAZE. His brother developed pulmonary fibrosis on amiodarone, he understandably does not want to consider this option.  Continue carvedilol  25 mg BID Continue Eliquis  5 mg BID Continue diltiazem  60 mg q 4 hours PRN for heart racing.   Secondary Hypercoagulable State (ICD10:  D68.69) The patient is at significant risk for stroke/thromboembolism based upon his CHA2DS2-VASc Score of 4.  Continue Apixaban  (Eliquis ). No bleeding issues.   HTN Stable on current regimen  CAD NSTEMI 2016 CAC score 1857 No anginal symptoms Will refer back to establish care with Dr Court.     Follow up in the AF clinic post DCCV.    Informed Consent   Shared Decision Making/Informed Consent The risks (stroke, cardiac arrhythmias rarely resulting in the need for a temporary or permanent pacemaker, skin irritation or burns and complications associated with conscious sedation including aspiration, arrhythmia, respiratory failure and death), benefits (restoration of normal sinus rhythm) and alternatives of a direct current cardioversion were explained in detail to Dr. Harles and he agrees to proceed.       Daril Kicks PA-C Afib Clinic [facility] [address] [place], [place] [address] [phone]

## 2024-01-02 NOTE — H&P (View-Only) (Signed)
 Primary Care Physician: Frederik Charleston, MD Referring Physician: Dr. Kelsie  Primary EP: Dr Inocencio Charleston Dennis King is a 73 y.o. male with a h/o persistent afib, HTN, OSA on CPAP, T2DM,  s/p ablation cardiomyopathy, CAD, that is in the afib clinic for admission for Tikosyn  per Dr. Kelsie. He stopped Multaq  and HCTZ on 9/28. He called the office yesterday as he was having some shortness of breath and fluid retention. He was given lasix /K+ yesterday and again this am and this helped with his symptoms.  He has not missed any anticoagulation for at least 3 weeks  and no benadryl use. He has cut back but continues with daily alcohol and caffeine use.   F/u in afib clinic, 03/25/21. He is one week out from tikosyn  load. He had been feeling so much better and was surprised that he was back in afib this am. He was placed on spironolactone  in the hospital and has lost 10 lbs. Is feeling much improved from  abdominal swelling and exertional shortness of breath.   F/u in afib clinic, 02/09/22 for Tikosyn  surveillance. He reports having afib about every 2 weeks lasing around 12 hours. He has had 2 ablations in the past with Dr. Kelsie. He is complaint with tikosyn . We discussed today establishing with another EP in Dr. Merrick absence to see if a 3 rd ablation would be necessary and he is interested to proceed.   Follow up in the AF clinic 06/15/22. Patient reports that he has an episode of palpitations and fatigue once every 1-2 weeks. They typically last 2-3 hours but can be up to 12 hours. He was started on lisinopril  HCTZ 3 days ago by his PCP. He is scheduled for an afib ablation 07/14/22.  Follow up in the AF clinic 08/26/22. Patient is now s/p ablation by Dr. Inocencio on 07/14/22. He is doing great overall. He is in SR today and has not had any episodes of Afib since ablation. No chest pain, shortness of breath, or trouble swallowing. Leg sites healed without issue. He has been compliant with  anticoagulation and no bleeding concerns. He is interested in coming off Tikosyn  and will discuss with Dr. Inocencio at 3 month f/u.   Follow up 08/02/23. Patient returns for follow up for atrial fibrillation. He reports that he went back into afib on 07/27/23. He had symptoms of tachypalpitations, fatigue, and noted low BP. He has been using PRN diltiazem  to slow his heart rate which has improved his symptoms. He denies any missed doses of anticoagulation.   Follow up 08/23/23. Patient returns for follow up for atrial fibrillation. He is s/p DCCV 08/09/23. He remains in SR today. He did have some bradycardia for a couple days post procedure with heart rate in the low 50s but was asymptomatic. No bleeding issues on anticoagulation.   Follow up 01/02/24. Patient returns for follow up for atrial fibrillation. He states that he went back into afib this past Wednesday. He was out of town on vacation at the time. He has symptoms of fatigue. No bleeding issues on anticoagulation.   Today, he  denies symptoms of palpitations, chest pain, shortness of breath, orthopnea, PND, lower extremity edema, dizziness, presyncope, syncope, snoring, daytime somnolence, bleeding, or neurologic sequela. The patient is tolerating medications without difficulties and is otherwise without complaint today.    Past Medical History:  Diagnosis Date   Arthritis    Coronary artery disease    past post circumflex obtuse marginal branch  cutting balloon atherectomy by Dr. Dann   Family history of heart disease    Hyperlipidemia    Hypertension    Obesity    Obstructive sleep apnea    on C Pap   Paroxysmal A-fib (HCC)    Type 2 diabetes mellitus (HCC)     Current Outpatient Medications  Medication Sig Dispense Refill   apixaban  (ELIQUIS ) 5 MG TABS tablet TAKE ONE TABLET BY MOUTH TWICE DAILY 180 tablet 1   carvedilol  (COREG ) 25 MG tablet Take 25 mg by mouth 2 (two) times daily with a meal.      cholecalciferol (VITAMIN D) 25  MCG (1000 UNIT) tablet Take 1,000 Units by mouth every evening.     Continuous Blood Gluc Receiver (FREESTYLE LIBRE 2 READER) DEVI Use as directed to monitor blood glucose     Continuous Blood Gluc Sensor (FREESTYLE LIBRE 2 SENSOR) MISC Use as directed every 14 days     diltiazem  (CARDIZEM ) 60 MG tablet TAKE ONE TABLET EVERY FOUR HOURS AS NEEDED FOR HEART RATE >100 45 tablet 1   Evolocumab  (REPATHA  SURECLICK) 140 MG/ML SOAJ INJECT ONE PEN INTO THE SKIN EVERY 14 DAYS 6 mL 3   INVOKANA  300 MG TABS tablet Take 300 mg by mouth daily.  4   Krill Oil 1000 MG CAPS Take 3,000 capsules by mouth every morning.     lisinopril  (ZESTRIL ) 20 MG tablet TAKE 1 TABLET BY MOUTH EVERY DAY 90 tablet 3   metFORMIN  (GLUCOPHAGE ) 1000 MG tablet Take 1,000 mg by mouth 2 (two) times daily with a meal.      Multiple Vitamin (MULTIVITAMIN WITH MINERALS) TABS tablet Take 1 tablet by mouth daily.     nitroGLYCERIN  (NITROSTAT ) 0.4 MG SL tablet Place 1 tablet (0.4 mg total) under the tongue every 5 (five) minutes as needed for chest pain. 25 tablet 4   NON FORMULARY 1 Dose by Other route at bedtime. Cpap 8cm H2O     NOVOLOG  MIX 70/30 (70-30) 100 UNIT/ML injection Inject 10-20 Units into the skin 2 (two) times daily with a meal. If blood glucose is 100-130= 10 Take 20 units above 130 Sliding scale  4   OZEMPIC, 0.25 OR 0.5 MG/DOSE, 2 MG/3ML SOPN Inject 1 mg into the skin once a week.     sildenafil (REVATIO) 20 MG tablet Take 20 mg by mouth daily as needed (ED). Taking 2-5 tablets by mouth as needed     No current facility-administered medications for this encounter.    ROS- All systems are reviewed and negative except as per the HPI above  Physical Exam: Vitals:   01/02/24 1501  BP: 116/70  Pulse: 90  Weight: 107.4 kg  Height: 5' 11 (1.803 m)    Wt Readings from Last 3 Encounters:  01/02/24 107.4 kg  08/23/23 108.3 kg  08/09/23 108 kg    GEN: Well nourished, well developed in no acute distress NECK: No  JVD CARDIAC: Irregularly irregular rate and rhythm, no murmurs, rubs, gallops RESPIRATORY:  Clear to auscultation without rales, wheezing or rhonchi  ABDOMEN: Soft, non-tender, non-distended EXTREMITIES:  No edema; No deformity    EKG today demonstrates Afib Vent. rate 90 BPM PR interval * ms QRS duration 90 ms QT/QTcB 352/430 ms   Echo 04/07/20 1. Left ventricular ejection fraction, by estimation, is 50 to 55%. The  left ventricle has low normal function. The left ventricle has no regional wall motion abnormalities. Left ventricular diastolic function could not be evaluated.   2.  Right ventricular systolic function was not well visualized. The right ventricular size is normal. Tricuspid regurgitation signal is inadequate for assessing PA pressure.   3. The mitral valve is normal in structure. Trivial mitral valve  regurgitation. No evidence of mitral stenosis.   4. The aortic valve is calcified. There is mild calcification of the  aortic valve. There is moderate thickening of the aortic valve. Aortic  valve regurgitation is not visualized. Mild to moderate aortic valve  sclerosis/calcification is present, without  any evidence of aortic stenosis.   5. The inferior vena cava is dilated in size with >50% respiratory  variability, suggesting right atrial pressure of 8 mmHg.    CHA2DS2-VASc Score = 4  The patient's score is based upon: CHF History: 0 HTN History: 1 Diabetes History: 1 Stroke History: 0 Vascular Disease History: 1 Age Score: 1 Gender Score: 0       ASSESSMENT AND PLAN: Persistent Atrial Fibrillation (ICD10:  I48.19) The patient's CHA2DS2-VASc score is 4, indicating a 4.8% annual risk of stroke.   S/p afib ablation 2016, 2021, 07/14/22 Off dofetilide  Patient back in persistent afib. We discussed rhythm control options including DCCV, resuming dofetilide , amiodarone, or surgical ablation. He would like to try DCCV alone. Check bmet/cbc. If he has recurrence,  he would like to be referred for a mini-MAZE. His brother developed pulmonary fibrosis on amiodarone, he understandably does not want to consider this option.  Continue carvedilol  25 mg BID Continue Eliquis  5 mg BID Continue diltiazem  60 mg q 4 hours PRN for heart racing.   Secondary Hypercoagulable State (ICD10:  D68.69) The patient is at significant risk for stroke/thromboembolism based upon his CHA2DS2-VASc Score of 4.  Continue Apixaban  (Eliquis ). No bleeding issues.   HTN Stable on current regimen  CAD NSTEMI 2016 CAC score 1857 No anginal symptoms Will refer back to establish care with Dr Court.     Follow up in the AF clinic post DCCV.    Informed Consent   Shared Decision Making/Informed Consent The risks (stroke, cardiac arrhythmias rarely resulting in the need for a temporary or permanent pacemaker, skin irritation or burns and complications associated with conscious sedation including aspiration, arrhythmia, respiratory failure and death), benefits (restoration of normal sinus rhythm) and alternatives of a direct current cardioversion were explained in detail to Dr. Harles and he agrees to proceed.       Daril Kicks PA-C [facility] [address] [place], [place] [address] [phone]

## 2024-01-02 NOTE — Progress Notes (Signed)
 Primary Care Physician: Frederik Charleston, MD Referring Physician: Dr. Kelsie  Primary EP: Dr Inocencio Charleston Dennis King is a 73 y.o. male with a h/o persistent afib, HTN, OSA on CPAP, T2DM,  s/p ablation cardiomyopathy, CAD, that is in the afib clinic for admission for Tikosyn  per Dr. Kelsie. He stopped Multaq  and HCTZ on 9/28. He called the office yesterday as he was having some shortness of breath and fluid retention. He was given lasix /K+ yesterday and again this am and this helped with his symptoms.  He has not missed any anticoagulation for at least 3 weeks  and no benadryl use. He has cut back but continues with daily alcohol and caffeine use.   F/u in afib clinic, 03/25/21. He is one week out from tikosyn  load. He had been feeling so much better and was surprised that he was back in afib this am. He was placed on spironolactone  in the hospital and has lost 10 lbs. Is feeling much improved from  abdominal swelling and exertional shortness of breath.   F/u in afib clinic, 02/09/22 for Tikosyn  surveillance. He reports having afib about every 2 weeks lasing around 12 hours. He has had 2 ablations in the past with Dr. Kelsie. He is complaint with tikosyn . We discussed today establishing with another EP in Dr. Merrick absence to see if a 3 rd ablation would be necessary and he is interested to proceed.   Follow up in the AF clinic 06/15/22. Patient reports that he has an episode of palpitations and fatigue once every 1-2 weeks. They typically last 2-3 hours but can be up to 12 hours. He was started on lisinopril  HCTZ 3 days ago by his PCP. He is scheduled for an afib ablation 07/14/22.  Follow up in the AF clinic 08/26/22. Patient is now s/p ablation by Dr. Inocencio on 07/14/22. He is doing great overall. He is in SR today and has not had any episodes of Afib since ablation. No chest pain, shortness of breath, or trouble swallowing. Leg sites healed without issue. He has been compliant with  anticoagulation and no bleeding concerns. He is interested in coming off Tikosyn  and will discuss with Dr. Inocencio at 3 month f/u.   Follow up 08/02/23. Patient returns for follow up for atrial fibrillation. He reports that he went back into afib on 07/27/23. He had symptoms of tachypalpitations, fatigue, and noted low BP. He has been using PRN diltiazem  to slow his heart rate which has improved his symptoms. He denies any missed doses of anticoagulation.   Follow up 08/23/23. Patient returns for follow up for atrial fibrillation. He is s/p DCCV 08/09/23. He remains in SR today. He did have some bradycardia for a couple days post procedure with heart rate in the low 50s but was asymptomatic. No bleeding issues on anticoagulation.   Follow up 01/02/24. Patient returns for follow up for atrial fibrillation. He states that he went back into afib this past Wednesday. He was out of town on vacation at the time. He has symptoms of fatigue. No bleeding issues on anticoagulation.   Today, he  denies symptoms of palpitations, chest pain, shortness of breath, orthopnea, PND, lower extremity edema, dizziness, presyncope, syncope, snoring, daytime somnolence, bleeding, or neurologic sequela. The patient is tolerating medications without difficulties and is otherwise without complaint today.    Past Medical History:  Diagnosis Date   Arthritis    Coronary artery disease    past post circumflex obtuse marginal branch  cutting balloon atherectomy by Dr. Dann   Family history of heart disease    Hyperlipidemia    Hypertension    Obesity    Obstructive sleep apnea    on C Pap   Paroxysmal A-fib (HCC)    Type 2 diabetes mellitus (HCC)     Current Outpatient Medications  Medication Sig Dispense Refill   apixaban  (ELIQUIS ) 5 MG TABS tablet TAKE ONE TABLET BY MOUTH TWICE DAILY 180 tablet 1   carvedilol  (COREG ) 25 MG tablet Take 25 mg by mouth 2 (two) times daily with a meal.      cholecalciferol (VITAMIN D) 25  MCG (1000 UNIT) tablet Take 1,000 Units by mouth every evening.     Continuous Blood Gluc Receiver (FREESTYLE LIBRE 2 READER) DEVI Use as directed to monitor blood glucose     Continuous Blood Gluc Sensor (FREESTYLE LIBRE 2 SENSOR) MISC Use as directed every 14 days     diltiazem  (CARDIZEM ) 60 MG tablet TAKE ONE TABLET EVERY FOUR HOURS AS NEEDED FOR HEART RATE >100 45 tablet 1   Evolocumab  (REPATHA  SURECLICK) 140 MG/ML SOAJ INJECT ONE PEN INTO THE SKIN EVERY 14 DAYS 6 mL 3   INVOKANA  300 MG TABS tablet Take 300 mg by mouth daily.  4   Krill Oil 1000 MG CAPS Take 3,000 capsules by mouth every morning.     lisinopril  (ZESTRIL ) 20 MG tablet TAKE 1 TABLET BY MOUTH EVERY DAY 90 tablet 3   metFORMIN  (GLUCOPHAGE ) 1000 MG tablet Take 1,000 mg by mouth 2 (two) times daily with a meal.      Multiple Vitamin (MULTIVITAMIN WITH MINERALS) TABS tablet Take 1 tablet by mouth daily.     nitroGLYCERIN  (NITROSTAT ) 0.4 MG SL tablet Place 1 tablet (0.4 mg total) under the tongue every 5 (five) minutes as needed for chest pain. 25 tablet 4   NON FORMULARY 1 Dose by Other route at bedtime. Cpap 8cm H2O     NOVOLOG  MIX 70/30 (70-30) 100 UNIT/ML injection Inject 10-20 Units into the skin 2 (two) times daily with a meal. If blood glucose is 100-130= 10 Take 20 units above 130 Sliding scale  4   OZEMPIC, 0.25 OR 0.5 MG/DOSE, 2 MG/3ML SOPN Inject 1 mg into the skin once a week.     sildenafil (REVATIO) 20 MG tablet Take 20 mg by mouth daily as needed (ED). Taking 2-5 tablets by mouth as needed     No current facility-administered medications for this encounter.    ROS- All systems are reviewed and negative except as per the HPI above  Physical Exam: Vitals:   01/02/24 1501  BP: 116/70  Pulse: 90  Weight: 107.4 kg  Height: 5' 11 (1.803 m)    Wt Readings from Last 3 Encounters:  01/02/24 107.4 kg  08/23/23 108.3 kg  08/09/23 108 kg    GEN: Well nourished, well developed in no acute distress NECK: No  JVD CARDIAC: Irregularly irregular rate and rhythm, no murmurs, rubs, gallops RESPIRATORY:  Clear to auscultation without rales, wheezing or rhonchi  ABDOMEN: Soft, non-tender, non-distended EXTREMITIES:  No edema; No deformity    EKG today demonstrates Afib Vent. rate 90 BPM PR interval * ms QRS duration 90 ms QT/QTcB 352/430 ms   Echo 04/07/20 1. Left ventricular ejection fraction, by estimation, is 50 to 55%. The  left ventricle has low normal function. The left ventricle has no regional wall motion abnormalities. Left ventricular diastolic function could not be evaluated.   2.  Right ventricular systolic function was not well visualized. The right ventricular size is normal. Tricuspid regurgitation signal is inadequate for assessing PA pressure.   3. The mitral valve is normal in structure. Trivial mitral valve  regurgitation. No evidence of mitral stenosis.   4. The aortic valve is calcified. There is mild calcification of the  aortic valve. There is moderate thickening of the aortic valve. Aortic  valve regurgitation is not visualized. Mild to moderate aortic valve  sclerosis/calcification is present, without  any evidence of aortic stenosis.   5. The inferior vena cava is dilated in size with >50% respiratory  variability, suggesting right atrial pressure of 8 mmHg.    CHA2DS2-VASc Score = 4  The patient's score is based upon: CHF History: 0 HTN History: 1 Diabetes History: 1 Stroke History: 0 Vascular Disease History: 1 Age Score: 1 Gender Score: 0       ASSESSMENT AND PLAN: Persistent Atrial Fibrillation (ICD10:  I48.19) The patient's CHA2DS2-VASc score is 4, indicating a 4.8% annual risk of stroke.   S/p afib ablation 2016, 2021, 07/14/22 Off dofetilide  Patient back in persistent afib. We discussed rhythm control options including DCCV, resuming dofetilide , amiodarone, or surgical ablation. He would like to try DCCV alone. Check bmet/cbc. If he has recurrence,  he would like to be referred for a mini-MAZE. His brother developed pulmonary fibrosis on amiodarone, he understandably does not want to consider this option.  Continue carvedilol  25 mg BID Continue Eliquis  5 mg BID Continue diltiazem  60 mg q 4 hours PRN for heart racing.   Secondary Hypercoagulable State (ICD10:  D68.69) The patient is at significant risk for stroke/thromboembolism based upon his CHA2DS2-VASc Score of 4.  Continue Apixaban  (Eliquis ). No bleeding issues.   HTN Stable on current regimen  CAD NSTEMI 2016 CAC score 1857 No anginal symptoms Will refer back to establish care with Dr Court.     Follow up in the AF clinic post DCCV.    Informed Consent   Shared Decision Making/Informed Consent The risks (stroke, cardiac arrhythmias rarely resulting in the need for a temporary or permanent pacemaker, skin irritation or burns and complications associated with conscious sedation including aspiration, arrhythmia, respiratory failure and death), benefits (restoration of normal sinus rhythm) and alternatives of a direct current cardioversion were explained in detail to Dr. Harles and he agrees to proceed.       Daril Kicks PA-C Afib Clinic [facility] [address] [place], [place] [address] [phone]

## 2024-01-02 NOTE — Patient Instructions (Signed)
 Cardioversion scheduled for: Friday, August 15th   - Arrive at the [facility] ([address])  and check in with ADMITTING at 7:30 AM    - Do not eat or drink anything after midnight the night prior to your procedure.   - Take all your morning medication (except diabetic medications) with a sip of water prior to arrival.  - Do NOT miss any doses of your blood thinner - if you should miss a dose or take a dose more than 4 hours late -- please notify our office immediately.  - You will not be able to drive home after your procedure. Please ensure you have a responsible adult to drive you home. You will need someone with you for 24 hours post procedure.     - Expect to be in the procedural area approximately 2 hours.   - If you feel as if you go back into normal rhythm prior to scheduled cardioversion, please notify our office immediately.   If your procedure is canceled in the cardioversion suite you will be charged a cancellation fee.    Hold below medications 7 days prior to scheduled procedure/anesthesia.  Restart medication on the normal dosing day after scheduled procedure/anesthesia  Semaglutide (Ozempic)      Hold below medications 72 hours prior to scheduled procedure/anesthesia. Restart medication on the following day after scheduled procedure/anesthesia  Canagliflozin  (Invokana )     For those patients who have a scheduled procedure/anesthesia on the same day of the week as their dose, hold the medication on the day of surgery.  They can take their scheduled dose the week before.  **Patients on the above medications scheduled for elective procedures that have not held the medication for the appropriate amount of time are at risk of cancellation or change in the anesthetic plan.

## 2024-01-03 ENCOUNTER — Ambulatory Visit (HOSPITAL_COMMUNITY): Payer: Self-pay | Admitting: Physician Assistant

## 2024-01-03 LAB — BASIC METABOLIC PANEL WITH GFR
BUN/Creatinine Ratio: 17 (ref 10–24)
BUN: 20 mg/dL (ref 8–27)
CO2: 24 mmol/L (ref 20–29)
Calcium: 9.4 mg/dL (ref 8.6–10.2)
Chloride: 103 mmol/L (ref 96–106)
Creatinine, Ser: 1.16 mg/dL (ref 0.76–1.27)
Glucose: 144 mg/dL — ABNORMAL HIGH (ref 70–99)
Potassium: 4.4 mmol/L (ref 3.5–5.2)
Sodium: 141 mmol/L (ref 134–144)
eGFR: 67 mL/min/1.73 (ref >59)

## 2024-01-03 LAB — CBC
Hematocrit: 40.8 % (ref 37.5–51.0)
Hemoglobin: 13.8 g/dL (ref 13.0–17.7)
MCH: 31.2 pg (ref 26.6–33.0)
MCHC: 33.8 g/dL (ref 31.5–35.7)
MCV: 92 fL (ref 79–97)
Platelets: 235 x10E3/uL (ref 150–450)
RBC: 4.43 x10E6/uL (ref 4.14–5.80)
RDW: 12.6 % (ref 11.6–15.4)
WBC: 7.3 x10E3/uL (ref 3.4–10.8)

## 2024-01-06 ENCOUNTER — Ambulatory Visit (HOSPITAL_COMMUNITY): Admitting: Anesthesiology

## 2024-01-06 ENCOUNTER — Encounter (HOSPITAL_COMMUNITY)
Admission: RE | Disposition: A | Discharge status: Home / Self Care | Payer: Self-pay | Source: Home / Self Care | Attending: Cardiology

## 2024-01-06 ENCOUNTER — Encounter (HOSPITAL_COMMUNITY): Payer: Self-pay | Admitting: Cardiology

## 2024-01-06 ENCOUNTER — Ambulatory Visit (HOSPITAL_COMMUNITY)
Admission: RE | Admit: 2024-01-06 | Discharge: 2024-01-06 | Disposition: A | Discharge status: Home / Self Care | Attending: Cardiology | Admitting: Cardiology

## 2024-01-06 ENCOUNTER — Other Ambulatory Visit: Payer: Self-pay

## 2024-01-06 DIAGNOSIS — Z794 Long term (current) use of insulin: Secondary | ICD-10-CM | POA: Diagnosis not present

## 2024-01-06 DIAGNOSIS — I4891 Unspecified atrial fibrillation: Secondary | ICD-10-CM | POA: Diagnosis not present

## 2024-01-06 DIAGNOSIS — I252 Old myocardial infarction: Secondary | ICD-10-CM | POA: Insufficient documentation

## 2024-01-06 DIAGNOSIS — I2510 Atherosclerotic heart disease of native coronary artery without angina pectoris: Secondary | ICD-10-CM | POA: Insufficient documentation

## 2024-01-06 DIAGNOSIS — G4730 Sleep apnea, unspecified: Secondary | ICD-10-CM

## 2024-01-06 DIAGNOSIS — Z7901 Long term (current) use of anticoagulants: Secondary | ICD-10-CM | POA: Insufficient documentation

## 2024-01-06 DIAGNOSIS — D6869 Other thrombophilia: Secondary | ICD-10-CM | POA: Insufficient documentation

## 2024-01-06 DIAGNOSIS — I10 Essential (primary) hypertension: Secondary | ICD-10-CM

## 2024-01-06 DIAGNOSIS — E119 Type 2 diabetes mellitus without complications: Secondary | ICD-10-CM | POA: Insufficient documentation

## 2024-01-06 DIAGNOSIS — I4819 Other persistent atrial fibrillation: Secondary | ICD-10-CM

## 2024-01-06 DIAGNOSIS — Z79899 Other long term (current) drug therapy: Secondary | ICD-10-CM | POA: Diagnosis not present

## 2024-01-06 DIAGNOSIS — Z7985 Long-term (current) use of injectable non-insulin antidiabetic drugs: Secondary | ICD-10-CM | POA: Insufficient documentation

## 2024-01-06 DIAGNOSIS — Z7984 Long term (current) use of oral hypoglycemic drugs: Secondary | ICD-10-CM | POA: Diagnosis not present

## 2024-01-06 HISTORY — PX: CARDIOVERSION: EP1203

## 2024-01-06 LAB — GLUCOSE, CAPILLARY: Glucose-Capillary: 155 mg/dL — ABNORMAL HIGH (ref 70–99)

## 2024-01-06 SURGERY — CARDIOVERSION (CATH LAB)
Anesthesia: General

## 2024-01-06 MED ORDER — SODIUM CHLORIDE 0.9 % IV SOLN: INTRAVENOUS | Status: DC

## 2024-01-06 MED ORDER — SODIUM CHLORIDE 0.9% FLUSH
INTRAVENOUS | Status: DC | PRN
Start: 2024-01-06 — End: 2024-01-06
  Administered 2024-01-06: 5 mL via INTRAVENOUS

## 2024-01-06 MED ORDER — PROPOFOL 10 MG/ML IV BOLUS
INTRAVENOUS | Status: DC | PRN
  Administered 2024-01-06: 60 mg via INTRAVENOUS

## 2024-01-06 MED ORDER — LIDOCAINE 2% (20 MG/ML) 5 ML SYRINGE
INTRAMUSCULAR | Status: DC | PRN
  Administered 2024-01-06: 60 mg via INTRAVENOUS

## 2024-01-06 SURGICAL SUPPLY — 1 items: PAD DEFIB RADIO PHYSIO CONN (PAD) ×1 IMPLANT

## 2024-01-06 NOTE — Anesthesia Postprocedure Evaluation (Signed)
 Anesthesia Post Note  Patient: Dennis King  Procedure(s) Performed: CARDIOVERSION     Patient location during evaluation: PACU Anesthesia Type: General Level of consciousness: awake and alert Pain management: pain level controlled Vital Signs Assessment: post-procedure vital signs reviewed and stable Respiratory status: spontaneous breathing, nonlabored ventilation, respiratory function stable and patient connected to nasal cannula oxygen Cardiovascular status: blood pressure returned to baseline and stable Postop Assessment: no apparent nausea or vomiting Anesthetic complications: no   No notable events documented.  Last Vitals:  Vitals:   01/06/24 0730 01/06/24 0808  BP: 116/83 128/82  Pulse: (!) 104 87  Resp: 18 12  Temp: 36.9 C (!) 36.1 C  SpO2: 95%     Last Pain:  Vitals:   01/06/24 0808  TempSrc:   PainSc: 0-No pain                 [name] Esker Dever

## 2024-01-06 NOTE — CV Procedure (Signed)
 Procedure:   DCCV  Indication:  Symptomatic atrial fibrillation  Procedure Note:  The patient signed informed consent.  They have had had therapeutic anticoagulation with Eliquis  greater than 3 weeks.  Anesthesia was administered by Dr. Dorethea and Olivia Blower, CRNA.  Adequate airway was maintained throughout and vital followed per protocol.  They were cardioverted x 1 with 200J of biphasic synchronized energy.  They converted to NSR with PACs, rate 80s.  There were no apparent complications.  The patient had normal neuro status and respiratory status post procedure with vitals stable as recorded elsewhere.    Follow up:  They will continue on current medical therapy and follow up with cardiology as scheduled.  Lonni Nanas, MD 01/06/2024 8:11 AM

## 2024-01-06 NOTE — Transfer of Care (Signed)
 Immediate Anesthesia Transfer of Care Note  Patient: Dennis King  Procedure(s) Performed: CARDIOVERSION  Patient Location: Cath Lab  Anesthesia Type:General  Level of Consciousness: awake, alert , oriented, and drowsy  Airway & Oxygen Therapy: Patient Spontanous Breathing  Post-op Assessment: Report given to RN and Post -op Vital signs reviewed and stable  Post vital signs: Reviewed and stable  Last Vitals:  Vitals Value Taken Time  BP    Temp    Pulse    Resp    SpO2      Last Pain:  Vitals:   01/06/24 0730  TempSrc: Temporal         Complications: No notable events documented.

## 2024-01-06 NOTE — Interval H&P Note (Signed)
 History and Physical Interval Note:  01/06/2024 7:56 AM  Dennis King  has presented today for surgery, with the diagnosis of afib.  The various methods of treatment have been discussed with the patient and family. After consideration of risks, benefits and other options for treatment, the patient has consented to  Procedure(s): CARDIOVERSION (N/A) as a surgical intervention.  The patient's history has been reviewed, patient examined, no change in status, stable for surgery.  I have reviewed the patient's chart and labs.  Questions were answered to the patient's satisfaction.     Dennis King

## 2024-01-06 NOTE — Anesthesia Preprocedure Evaluation (Addendum)
 Anesthesia Evaluation  Patient identified by MRN, date of birth, ID band Patient awake    Reviewed: Allergy & Precautions, NPO status , Patient's Chart, lab work & pertinent test results  Airway Mallampati: II  TM Distance: >3 FB Neck ROM: Full    Dental no notable dental hx.    Pulmonary sleep apnea    Pulmonary exam normal        Cardiovascular hypertension, + CAD and + Past MI  + dysrhythmias Atrial Fibrillation  Rhythm:Irregular Rate:Normal     Neuro/Psych negative neurological ROS  negative psych ROS   GI/Hepatic negative GI ROS, Neg liver ROS,,,  Endo/Other  diabetes    Renal/GU negative Renal ROS  negative genitourinary   Musculoskeletal  (+) Arthritis , Osteoarthritis,    Abdominal Normal abdominal exam  (+)   Peds  Hematology negative hematology ROS (+)   Anesthesia Other Findings   Reproductive/Obstetrics                              Anesthesia Physical Anesthesia Plan  ASA: 3  Anesthesia Plan: General   Post-op Pain Management:    Induction: Intravenous  PONV Risk Score and Plan: 2 and Treatment may vary due to age or medical condition  Airway Management Planned: Mask  Additional Equipment: None  Intra-op Plan:   Post-operative Plan:   Informed Consent: I have reviewed the patients History and Physical, chart, labs and discussed the procedure including the risks, benefits and alternatives for the proposed anesthesia with the patient or authorized representative who has indicated his/her understanding and acceptance.     Dental advisory given  Plan Discussed with: CRNA  Anesthesia Plan Comments:         Anesthesia Quick Evaluation

## 2024-02-21 DIAGNOSIS — I2510 Atherosclerotic heart disease of native coronary artery without angina pectoris: Secondary | ICD-10-CM | POA: Diagnosis not present

## 2024-02-21 DIAGNOSIS — Z6833 Body mass index (BMI) 33.0-33.9, adult: Secondary | ICD-10-CM | POA: Diagnosis not present

## 2024-02-21 DIAGNOSIS — E782 Mixed hyperlipidemia: Secondary | ICD-10-CM | POA: Diagnosis not present

## 2024-02-21 DIAGNOSIS — R42 Dizziness and giddiness: Secondary | ICD-10-CM | POA: Diagnosis not present

## 2024-02-21 DIAGNOSIS — I480 Paroxysmal atrial fibrillation: Secondary | ICD-10-CM | POA: Diagnosis not present

## 2024-02-21 DIAGNOSIS — I10 Essential (primary) hypertension: Secondary | ICD-10-CM | POA: Diagnosis not present

## 2024-02-21 DIAGNOSIS — Z23 Encounter for immunization: Secondary | ICD-10-CM | POA: Diagnosis not present

## 2024-02-21 DIAGNOSIS — E1142 Type 2 diabetes mellitus with diabetic polyneuropathy: Secondary | ICD-10-CM | POA: Diagnosis not present

## 2024-02-21 DIAGNOSIS — R918 Other nonspecific abnormal finding of lung field: Secondary | ICD-10-CM | POA: Diagnosis not present

## 2024-02-21 DIAGNOSIS — D6869 Other thrombophilia: Secondary | ICD-10-CM | POA: Diagnosis not present

## 2024-02-21 DIAGNOSIS — I700 Atherosclerosis of aorta: Secondary | ICD-10-CM | POA: Diagnosis not present

## 2024-02-21 DIAGNOSIS — L989 Disorder of the skin and subcutaneous tissue, unspecified: Secondary | ICD-10-CM | POA: Diagnosis not present

## 2024-02-22 ENCOUNTER — Ambulatory Visit (HOSPITAL_COMMUNITY): Attending: Physician Assistant | Admitting: Physician Assistant

## 2024-02-22 ENCOUNTER — Encounter (HOSPITAL_COMMUNITY): Payer: Self-pay

## 2024-02-22 NOTE — Progress Notes (Incomplete)
 Primary Care Physician: Frederik Charleston, MD Referring Physician: Dr. Kelsie  Primary EP: Dr Inocencio Charleston Dennis King is a 73 y.o. male with a h/o persistent afib, HTN, OSA on CPAP, T2DM,  s/p ablation cardiomyopathy, CAD, that is in the afib clinic for admission for Tikosyn  per Dr. Kelsie. He stopped Multaq  and HCTZ on 9/28. He called the office yesterday as he was having some shortness of breath and fluid retention. He was given lasix /K+ yesterday and again this am and this helped with his symptoms.  He has not missed any anticoagulation for at least 3 weeks  and no benadryl use. He has cut back but continues with daily alcohol and caffeine use.   F/u in afib clinic, 03/25/21. He is one week out from tikosyn  load. He had been feeling so much better and was surprised that he was back in afib this am. He was placed on spironolactone  in the hospital and has lost 10 lbs. Is feeling much improved from  abdominal swelling and exertional shortness of breath.   F/u in afib clinic, 02/09/22 for Tikosyn  surveillance. He reports having afib about every 2 weeks lasing around 12 hours. He has had 2 ablations in the past with Dr. Kelsie. He is complaint with tikosyn . We discussed today establishing with another EP in Dr. Merrick absence to see if a 3 rd ablation would be necessary and he is interested to proceed.   Follow up in the AF clinic 06/15/22. Patient reports that he has an episode of palpitations and fatigue once every 1-2 weeks. They typically last 2-3 hours but can be up to 12 hours. He was started on lisinopril  HCTZ 3 days ago by his PCP. He is scheduled for an afib ablation 07/14/22.  Follow up in the AF clinic 08/26/22. Patient is now s/p ablation by Dr. Inocencio on 07/14/22. He is doing great overall. He is in SR today and has not had any episodes of Afib since ablation. No chest pain, shortness of breath, or trouble swallowing. Leg sites healed without issue. He has been compliant with  anticoagulation and no bleeding concerns. He is interested in coming off Tikosyn  and will discuss with Dr. Inocencio at 3 month f/u.   Follow up 08/02/23. Patient returns for follow up for atrial fibrillation. He reports that he went back into afib on 07/27/23. He had symptoms of tachypalpitations, fatigue, and noted low BP. He has been using PRN diltiazem  to slow his heart rate which has improved his symptoms. He denies any missed doses of anticoagulation.   Follow up 08/23/23. Patient returns for follow up for atrial fibrillation. He is s/p DCCV 08/09/23. He remains in SR today. He did have some bradycardia for a couple days post procedure with heart rate in the low 50s but was asymptomatic. No bleeding issues on anticoagulation.   Follow up 01/02/24. Patient returns for follow up for atrial fibrillation. He states that he went back into afib this past Wednesday. He was out of town on vacation at the time. He has symptoms of fatigue. No bleeding issues on anticoagulation.   Follow up 02/22/24. Patient returns for follow up for atrial fibrillation. He is s/p DCCV on 01/06/24. ***  Today, he  denies symptoms of ***palpitations, chest pain, shortness of breath, orthopnea, PND, lower extremity edema, dizziness, presyncope, syncope, snoring, daytime somnolence, bleeding, or neurologic sequela. The patient is tolerating medications without difficulties and is otherwise without complaint today.    Past Medical History:  Diagnosis  Date   Arthritis    Coronary artery disease    past post circumflex obtuse marginal branch cutting balloon atherectomy by Dr. Dann   Family history of heart disease    Hyperlipidemia    Hypertension    Obesity    Obstructive sleep apnea    on C Pap   Paroxysmal A-fib (HCC)    Type 2 diabetes mellitus (HCC)     Current Outpatient Medications  Medication Sig Dispense Refill   apixaban  (ELIQUIS ) 5 MG TABS tablet TAKE ONE TABLET BY MOUTH TWICE DAILY 180 tablet 1   carvedilol   (COREG ) 25 MG tablet Take 25 mg by mouth 2 (two) times daily with a meal.      cholecalciferol (VITAMIN D) 25 MCG (1000 UNIT) tablet Take 1,000 Units by mouth every evening.     Continuous Blood Gluc Receiver (FREESTYLE LIBRE 2 READER) DEVI Use as directed to monitor blood glucose     Continuous Blood Gluc Sensor (FREESTYLE LIBRE 2 SENSOR) MISC Use as directed every 14 days     diltiazem  (CARDIZEM ) 60 MG tablet TAKE ONE TABLET EVERY FOUR HOURS AS NEEDED FOR HEART RATE >100 45 tablet 1   Evolocumab  (REPATHA  SURECLICK) 140 MG/ML SOAJ INJECT ONE PEN INTO THE SKIN EVERY 14 DAYS 6 mL 3   INVOKANA  300 MG TABS tablet Take 300 mg by mouth daily.  4   Krill Oil 1000 MG CAPS Take 3,000 capsules by mouth every morning.     lisinopril  (ZESTRIL ) 20 MG tablet TAKE 1 TABLET BY MOUTH EVERY DAY 90 tablet 3   metFORMIN  (GLUCOPHAGE ) 1000 MG tablet Take 1,000 mg by mouth 2 (two) times daily with a meal.      Multiple Vitamin (MULTIVITAMIN WITH MINERALS) TABS tablet Take 1 tablet by mouth daily.     nitroGLYCERIN  (NITROSTAT ) 0.4 MG SL tablet Place 1 tablet (0.4 mg total) under the tongue every 5 (five) minutes as needed for chest pain. 25 tablet 4   NON FORMULARY 1 Dose by Other route at bedtime. Cpap 8cm H2O     NOVOLOG  MIX 70/30 (70-30) 100 UNIT/ML injection Inject 10-20 Units into the skin 2 (two) times daily with a meal. If blood glucose is 100-130= 10 Take 20 units above 130 Sliding scale  4   OZEMPIC, 0.25 OR 0.5 MG/DOSE, 2 MG/3ML SOPN Inject 1 mg into the skin once a week.     sildenafil (REVATIO) 20 MG tablet Take 20 mg by mouth daily as needed (ED). Taking 2-5 tablets by mouth as needed     No current facility-administered medications for this visit.    ROS- All systems are reviewed and negative except as per the HPI above  Physical Exam: There were no vitals filed for this visit.   Wt Readings from Last 3 Encounters:  01/02/24 107.4 kg  08/23/23 108.3 kg  08/09/23 108 kg   GEN: Well nourished,  well developed in no acute distress NECK: No JVD; No carotid bruits CARDIAC: {EPRHYTHM:[id number]}, no murmurs, rubs, gallops RESPIRATORY:  Clear to auscultation without rales, wheezing or rhonchi  ABDOMEN: Soft, non-tender, non-distended EXTREMITIES:  No edema; No deformity    EKG today demonstrates ***   Echo 04/07/20 1. Left ventricular ejection fraction, by estimation, is 50 to 55%. The  left ventricle has low normal function. The left ventricle has no regional wall motion abnormalities. Left ventricular diastolic function could not be evaluated.   2. Right ventricular systolic function was not well visualized. The right ventricular size  is normal. Tricuspid regurgitation signal is inadequate for assessing PA pressure.   3. The mitral valve is normal in structure. Trivial mitral valve  regurgitation. No evidence of mitral stenosis.   4. The aortic valve is calcified. There is mild calcification of the  aortic valve. There is moderate thickening of the aortic valve. Aortic  valve regurgitation is not visualized. Mild to moderate aortic valve  sclerosis/calcification is present, without  any evidence of aortic stenosis.   5. The inferior vena cava is dilated in size with >50% respiratory  variability, suggesting right atrial pressure of 8 mmHg.    CHA2DS2-VASc Score = 4  The patient's score is based upon: CHF History: 0 HTN History: 1 Diabetes History: 1 Stroke History: 0 Vascular Disease History: 1 Age Score: 1 Gender Score: 0   {Confirm score is correct.  If not, click here to update score.  REFRESH note.  :1}    ASSESSMENT AND PLAN: Persistent Atrial Fibrillation (ICD10:  I48.19) The patient's CHA2DS2-VASc score is 4, indicating a 4.8% annual risk of stroke.   S/p afib ablation 2016, 2021, 07/14/22 Dofetilide  discontinued post ablation.  S/p DCCV 01/06/24 Patient appears to be maintaining SR. If he has significant recurrence of afib, he would like to be referred to consider  surgical ablation. His brother developed pulmonary fibrosis on amiodarone and he understandably wants to avoid this medication. *** Continue carvedilol  25 mg BID Continue Eliquis  5 mg BID  Continue diltiazem  60 mg q 4 hours PRN for heart racing.   Secondary Hypercoagulable State (ICD10:  D68.69){Click to add to Prob List or Visit Dx  :[id number]} The patient is at significant risk for stroke/thromboembolism based upon his CHA2DS2-VASc Score of 4.  Continue Apixaban  (Eliquis ). No bleeding issues.   HTN Stable on current regimen ***  CAD NSTEMI 2016 CAC score 1857 No anginal symptoms Patient referred back to Dr Court at previous visit ***   Follow up ***    Daril Kicks PA-C [facility] [address] [place], [place] [address] [phone]

## 2024-03-05 DIAGNOSIS — E1142 Type 2 diabetes mellitus with diabetic polyneuropathy: Secondary | ICD-10-CM | POA: Diagnosis not present

## 2024-03-07 ENCOUNTER — Ambulatory Visit (HOSPITAL_COMMUNITY)
Admission: RE | Admit: 2024-03-07 | Discharge: 2024-03-07 | Disposition: A | Discharge status: Home / Self Care | Source: Ambulatory Visit | Attending: Physician Assistant | Admitting: Physician Assistant

## 2024-03-07 VITALS — BP 150/82 | HR 72 | Ht 71.0 in | Wt 240.2 lb

## 2024-03-07 DIAGNOSIS — D6869 Other thrombophilia: Secondary | ICD-10-CM

## 2024-03-07 DIAGNOSIS — I4891 Unspecified atrial fibrillation: Secondary | ICD-10-CM

## 2024-03-07 DIAGNOSIS — I4819 Other persistent atrial fibrillation: Secondary | ICD-10-CM

## 2024-03-07 NOTE — Progress Notes (Signed)
 Primary Care Physician: Frederik Charleston, MD Referring Physician: Dr. Kelsie  Primary EP: Dr Inocencio Charleston Dennis King is a 73 y.o. male with a h/o persistent afib, HTN, OSA on CPAP, T2DM,  s/p ablation cardiomyopathy, CAD, that is in the afib clinic for admission for Tikosyn  per Dr. Kelsie. He stopped Multaq  and HCTZ on 9/28. He called the office yesterday as he was having some shortness of breath and fluid retention. He was given lasix /K+ yesterday and again this am and this helped with his symptoms.  He has not missed any anticoagulation for at least 3 weeks  and no benadryl use. He has cut back but continues with daily alcohol and caffeine use.   F/u in afib clinic, 03/25/21. He is one week out from tikosyn  load. He had been feeling so much better and was surprised that he was back in afib this am. He was placed on spironolactone  in the hospital and has lost 10 lbs. Is feeling much improved from  abdominal swelling and exertional shortness of breath.   F/u in afib clinic, 02/09/22 for Tikosyn  surveillance. He reports having afib about every 2 weeks lasing around 12 hours. He has had 2 ablations in the past with Dr. Kelsie. He is complaint with tikosyn . We discussed today establishing with another EP in Dr. Merrick absence to see if a 3 rd ablation would be necessary and he is interested to proceed.   Follow up in the AF clinic 06/15/22. Patient reports that he has an episode of palpitations and fatigue once every 1-2 weeks. They typically last 2-3 hours but can be up to 12 hours. He was started on lisinopril  HCTZ 3 days ago by his PCP. He is scheduled for an afib ablation 07/14/22.  Follow up in the AF clinic 08/26/22. Patient is now s/p ablation by Dr. Inocencio on 07/14/22. He is doing great overall. He is in SR today and has not had any episodes of Afib since ablation. No chest pain, shortness of breath, or trouble swallowing. Leg sites healed without issue. He has been compliant with  anticoagulation and no bleeding concerns. He is interested in coming off Tikosyn  and will discuss with Dr. Inocencio at 3 month f/u.   Follow up 08/02/23. Patient returns for follow up for atrial fibrillation. He reports that he went back into afib on 07/27/23. He had symptoms of tachypalpitations, fatigue, and noted low BP. He has been using PRN diltiazem  to slow his heart rate which has improved his symptoms. He denies any missed doses of anticoagulation.   Follow up 08/23/23. Patient returns for follow up for atrial fibrillation. He is s/p DCCV 08/09/23. He remains in SR today. He did have some bradycardia for a couple days post procedure with heart rate in the low 50s but was asymptomatic. No bleeding issues on anticoagulation.   Follow up 01/02/24. Patient returns for follow up for atrial fibrillation. He states that he went back into afib this past Wednesday. He was out of town on vacation at the time. He has symptoms of fatigue. No bleeding issues on anticoagulation.   Follow up 02/22/24. Patient returns for follow up for atrial fibrillation. He is s/p DCCV on 01/06/24. He remains in SR today and feels well, no interim symptoms of afib. No bleeding issues on anticoagulation.   Today, he  denies symptoms of palpitations, chest pain, shortness of breath, orthopnea, PND, lower extremity edema, dizziness, presyncope, syncope, snoring, daytime somnolence, bleeding, or neurologic sequela. The patient is  tolerating medications without difficulties and is otherwise without complaint today.    Past Medical History:  Diagnosis Date   Arthritis    Coronary artery disease    past post circumflex obtuse marginal branch cutting balloon atherectomy by Dr. Dann   Family history of heart disease    Hyperlipidemia    Hypertension    Obesity    Obstructive sleep apnea    on C Pap   Paroxysmal A-fib (HCC)    Type 2 diabetes mellitus (HCC)     Current Outpatient Medications  Medication Sig Dispense Refill    apixaban  (ELIQUIS ) 5 MG TABS tablet TAKE ONE TABLET BY MOUTH TWICE DAILY 180 tablet 1   carvedilol  (COREG ) 25 MG tablet Take 25 mg by mouth 2 (two) times daily with a meal.      cholecalciferol (VITAMIN D) 25 MCG (1000 UNIT) tablet Take 1,000 Units by mouth every evening.     Continuous Blood Gluc Receiver (FREESTYLE LIBRE 2 READER) DEVI Use as directed to monitor blood glucose     Continuous Blood Gluc Sensor (FREESTYLE LIBRE 2 SENSOR) MISC Use as directed every 14 days     Evolocumab  (REPATHA  SURECLICK) 140 MG/ML SOAJ INJECT ONE PEN INTO THE SKIN EVERY 14 DAYS 6 mL 3   INVOKANA  300 MG TABS tablet Take 300 mg by mouth daily.  4   lisinopril  (ZESTRIL ) 20 MG tablet TAKE 1 TABLET BY MOUTH EVERY DAY 90 tablet 3   metFORMIN  (GLUCOPHAGE ) 1000 MG tablet Take 1,000 mg by mouth 2 (two) times daily with a meal.      Multiple Vitamin (MULTIVITAMIN WITH MINERALS) TABS tablet Take 1 tablet by mouth daily.     nitroGLYCERIN  (NITROSTAT ) 0.4 MG SL tablet Place 1 tablet (0.4 mg total) under the tongue every 5 (five) minutes as needed for chest pain. 25 tablet 4   NON FORMULARY 1 Dose by Other route at bedtime. Cpap 8cm H2O     NOVOLOG  MIX 70/30 (70-30) 100 UNIT/ML injection Inject 10-20 Units into the skin 2 (two) times daily with a meal. If blood glucose is 100-130= 10 Take 20 units above 130 Sliding scale  4   Semaglutide (OZEMPIC, 2 MG/DOSE, [place]) Inject 2 mg into the skin once a week.     sildenafil (REVATIO) 20 MG tablet Take 20 mg by mouth daily as needed (ED). Taking 2-5 tablets by mouth as needed     diltiazem  (CARDIZEM ) 60 MG tablet TAKE ONE TABLET EVERY FOUR HOURS AS NEEDED FOR HEART RATE >100 45 tablet 1   Krill Oil 1000 MG CAPS Take 3,000 capsules by mouth every morning. (Patient not taking: Reported on 03/07/2024)     OZEMPIC, 0.25 OR 0.5 MG/DOSE, 2 MG/3ML SOPN Inject 1 mg into the skin once a week. (Patient not taking: Reported on 03/07/2024)     No current facility-administered medications for this  encounter.    ROS- All systems are reviewed and negative except as per the HPI above  Physical Exam: Vitals:   03/07/24 0942  BP: (!) 150/82  Pulse: 72  Weight: 109 kg  Height: 5' 11 (1.803 m)     Wt Readings from Last 3 Encounters:  03/07/24 109 kg  01/02/24 107.4 kg  08/23/23 108.3 kg   GEN: Well nourished, well developed in no acute distress CARDIAC: Regular rate and rhythm, no murmurs, rubs, gallops RESPIRATORY:  Clear to auscultation without rales, wheezing or rhonchi  ABDOMEN: Soft, non-tender, non-distended EXTREMITIES:  No edema; No deformity  EKG today demonstrates SR Vent. rate 72 BPM PR interval 168 ms QRS duration 92 ms QT/QTcB 376/411 ms   Echo 04/07/20 1. Left ventricular ejection fraction, by estimation, is 50 to 55%. The  left ventricle has low normal function. The left ventricle has no regional wall motion abnormalities. Left ventricular diastolic function could not be evaluated.   2. Right ventricular systolic function was not well visualized. The right ventricular size is normal. Tricuspid regurgitation signal is inadequate for assessing PA pressure.   3. The mitral valve is normal in structure. Trivial mitral valve  regurgitation. No evidence of mitral stenosis.   4. The aortic valve is calcified. There is mild calcification of the  aortic valve. There is moderate thickening of the aortic valve. Aortic  valve regurgitation is not visualized. Mild to moderate aortic valve  sclerosis/calcification is present, without  any evidence of aortic stenosis.   5. The inferior vena cava is dilated in size with >50% respiratory  variability, suggesting right atrial pressure of 8 mmHg.    CHA2DS2-VASc Score = 4  The patient's score is based upon: CHF History: 0 HTN History: 1 Diabetes History: 1 Stroke History: 0 Vascular Disease History: 1 Age Score: 1 Gender Score: 0       ASSESSMENT AND PLAN: Persistent Atrial Fibrillation (ICD10:   I48.19) The patient's CHA2DS2-VASc score is 4, indicating a 4.8% annual risk of stroke.   S/p afib ablation 2016, 2021, 07/14/22 Dofetilide  discontinued post ablation.  S/p DCCV 01/06/24 Patient appears to be maintaining SR. If he has significant recurrence of afib, he would like to be referred to consider surgical ablation. His brother developed pulmonary fibrosis on amiodarone and he understandably wants to avoid this medication.  Continue carvedilol  25 mg BID Continue Eliquis  5 mg BID  Continue diltiazem  60 mg q 4 hours PRN for heart racing.   Secondary Hypercoagulable State (ICD10:  D68.69) The patient is at significant risk for stroke/thromboembolism based upon his CHA2DS2-VASc Score of 4.  Continue Apixaban  (Eliquis ). No bleeding issues.   HTN Mildly elevated today, has been better controlled at previous visits.  Continue to monitor for now.   CAD NSTEMI 2016 CAC score 1857 No anginal symptoms Patient referred back to Dr Court at previous visit   Follow up with Dr Court as scheduled. AF clinic 6 months after that visit.     Daril Kicks PA-C [facility] [address] [place], [place] [address] [phone]

## 2024-03-08 DIAGNOSIS — L570 Actinic keratosis: Secondary | ICD-10-CM | POA: Diagnosis not present

## 2024-03-08 DIAGNOSIS — L821 Other seborrheic keratosis: Secondary | ICD-10-CM | POA: Diagnosis not present

## 2024-03-08 DIAGNOSIS — L578 Other skin changes due to chronic exposure to nonionizing radiation: Secondary | ICD-10-CM | POA: Diagnosis not present

## 2024-03-08 DIAGNOSIS — D485 Neoplasm of uncertain behavior of skin: Secondary | ICD-10-CM | POA: Diagnosis not present

## 2024-03-14 DIAGNOSIS — R2689 Other abnormalities of gait and mobility: Secondary | ICD-10-CM | POA: Diagnosis not present

## 2024-03-14 DIAGNOSIS — R42 Dizziness and giddiness: Secondary | ICD-10-CM | POA: Diagnosis not present

## 2024-03-20 DIAGNOSIS — R42 Dizziness and giddiness: Secondary | ICD-10-CM | POA: Diagnosis not present

## 2024-03-20 DIAGNOSIS — R2689 Other abnormalities of gait and mobility: Secondary | ICD-10-CM | POA: Diagnosis not present

## 2024-03-29 DIAGNOSIS — R42 Dizziness and giddiness: Secondary | ICD-10-CM | POA: Diagnosis not present

## 2024-03-29 DIAGNOSIS — R2689 Other abnormalities of gait and mobility: Secondary | ICD-10-CM | POA: Diagnosis not present

## 2024-04-03 DIAGNOSIS — R42 Dizziness and giddiness: Secondary | ICD-10-CM | POA: Diagnosis not present

## 2024-04-03 DIAGNOSIS — R2689 Other abnormalities of gait and mobility: Secondary | ICD-10-CM | POA: Diagnosis not present

## 2024-04-10 DIAGNOSIS — R42 Dizziness and giddiness: Secondary | ICD-10-CM | POA: Diagnosis not present

## 2024-04-10 DIAGNOSIS — R2689 Other abnormalities of gait and mobility: Secondary | ICD-10-CM | POA: Diagnosis not present

## 2024-04-23 ENCOUNTER — Ambulatory Visit: Attending: Cardiovascular Disease | Admitting: Cardiovascular Disease

## 2024-04-23 ENCOUNTER — Encounter: Payer: Self-pay | Admitting: Cardiovascular Disease

## 2024-04-23 VITALS — BP 150/84 | HR 73 | Ht 71.0 in | Wt 237.0 lb

## 2024-04-23 DIAGNOSIS — E782 Mixed hyperlipidemia: Secondary | ICD-10-CM | POA: Diagnosis not present

## 2024-04-23 DIAGNOSIS — G4733 Obstructive sleep apnea (adult) (pediatric): Secondary | ICD-10-CM

## 2024-04-23 DIAGNOSIS — Z9861 Coronary angioplasty status: Secondary | ICD-10-CM

## 2024-04-23 DIAGNOSIS — I2510 Atherosclerotic heart disease of native coronary artery without angina pectoris: Secondary | ICD-10-CM | POA: Diagnosis not present

## 2024-04-23 DIAGNOSIS — I10 Essential (primary) hypertension: Secondary | ICD-10-CM | POA: Diagnosis not present

## 2024-04-23 DIAGNOSIS — I480 Paroxysmal atrial fibrillation: Secondary | ICD-10-CM | POA: Diagnosis not present

## 2024-04-23 MED ORDER — NITROGLYCERIN 0.4 MG SL SUBL: 0.4000 mg | SUBLINGUAL_TABLET | SUBLINGUAL | 4 refills | Status: AC | PRN

## 2024-04-23 NOTE — Progress Notes (Signed)
 04/23/2024 Dennis King   1951/02/11  [id number]  Primary Physician Frederik Lamar, MD Primary Cardiologist: Dorn JINNY Lesches MD [facility] [place], [place]  HPI:  Dennis King is a 73 y.o.  married Caucasian male father of 2 children who is a retired international aid/development worker.  He was referred for evaluation of palpitations and chest pain. I last saw him in the office 09/12/2015. SABRAHis cardiac risk factor profile is notable for treated hypertension, diabetes and hyperlipidemia. He does have a family history of heart disease with a father who had his first MI at age 47 and died 10 years later from this. His mother had atrial fibrillation. He does have obstructive sleep apnea C Pap. He noted palpitations last summer which has been occurring on a monthly basis is associated with chest pressure and relative hypotension. He does drink 4 glasses of wine at night and several glasses of whiskey a week. He had a Myoview stress test performed after that that showed normal perfusion with decreased LV function and a 2-D echo that showed EF in the 45% range. He ultimately was admitted with unstable angina by Dr. Lavona  and had positive enzymes. Cardiac catheterization performed by Dr. Dann. revealed a high-grade ostial second obtuse marginal branch stenosis which underwent cutting balloon atherectomy with an excellent angiographic result. An event monitor confirmed A. Fib ablation which he is symptomatic from. Since discharge he has decreased his alcohol intake. We are stopping the Plavix  today and initiating oral and a coagulation because his CHA2DSVASC2 score is  3 . I referred him to Dr. Lynwood Rakers who ultimately performed A. Fib ablation in September 2016. He's had some minor breakthrough episodes but for most part has maintained sinus rhythm on Eliquis  oral anticoagulation.  Since I saw him 8-1/2 years ago he has primarily been followed by EP.  He had a second ablation by Dr. Inocencio 07/14/2022 and 2  subsequent cardioversions, 08/09/2023 and again 01/06/2024 which she attributes to alcohol which he has since abstained from.  He was on Tikosyn  in the past which was discontinued.  He still very active, works on his farm, horseback rides and does trout fishing.  He denies chest pain or shortness of breath.   Current Meds  Medication Sig   apixaban  (ELIQUIS ) 5 MG TABS tablet TAKE ONE TABLET BY MOUTH TWICE DAILY   carvedilol  (COREG ) 25 MG tablet Take 25 mg by mouth 2 (two) times daily with a meal.    cholecalciferol (VITAMIN D) 25 MCG (1000 UNIT) tablet Take 1,000 Units by mouth every evening.   Continuous Blood Gluc Receiver (FREESTYLE LIBRE 2 READER) DEVI Use as directed to monitor blood glucose   Continuous Blood Gluc Sensor (FREESTYLE LIBRE 2 SENSOR) MISC Use as directed every 14 days   diltiazem  (CARDIZEM ) 60 MG tablet TAKE ONE TABLET EVERY FOUR HOURS AS NEEDED FOR HEART RATE >100   Evolocumab  (REPATHA  SURECLICK) 140 MG/ML SOAJ INJECT ONE PEN INTO THE SKIN EVERY 14 DAYS   INVOKANA  300 MG TABS tablet Take 300 mg by mouth daily.   lisinopril  (ZESTRIL ) 20 MG tablet TAKE 1 TABLET BY MOUTH EVERY DAY   metFORMIN  (GLUCOPHAGE ) 1000 MG tablet Take 1,000 mg by mouth 2 (two) times daily with a meal.    Multiple Vitamin (MULTIVITAMIN WITH MINERALS) TABS tablet Take 1 tablet by mouth daily.   NON FORMULARY 1 Dose by Other route at bedtime. Cpap 8cm H2O   NOVOLOG  MIX 70/30 (70-30) 100 UNIT/ML injection Inject 10-20 Units  into the skin 2 (two) times daily with a meal. If blood glucose is 100-130= 10 Take 20 units above 130 Sliding scale   Semaglutide (OZEMPIC, 2 MG/DOSE, [place]) Inject 2 mg into the skin once a week.   sildenafil (REVATIO) 20 MG tablet Take 20 mg by mouth daily as needed (ED). Taking 2-5 tablets by mouth as needed   [DISCONTINUED] nitroGLYCERIN  (NITROSTAT ) 0.4 MG SL tablet Place 1 tablet (0.4 mg total) under the tongue every 5 (five) minutes as needed for chest pain.     Allergies  Allergen  Reactions   Statins Other (See Comments)    All Statins Myalgia    Social History   Socioeconomic History   Marital status: Married    Spouse name: Not on file   Number of children: 2   Years of education: Not on file   Highest education level: Not on file  Occupational History   Not on file  Tobacco Use   Smoking status: Never   Smokeless tobacco: Never   Tobacco comments:    Never smoke 06/15/22  Vaping Use   Vaping status: Never Used  Substance and Sexual Activity   Alcohol use: Yes    Alcohol/week: 7.0 - 14.0 standard drinks of alcohol    Types: 7 - 14 Glasses of wine per week    Comment: 1-2 glass of wine weekly 06/15/22   Drug use: No   Sexual activity: Not on file  Other Topics Concern   Not on file  Social History Narrative   Retired International Aid/development Worker.  Drinks 12 cups of coffee daily.  Lives in [place].   Social Drivers of Corporate Investment Banker Strain: Not on file  Food Insecurity: Not on file  Transportation Needs: Not on file  Physical Activity: Not on file  Stress: Not on file  Social Connections: Not on file  Intimate Partner Violence: Not on file     Review of Systems: General: negative for chills, fever, night sweats or weight changes.  Cardiovascular: negative for chest pain, dyspnea on exertion, edema, orthopnea, palpitations, paroxysmal nocturnal dyspnea or shortness of breath Dermatological: negative for rash Respiratory: negative for cough or wheezing Urologic: negative for hematuria Abdominal: negative for nausea, vomiting, diarrhea, bright red blood per rectum, melena, or hematemesis Neurologic: negative for visual changes, syncope, or dizziness All other systems reviewed and are otherwise negative except as noted above.    Blood pressure (!) 150/84, pulse 73, height 5' 11 (1.803 m), weight 237 lb (107.5 kg), SpO2 96%.  General appearance: alert and no distress Neck: no adenopathy, no carotid bruit, no JVD, supple, symmetrical,  trachea midline, and thyroid not enlarged, symmetric, no tenderness/mass/nodules Lungs: clear to auscultation bilaterally Heart: regular rate and rhythm, S1, S2 normal, no murmur, click, rub or gallop Extremities: extremities normal, atraumatic, no cyanosis or edema Pulses: 2+ and symmetric Skin: Skin color, texture, turgor normal. No rashes or lesions Neurologic: Grossly normal  EKG not performed today      ASSESSMENT AND PLAN:   Essential hypertension History of essential hypertension blood pressure measured today 150/84.  He is on carvedilol  and lisinopril .  Hyperlipidemia History of hyperlipidemia on Repatha  with lipid profile performed 05/24/2023 revealing total cholesterol 143, LDL 54 and HDL 72, at goal for secondary prevention.  Obstructive sleep apnea-on C-pap History of obstructive sleep apnea on CPAP.  PAF- CHADS VASc= 3 (HTN, DM, CAD).  History of PAF status post ablation by Dr. Kelsie September 2016 and again most recently by Dr.  Camnitz 07/14/2022.  He did have DC cardioversion performed 08/09/2023 and again most recently 01/06/2024.  He has recurrent A-fib and is sensitive to alcohol intake which he has abstained from since that time.  He is on Eliquis  oral anticoagulation.  He was on Tikosyn  in the past which was discontinued.  CAD S/P ostial OM2 POBA History of CAD status post non-STEMI 10/22/2014 with Cutting Balloon angioplasty of the second obtuse marginal branch ostium.  Otherwise he had no significant obstructive disease.  He denies chest pain or shortness of breath.     Dorn DOROTHA Lesches MD FACP,FACC,FAHA, [facility] 04/23/2024 10:03 AM

## 2024-04-23 NOTE — Assessment & Plan Note (Signed)
 History of essential hypertension blood pressure measured today 150/84.  He is on carvedilol  and lisinopril .

## 2024-04-23 NOTE — Assessment & Plan Note (Signed)
 History of obstructive sleep apnea on CPAP.

## 2024-04-23 NOTE — Assessment & Plan Note (Signed)
 History of CAD status post non-STEMI 10/22/2014 with Cutting Balloon angioplasty of the second obtuse marginal branch ostium.  Otherwise he had no significant obstructive disease.  He denies chest pain or shortness of breath.

## 2024-04-23 NOTE — Assessment & Plan Note (Signed)
 History of PAF status post ablation by Dr. Kelsie September 2016 and again most recently by Dr. Inocencio 07/14/2022.  He did have DC cardioversion performed 08/09/2023 and again most recently 01/06/2024.  He has recurrent A-fib and is sensitive to alcohol intake which he has abstained from since that time.  He is on Eliquis  oral anticoagulation.  He was on Tikosyn  in the past which was discontinued.

## 2024-04-23 NOTE — Patient Instructions (Signed)

## 2024-04-23 NOTE — Assessment & Plan Note (Signed)
 History of hyperlipidemia on Repatha  with lipid profile performed 05/24/2023 revealing total cholesterol 143, LDL 54 and HDL 72, at goal for secondary prevention.

## 2024-04-24 DIAGNOSIS — R42 Dizziness and giddiness: Secondary | ICD-10-CM | POA: Diagnosis not present

## 2024-04-24 DIAGNOSIS — R2689 Other abnormalities of gait and mobility: Secondary | ICD-10-CM | POA: Diagnosis not present

## 2024-05-01 ENCOUNTER — Encounter: Payer: Self-pay | Admitting: Cardiology

## 2024-05-01 ENCOUNTER — Ambulatory Visit (HOSPITAL_COMMUNITY): Admission: RE | Admit: 2024-05-01 | Discharge: 2024-05-01 | Attending: Internal Medicine | Admitting: Internal Medicine

## 2024-05-01 ENCOUNTER — Encounter (HOSPITAL_COMMUNITY): Payer: Self-pay | Admitting: Internal Medicine

## 2024-05-01 VITALS — BP 116/70 | HR 130 | Ht 71.0 in | Wt 243.6 lb

## 2024-05-01 DIAGNOSIS — D6869 Other thrombophilia: Secondary | ICD-10-CM | POA: Diagnosis not present

## 2024-05-01 DIAGNOSIS — I4819 Other persistent atrial fibrillation: Secondary | ICD-10-CM

## 2024-05-01 MED ORDER — DRONEDARONE HCL 400 MG PO TABS: 400.0000 mg | ORAL_TABLET | Freq: Two times a day (BID) | ORAL | 6 refills | Status: AC

## 2024-05-01 NOTE — Patient Instructions (Addendum)
 Continue to Hold INVOKANA  and OZEMPIC until cardioversion   Decrease lisinopril  to 10 mg until cardioversion   Start Multaq  400 mg twice a day with food   Hold metformin  the morning of the procedure  Cardioversion scheduled for: 05/07/24 1:00 pm    - Arrive at the [facility] ([address]  and check in with ADMITTING at 1:00pm    - Do not eat or drink anything after midnight the night prior to your procedure.   - Take all your morning medication (except diabetic medications) with a sip of water prior to arrival.  - Do NOT miss any doses of your blood thinner - if you should miss a dose or take a dose more than 4 hours late -- please notify our office immediately.  - You will not be able to drive home after your procedure. Please ensure you have a responsible adult to drive you home. You will need someone with you for 24 hours post procedure.     - Expect to be in the procedural area approximately 2 hours.   - If you feel as if you go back into normal rhythm prior to scheduled cardioversion, please notify our office immediately.   If your procedure is canceled in the cardioversion suite you will be charged a cancellation fee.    Hold below medications 7 days prior to scheduled procedure/anesthesia.  Restart medication on the normal dosing day after scheduled procedure/anesthesia  Semaglutide (Ozempic) [facility])     Hold below medications 72 hours prior to scheduled procedure/anesthesia. Restart medication on the following day after scheduled procedure/anesthesia Canagliflozin  (Invokana )    For those patients who have a scheduled procedure/anesthesia on the same day of the week as their dose, hold the medication on the day of surgery.  They can take their scheduled dose the week before.  **Patients on the above medications scheduled for elective procedures that have not held the medication for the appropriate amount of time are at  risk of cancellation or change in the anesthetic plan.

## 2024-05-01 NOTE — Progress Notes (Signed)
 Primary Care Physician: Frederik Charleston, MD Referring Physician: Dr. Kelsie  Primary EP: Dr Inocencio Charleston Dennis King is a 73 y.o. male with a h/o persistent afib, HTN, OSA on CPAP, T2DM,  s/p ablation cardiomyopathy, CAD, that is in the afib clinic for admission for Tikosyn  per Dr. Kelsie. He stopped Multaq  and HCTZ on 9/28. He called the office yesterday as he was having some shortness of breath and fluid retention. He was given lasix /K+ yesterday and again this am and this helped with his symptoms.  He has not missed any anticoagulation for at least 3 weeks  and no benadryl use. He has cut back but continues with daily alcohol and caffeine use.   F/u in afib clinic, 03/25/21. He is one week out from tikosyn  load. He had been feeling so much better and was surprised that he was back in afib this am. He was placed on spironolactone  in the hospital and has lost 10 lbs. Is feeling much improved from  abdominal swelling and exertional shortness of breath.   F/u in afib clinic, 02/09/22 for Tikosyn  surveillance. He reports having afib about every 2 weeks lasing around 12 hours. He has had 2 ablations in the past with Dr. Kelsie. He is complaint with tikosyn . We discussed today establishing with another EP in Dr. Merrick absence to see if a 3 rd ablation would be necessary and he is interested to proceed.   Follow up in the AF clinic 06/15/22. Patient reports that he has an episode of palpitations and fatigue once every 1-2 weeks. They typically last 2-3 hours but can be up to 12 hours. He was started on lisinopril  HCTZ 3 days ago by his PCP. He is scheduled for an afib ablation 07/14/22.  Follow up in the AF clinic 08/26/22. Patient is now s/p ablation by Dr. Inocencio on 07/14/22. He is doing great overall. He is in SR today and has not had any episodes of Afib since ablation. No chest pain, shortness of breath, or trouble swallowing. Leg sites healed without issue. He has been compliant with  anticoagulation and no bleeding concerns. He is interested in coming off Tikosyn  and will discuss with Dr. Inocencio at 3 month f/u.   Follow up 08/02/23. Patient returns for follow up for atrial fibrillation. He reports that he went back into afib on 07/27/23. He had symptoms of tachypalpitations, fatigue, and noted low BP. He has been using PRN diltiazem  to slow his heart rate which has improved his symptoms. He denies any missed doses of anticoagulation.   Follow up 08/23/23. Patient returns for follow up for atrial fibrillation. He is s/p DCCV 08/09/23. He remains in SR today. He did have some bradycardia for a couple days post procedure with heart rate in the low 50s but was asymptomatic. No bleeding issues on anticoagulation.   Follow up 01/02/24. Patient returns for follow up for atrial fibrillation. He states that he went back into afib this past Wednesday. He was out of town on vacation at the time. He has symptoms of fatigue. No bleeding issues on anticoagulation.   Follow up 02/22/24. Patient returns for follow up for atrial fibrillation. He is s/p DCCV on 01/06/24. He remains in SR today and feels well, no interim symptoms of afib. No bleeding issues on anticoagulation.   Follow-up 05/01/2024.  Patient is currently in A-fib with RVR.  He notes that he has gone out of rhythm 3 times in the past couple of years all coinciding  with watching the [place]  bulldogs play football.  He has held his Invokana  since Saturday and held his Ozempic today.  He has been using diltiazem  60 mg as needed for heart rate greater than 100.  No missed doses of Eliquis .  Today, he  denies symptoms of palpitations, chest pain, shortness of breath, orthopnea, PND, lower extremity edema, dizziness, presyncope, syncope, snoring, daytime somnolence, bleeding, or neurologic sequela. The patient is tolerating medications without difficulties and is otherwise without complaint today.    Past Medical History:  Diagnosis Date    Arthritis    Coronary artery disease    past post circumflex obtuse marginal branch cutting balloon atherectomy by Dr. Dann   Family history of heart disease    Hyperlipidemia    Hypertension    Obesity    Obstructive sleep apnea    on C Pap   Paroxysmal A-fib (HCC)    Type 2 diabetes mellitus (HCC)     Current Outpatient Medications  Medication Sig Dispense Refill   apixaban  (ELIQUIS ) 5 MG TABS tablet TAKE ONE TABLET BY MOUTH TWICE DAILY 180 tablet 1   carvedilol  (COREG ) 25 MG tablet Take 25 mg by mouth 2 (two) times daily with a meal.      cholecalciferol (VITAMIN D) 25 MCG (1000 UNIT) tablet Take 1,000 Units by mouth every evening.     Continuous Blood Gluc Receiver (FREESTYLE LIBRE 2 READER) DEVI Use as directed to monitor blood glucose     Continuous Blood Gluc Sensor (FREESTYLE LIBRE 2 SENSOR) MISC Use as directed every 14 days     diltiazem  (CARDIZEM ) 60 MG tablet TAKE ONE TABLET EVERY FOUR HOURS AS NEEDED FOR HEART RATE >100 45 tablet 1   Evolocumab  (REPATHA  SURECLICK) 140 MG/ML SOAJ INJECT ONE PEN INTO THE SKIN EVERY 14 DAYS 6 mL 3   INVOKANA  300 MG TABS tablet Take 300 mg by mouth daily.  4   lisinopril  (ZESTRIL ) 20 MG tablet TAKE 1 TABLET BY MOUTH EVERY DAY 90 tablet 3   metFORMIN  (GLUCOPHAGE ) 1000 MG tablet Take 1,000 mg by mouth 2 (two) times daily with a meal.      Multiple Vitamin (MULTIVITAMIN WITH MINERALS) TABS tablet Take 1 tablet by mouth daily.     nitroGLYCERIN  (NITROSTAT ) 0.4 MG SL tablet Place 1 tablet (0.4 mg total) under the tongue every 5 (five) minutes as needed for chest pain. 25 tablet 4   NON FORMULARY 1 Dose by Other route at bedtime. Cpap 8cm H2O     NOVOLOG  MIX 70/30 (70-30) 100 UNIT/ML injection Inject 10-20 Units into the skin 2 (two) times daily with a meal. If blood glucose is 100-130= 10 Take 20 units above 130 Sliding scale  4   Semaglutide (OZEMPIC, 2 MG/DOSE, [place]) Inject 2 mg into the skin once a week.     sildenafil (REVATIO) 20 MG tablet  Take 20 mg by mouth daily as needed (ED). Taking 2-5 tablets by mouth as needed     No current facility-administered medications for this encounter.    ROS- All systems are reviewed and negative except as per the HPI above  Physical Exam: Vitals:   05/01/24 0902  BP: 116/70  Pulse: (!) 130  Weight: 110.5 kg  Height: 5' 11 (1.803 m)    Wt Readings from Last 3 Encounters:  05/01/24 110.5 kg  04/23/24 107.5 kg  03/07/24 109 kg   GEN- The patient is well appearing, alert and oriented x 3 today.   Neck - no  JVD or carotid bruit noted Lungs- Clear to ausculation bilaterally, normal work of breathing Heart- Irregular tachycardic rate and rhythm, no murmurs, rubs or gallops, PMI not laterally displaced Extremities- no clubbing, cyanosis, or edema Skin - no rash or ecchymosis noted   EKG today demonstrates EKG Interpretation Date/Time:  Tuesday May 01 2024 09:04:26 EST Ventricular Rate:  130 PR Interval:    QRS Duration:  88 QT Interval:  258 QTC Calculation: 379 R Axis:   47  Text Interpretation: Atrial fibrillation with rapid ventricular response Abnormal ECG When compared with ECG of 07-Mar-2024 09:49, Atrial fibrillation has replaced Sinus rhythm Confirmed by Terra Pac ([id number]) on 05/01/2024 9:28:47 AM     Echo 04/07/20 1. Left ventricular ejection fraction, by estimation, is 50 to 55%. The  left ventricle has low normal function. The left ventricle has no regional wall motion abnormalities. Left ventricular diastolic function could not be evaluated.   2. Right ventricular systolic function was not well visualized. The right ventricular size is normal. Tricuspid regurgitation signal is inadequate for assessing PA pressure.   3. The mitral valve is normal in structure. Trivial mitral valve  regurgitation. No evidence of mitral stenosis.   4. The aortic valve is calcified. There is mild calcification of the  aortic valve. There is moderate thickening of the aortic  valve. Aortic  valve regurgitation is not visualized. Mild to moderate aortic valve  sclerosis/calcification is present, without  any evidence of aortic stenosis.   5. The inferior vena cava is dilated in size with >50% respiratory  variability, suggesting right atrial pressure of 8 mmHg.    CHA2DS2-VASc Score = 4  The patient's score is based upon: CHF History: 0 HTN History: 1 Diabetes History: 1 Stroke History: 0 Vascular Disease History: 1 Age Score: 1 Gender Score: 0       ASSESSMENT AND PLAN: Persistent Atrial Fibrillation (ICD10:  I48.19) The patient's CHA2DS2-VASc score is 4, indicating a 4.8% annual risk of stroke.   S/p afib ablation 2016, 2021, 07/14/22 Dofetilide  discontinued post ablation.  S/p DCCV 01/06/24  Patient is currently in A-fib.  We discussed rhythm control options given this will be his third cardioversion this year.  After discussion, patient is very interested in speaking with [facility] regarding convergent procedure.  He declines a follow-up with Dr. Inocencio at this time.  In the interim, he agrees to begin temporary antiarrhythmic medication therapy to try to maintain sinus rhythm.  After discussion, we will start Multaq  400 mg twice daily 5 days prior to cardioversion.  He has held his own Invokana  for several days and he has held his Ozempic today.  Continue Coreg  25 mg twice daily.  Decrease lisinopril  to 10 mg daily.  Continue diltiazem  60 mg as needed. We discussed the procedure cardioversion to try to convert to NSR. We discussed the risks vs benefits of this procedure and how ultimately we cannot predict whether a patient will have early return of arrhythmia post procedure. After discussion, the patient wishes to proceed with cardioversion. Labs drawn today.   Informed Consent   Shared Decision Making/Informed Consent The risks (stroke, cardiac arrhythmias rarely resulting in the need for a temporary or permanent pacemaker, skin irritation or burns and  complications associated with conscious sedation including aspiration, arrhythmia, respiratory failure and death), benefits (restoration of normal sinus rhythm) and alternatives of a direct current cardioversion were explained in detail to Dr. Harles and he agrees to proceed.       Secondary Hypercoagulable State (  ICD10:  D68.69) The patient is at significant risk for stroke/thromboembolism based upon his CHA2DS2-VASc Score of 4.  Continue Apixaban  (Eliquis ).  No missed doses.  HTN Stable today.  CAD NSTEMI 2016 CAC score 1857 No anginal symptoms.    Follow up 2 weeks after DCCV.    Dorn Heinrich, PA-C Afib Clinic [facility] [address] [place], [place] [address] [phone]

## 2024-05-01 NOTE — H&P (View-Only) (Signed)
 Primary Care Physician: Frederik Charleston, MD Referring Physician: Dr. Kelsie  Primary EP: Dr Inocencio Charleston Dennis King is a 73 y.o. male with a h/o persistent afib, HTN, OSA on CPAP, T2DM,  s/p ablation cardiomyopathy, CAD, that is in the afib clinic for admission for Tikosyn  per Dr. Kelsie. He stopped Multaq  and HCTZ on 9/28. He called the office yesterday as he was having some shortness of breath and fluid retention. He was given lasix /K+ yesterday and again this am and this helped with his symptoms.  He has not missed any anticoagulation for at least 3 weeks  and no benadryl use. He has cut back but continues with daily alcohol and caffeine use.   F/u in afib clinic, 03/25/21. He is one week out from tikosyn  load. He had been feeling so much better and was surprised that he was back in afib this am. He was placed on spironolactone  in the hospital and has lost 10 lbs. Is feeling much improved from  abdominal swelling and exertional shortness of breath.   F/u in afib clinic, 02/09/22 for Tikosyn  surveillance. He reports having afib about every 2 weeks lasing around 12 hours. He has had 2 ablations in the past with Dr. Kelsie. He is complaint with tikosyn . We discussed today establishing with another EP in Dr. Merrick absence to see if a 3 rd ablation would be necessary and he is interested to proceed.   Follow up in the AF clinic 06/15/22. Patient reports that he has an episode of palpitations and fatigue once every 1-2 weeks. They typically last 2-3 hours but can be up to 12 hours. He was started on lisinopril  HCTZ 3 days ago by his PCP. He is scheduled for an afib ablation 07/14/22.  Follow up in the AF clinic 08/26/22. Patient is now s/p ablation by Dr. Inocencio on 07/14/22. He is doing great overall. He is in SR today and has not had any episodes of Afib since ablation. No chest pain, shortness of breath, or trouble swallowing. Leg sites healed without issue. He has been compliant with  anticoagulation and no bleeding concerns. He is interested in coming off Tikosyn  and will discuss with Dr. Inocencio at 3 month f/u.   Follow up 08/02/23. Patient returns for follow up for atrial fibrillation. He reports that he went back into afib on 07/27/23. He had symptoms of tachypalpitations, fatigue, and noted low BP. He has been using PRN diltiazem  to slow his heart rate which has improved his symptoms. He denies any missed doses of anticoagulation.   Follow up 08/23/23. Patient returns for follow up for atrial fibrillation. He is s/p DCCV 08/09/23. He remains in SR today. He did have some bradycardia for a couple days post procedure with heart rate in the low 50s but was asymptomatic. No bleeding issues on anticoagulation.   Follow up 01/02/24. Patient returns for follow up for atrial fibrillation. He states that he went back into afib this past Wednesday. He was out of town on vacation at the time. He has symptoms of fatigue. No bleeding issues on anticoagulation.   Follow up 02/22/24. Patient returns for follow up for atrial fibrillation. He is s/p DCCV on 01/06/24. He remains in SR today and feels well, no interim symptoms of afib. No bleeding issues on anticoagulation.   Follow-up 05/01/2024.  Patient is currently in A-fib with RVR.  He notes that he has gone out of rhythm 3 times in the past couple of years all coinciding  with watching the [place]  bulldogs play football.  He has held his Invokana  since Saturday and held his Ozempic today.  He has been using diltiazem  60 mg as needed for heart rate greater than 100.  No missed doses of Eliquis .  Today, he  denies symptoms of palpitations, chest pain, shortness of breath, orthopnea, PND, lower extremity edema, dizziness, presyncope, syncope, snoring, daytime somnolence, bleeding, or neurologic sequela. The patient is tolerating medications without difficulties and is otherwise without complaint today.    Past Medical History:  Diagnosis Date    Arthritis    Coronary artery disease    past post circumflex obtuse marginal branch cutting balloon atherectomy by Dr. Dann   Family history of heart disease    Hyperlipidemia    Hypertension    Obesity    Obstructive sleep apnea    on C Pap   Paroxysmal A-fib (HCC)    Type 2 diabetes mellitus (HCC)     Current Outpatient Medications  Medication Sig Dispense Refill   apixaban  (ELIQUIS ) 5 MG TABS tablet TAKE ONE TABLET BY MOUTH TWICE DAILY 180 tablet 1   carvedilol  (COREG ) 25 MG tablet Take 25 mg by mouth 2 (two) times daily with a meal.      cholecalciferol (VITAMIN D) 25 MCG (1000 UNIT) tablet Take 1,000 Units by mouth every evening.     Continuous Blood Gluc Receiver (FREESTYLE LIBRE 2 READER) DEVI Use as directed to monitor blood glucose     Continuous Blood Gluc Sensor (FREESTYLE LIBRE 2 SENSOR) MISC Use as directed every 14 days     diltiazem  (CARDIZEM ) 60 MG tablet TAKE ONE TABLET EVERY FOUR HOURS AS NEEDED FOR HEART RATE >100 45 tablet 1   Evolocumab  (REPATHA  SURECLICK) 140 MG/ML SOAJ INJECT ONE PEN INTO THE SKIN EVERY 14 DAYS 6 mL 3   INVOKANA  300 MG TABS tablet Take 300 mg by mouth daily.  4   lisinopril  (ZESTRIL ) 20 MG tablet TAKE 1 TABLET BY MOUTH EVERY DAY 90 tablet 3   metFORMIN  (GLUCOPHAGE ) 1000 MG tablet Take 1,000 mg by mouth 2 (two) times daily with a meal.      Multiple Vitamin (MULTIVITAMIN WITH MINERALS) TABS tablet Take 1 tablet by mouth daily.     nitroGLYCERIN  (NITROSTAT ) 0.4 MG SL tablet Place 1 tablet (0.4 mg total) under the tongue every 5 (five) minutes as needed for chest pain. 25 tablet 4   NON FORMULARY 1 Dose by Other route at bedtime. Cpap 8cm H2O     NOVOLOG  MIX 70/30 (70-30) 100 UNIT/ML injection Inject 10-20 Units into the skin 2 (two) times daily with a meal. If blood glucose is 100-130= 10 Take 20 units above 130 Sliding scale  4   Semaglutide (OZEMPIC, 2 MG/DOSE, ) Inject 2 mg into the skin once a week.     sildenafil (REVATIO) 20 MG tablet  Take 20 mg by mouth daily as needed (ED). Taking 2-5 tablets by mouth as needed     No current facility-administered medications for this encounter.    ROS- All systems are reviewed and negative except as per the HPI above  Physical Exam: Vitals:   05/01/24 0902  BP: 116/70  Pulse: (!) 130  Weight: 110.5 kg  Height: 5' 11 (1.803 m)    Wt Readings from Last 3 Encounters:  05/01/24 110.5 kg  04/23/24 107.5 kg  03/07/24 109 kg   GEN- The patient is well appearing, alert and oriented x 3 today.   Neck - no  JVD or carotid bruit noted Lungs- Clear to ausculation bilaterally, normal work of breathing Heart- Irregular tachycardic rate and rhythm, no murmurs, rubs or gallops, PMI not laterally displaced Extremities- no clubbing, cyanosis, or edema Skin - no rash or ecchymosis noted   EKG today demonstrates EKG Interpretation Date/Time:  Tuesday May 01 2024 09:04:26 EST Ventricular Rate:  130 PR Interval:    QRS Duration:  88 QT Interval:  258 QTC Calculation: 379 R Axis:   47  Text Interpretation: Atrial fibrillation with rapid ventricular response Abnormal ECG When compared with ECG of 07-Mar-2024 09:49, Atrial fibrillation has replaced Sinus rhythm Confirmed by Terra Pac ([id number]) on 05/01/2024 9:28:47 AM     Echo 04/07/20 1. Left ventricular ejection fraction, by estimation, is 50 to 55%. The  left ventricle has low normal function. The left ventricle has no regional wall motion abnormalities. Left ventricular diastolic function could not be evaluated.   2. Right ventricular systolic function was not well visualized. The right ventricular size is normal. Tricuspid regurgitation signal is inadequate for assessing PA pressure.   3. The mitral valve is normal in structure. Trivial mitral valve  regurgitation. No evidence of mitral stenosis.   4. The aortic valve is calcified. There is mild calcification of the  aortic valve. There is moderate thickening of the aortic  valve. Aortic  valve regurgitation is not visualized. Mild to moderate aortic valve  sclerosis/calcification is present, without  any evidence of aortic stenosis.   5. The inferior vena cava is dilated in size with >50% respiratory  variability, suggesting right atrial pressure of 8 mmHg.    CHA2DS2-VASc Score = 4  The patient's score is based upon: CHF History: 0 HTN History: 1 Diabetes History: 1 Stroke History: 0 Vascular Disease History: 1 Age Score: 1 Gender Score: 0       ASSESSMENT AND PLAN: Persistent Atrial Fibrillation (ICD10:  I48.19) The patient's CHA2DS2-VASc score is 4, indicating a 4.8% annual risk of stroke.   S/p afib ablation 2016, 2021, 07/14/22 Dofetilide  discontinued post ablation.  S/p DCCV 01/06/24  Patient is currently in A-fib.  We discussed rhythm control options given this will be his third cardioversion this year.  After discussion, patient is very interested in speaking with [facility] regarding convergent procedure.  He declines a follow-up with Dr. Inocencio at this time.  In the interim, he agrees to begin temporary antiarrhythmic medication therapy to try to maintain sinus rhythm.  After discussion, we will start Multaq  400 mg twice daily 5 days prior to cardioversion.  He has held his own Invokana  for several days and he has held his Ozempic today.  Continue Coreg  25 mg twice daily.  Decrease lisinopril  to 10 mg daily.  Continue diltiazem  60 mg as needed. We discussed the procedure cardioversion to try to convert to NSR. We discussed the risks vs benefits of this procedure and how ultimately we cannot predict whether a patient will have early return of arrhythmia post procedure. After discussion, the patient wishes to proceed with cardioversion. Labs drawn today.   Informed Consent   Shared Decision Making/Informed Consent The risks (stroke, cardiac arrhythmias rarely resulting in the need for a temporary or permanent pacemaker, skin irritation or burns and  complications associated with conscious sedation including aspiration, arrhythmia, respiratory failure and death), benefits (restoration of normal sinus rhythm) and alternatives of a direct current cardioversion were explained in detail to Dr. Harles and he agrees to proceed.       Secondary Hypercoagulable State (  ICD10:  D68.69) The patient is at significant risk for stroke/thromboembolism based upon his CHA2DS2-VASc Score of 4.  Continue Apixaban  (Eliquis ).  No missed doses.  HTN Stable today.  CAD NSTEMI 2016 CAC score 1857 No anginal symptoms.    Follow up 2 weeks after DCCV.    Dennis Heinrich, PA-C [facility] [address] [place], [place] [address] [phone]

## 2024-05-02 ENCOUNTER — Ambulatory Visit (HOSPITAL_COMMUNITY): Payer: Self-pay | Admitting: Internal Medicine

## 2024-05-02 LAB — BASIC METABOLIC PANEL WITH GFR
BUN/Creatinine Ratio: 25 — ABNORMAL HIGH (ref 10–24)
BUN: 25 mg/dL (ref 8–27)
CO2: 24 mmol/L (ref 20–29)
Calcium: 9.8 mg/dL (ref 8.6–10.2)
Chloride: 102 mmol/L (ref 96–106)
Creatinine, Ser: 0.99 mg/dL (ref 0.76–1.27)
Glucose: 156 mg/dL — ABNORMAL HIGH (ref 70–99)
Potassium: 4.3 mmol/L (ref 3.5–5.2)
Sodium: 137 mmol/L (ref 134–144)
eGFR: 81 mL/min/1.73 (ref >59)

## 2024-05-02 LAB — CBC
Hematocrit: 42.9 % (ref 37.5–51.0)
Hemoglobin: 14.2 g/dL (ref 13.0–17.7)
MCH: 30.2 pg (ref 26.6–33.0)
MCHC: 33.1 g/dL (ref 31.5–35.7)
MCV: 91 fL (ref 79–97)
Platelets: 251 x10E3/uL (ref 150–450)
RBC: 4.7 x10E6/uL (ref 4.14–5.80)
RDW: 13.6 % (ref 11.6–15.4)
WBC: 8.7 x10E3/uL (ref 3.4–10.8)

## 2024-05-04 NOTE — Progress Notes (Signed)
 Pt called for pre procedure instructions.  Unable to leave voicemail. Arrival time 1145 NPO after midnight explained Instructed to take am meds with sip of water and confirmed blood thinner consistency Instructed pt need for ride home tomorrow and have responsible adult with them for 24 hrs post procedure.

## 2024-05-07 ENCOUNTER — Ambulatory Visit (HOSPITAL_COMMUNITY): Admitting: Anesthesiology

## 2024-05-07 ENCOUNTER — Encounter (HOSPITAL_COMMUNITY): Admission: RE | Disposition: A | Payer: Self-pay | Source: Home / Self Care | Attending: Cardiovascular Disease

## 2024-05-07 ENCOUNTER — Ambulatory Visit (HOSPITAL_COMMUNITY)
Admission: RE | Admit: 2024-05-07 | Discharge: 2024-05-07 | Disposition: A | Discharge status: Home / Self Care | Attending: Cardiovascular Disease | Admitting: Cardiovascular Disease

## 2024-05-07 ENCOUNTER — Other Ambulatory Visit: Payer: Self-pay

## 2024-05-07 DIAGNOSIS — D6869 Other thrombophilia: Secondary | ICD-10-CM | POA: Diagnosis not present

## 2024-05-07 DIAGNOSIS — Z79899 Other long term (current) drug therapy: Secondary | ICD-10-CM | POA: Insufficient documentation

## 2024-05-07 DIAGNOSIS — Z7984 Long term (current) use of oral hypoglycemic drugs: Secondary | ICD-10-CM | POA: Insufficient documentation

## 2024-05-07 DIAGNOSIS — I4891 Unspecified atrial fibrillation: Secondary | ICD-10-CM

## 2024-05-07 DIAGNOSIS — I4819 Other persistent atrial fibrillation: Secondary | ICD-10-CM | POA: Insufficient documentation

## 2024-05-07 DIAGNOSIS — Z7901 Long term (current) use of anticoagulants: Secondary | ICD-10-CM | POA: Diagnosis not present

## 2024-05-07 DIAGNOSIS — I2510 Atherosclerotic heart disease of native coronary artery without angina pectoris: Secondary | ICD-10-CM | POA: Diagnosis not present

## 2024-05-07 DIAGNOSIS — I252 Old myocardial infarction: Secondary | ICD-10-CM | POA: Insufficient documentation

## 2024-05-07 DIAGNOSIS — Z794 Long term (current) use of insulin: Secondary | ICD-10-CM | POA: Insufficient documentation

## 2024-05-07 DIAGNOSIS — Z7985 Long-term (current) use of injectable non-insulin antidiabetic drugs: Secondary | ICD-10-CM | POA: Insufficient documentation

## 2024-05-07 DIAGNOSIS — E785 Hyperlipidemia, unspecified: Secondary | ICD-10-CM | POA: Diagnosis not present

## 2024-05-07 DIAGNOSIS — I10 Essential (primary) hypertension: Secondary | ICD-10-CM | POA: Insufficient documentation

## 2024-05-07 HISTORY — PX: CARDIOVERSION: EP1203

## 2024-05-07 SURGERY — CARDIOVERSION (CATH LAB)
Anesthesia: General

## 2024-05-07 MED ORDER — LIDOCAINE HCL (CARDIAC) PF 100 MG/5ML IV SOSY
PREFILLED_SYRINGE | INTRAVENOUS | Status: DC | PRN
  Administered 2024-05-07: 14:00:00 60 mg via INTRATRACHEAL

## 2024-05-07 MED ORDER — PROPOFOL 10 MG/ML IV BOLUS
INTRAVENOUS | Status: DC | PRN
  Administered 2024-05-07: 14:00:00 50 mg via INTRAVENOUS

## 2024-05-07 MED ORDER — SODIUM CHLORIDE 0.9 % IV SOLN: INTRAVENOUS | Status: DC

## 2024-05-07 SURGICAL SUPPLY — 1 items: PAD DEFIB RADIO PHYSIO CONN (PAD) ×1 IMPLANT

## 2024-05-07 NOTE — Transfer of Care (Signed)
 Immediate Anesthesia Transfer of Care Note  Patient: Dennis King  Procedure(s) Performed: CARDIOVERSION  Patient Location: Cath Lab  Anesthesia Type:General  Level of Consciousness: awake, alert , and oriented  Airway & Oxygen Therapy: Patient Spontanous Breathing and Patient connected to nasal cannula oxygen  Post-op Assessment: Report given to RN and Post -op Vital signs reviewed and stable  Post vital signs: Reviewed and stable  Last Vitals:  Vitals Value Taken Time  BP 131/86 1336  Temp 36 1336  Pulse 85 1336  Resp 16 1336  SpO2 99 1336    Last Pain:  Vitals:   05/07/24 1246  TempSrc: Temporal         Complications: No notable events documented.

## 2024-05-07 NOTE — Anesthesia Preprocedure Evaluation (Addendum)
 Anesthesia Evaluation  Patient identified by MRN, date of birth, ID band Patient awake    Reviewed: Allergy & Precautions, NPO status , Patient's Chart, lab work & pertinent test results  History of Anesthesia Complications Negative for: history of anesthetic complications  Airway Mallampati: III  TM Distance: >3 FB Neck ROM: Full   Comment: Previous grade III view with MAC 4, easy mask Dental  (+) Dental Advisory Given   Pulmonary neg shortness of breath, sleep apnea , neg COPD, neg recent URI   Pulmonary exam normal breath sounds clear to auscultation       Cardiovascular hypertension (carvedilol , lisinopril ), Pt. on home beta blockers and Pt. on medications (-) angina + CAD (s/p ostial OM2 POBA) and + Past MI  (-) Cardiac Stents and (-) CABG + dysrhythmias Atrial Fibrillation  Rhythm:Irregular Rate:Normal  HLD  TTE 04/07/2020: IMPRESSIONS    1. Left ventricular ejection fraction, by estimation, is 50 to 55%. The  left ventricle has low normal function. The left ventricle has no regional  wall motion abnormalities. Left ventricular diastolic function could not  be evaluated.   2. Right ventricular systolic function was not well visualized. The right  ventricular size is normal. Tricuspid regurgitation signal is inadequate  for assessing PA pressure.   3. The mitral valve is normal in structure. Trivial mitral valve  regurgitation. No evidence of mitral stenosis.   4. The aortic valve is calcified. There is mild calcification of the  aortic valve. There is moderate thickening of the aortic valve. Aortic  valve regurgitation is not visualized. Mild to moderate aortic valve  sclerosis/calcification is present, without  any evidence of aortic stenosis.   5. The inferior vena cava is dilated in size with >50% respiratory  variability, suggesting right atrial pressure of 8 mmHg.   LHC 10/22/2014:  Ost 2nd Mrg to 2nd Mrg  lesion, 95% stenosed. Scoring balloon angioplasty was performed with excellent result and no visible dissection. There is a 10% residual stenosis post intervention.  Low normal LV systolic function.     Neuro/Psych negative neurological ROS     GI/Hepatic negative GI ROS, Neg liver ROS,,,  Endo/Other  diabetes, Type 2, Oral Hypoglycemic Agents    Renal/GU negative Renal ROS     Musculoskeletal  (+) Arthritis ,    Abdominal  (+) + obese  Peds  Hematology negative hematology ROS (+) Lab Results      Component                Value               Date                      WBC                      8.7                 05/01/2024                HGB                      14.2                05/01/2024                HCT  42.9                05/01/2024                MCV                      91                  05/01/2024                PLT                      251                 05/01/2024              Anesthesia Other Findings Last Ozempic: 2 weeks ago  Reproductive/Obstetrics                              Anesthesia Physical Anesthesia Plan  ASA: 3  Anesthesia Plan: General   Post-op Pain Management: Minimal or no pain anticipated   Induction: Intravenous  PONV Risk Score and Plan: 2 and Treatment may vary due to age or medical condition  Airway Management Planned: Natural Airway and Nasal Cannula  Additional Equipment:   Intra-op Plan:   Post-operative Plan:   Informed Consent: I have reviewed the patients History and Physical, chart, labs and discussed the procedure including the risks, benefits and alternatives for the proposed anesthesia with the patient or authorized representative who has indicated his/her understanding and acceptance.     Dental advisory given  Plan Discussed with: CRNA and Anesthesiologist  Anesthesia Plan Comments: (Risks of general anesthesia discussed including, but not limited to, sore  throat, hoarse voice, chipped/damaged teeth, injury to vocal cords, nausea and vomiting, allergic reactions, lung infection, heart attack, stroke, and death. All questions answered. )         Anesthesia Quick Evaluation

## 2024-05-07 NOTE — Anesthesia Postprocedure Evaluation (Signed)
 Anesthesia Post Note  Patient: Dennis King  Procedure(s) Performed: CARDIOVERSION     Patient location during evaluation: PACU Anesthesia Type: General Level of consciousness: awake Pain management: pain level controlled Vital Signs Assessment: post-procedure vital signs reviewed and stable Respiratory status: spontaneous breathing, nonlabored ventilation and respiratory function stable Cardiovascular status: blood pressure returned to baseline and stable Postop Assessment: no apparent nausea or vomiting Anesthetic complications: no   There were no known notable events for this encounter.  Last Vitals:  Vitals:   05/07/24 1400 05/07/24 1405  BP: 123/86 132/79  Pulse: 80 80  Resp: 18 14  Temp:    SpO2: 93% 95%    Last Pain:  Vitals:   05/07/24 1246  TempSrc: Temporal                 [name] Aisha Arch

## 2024-05-07 NOTE — Interval H&P Note (Signed)
 History and Physical Interval Note:  05/07/2024 1:12 PM  Dennis King  has presented today for surgery, with the diagnosis of AFIB.  The various methods of treatment have been discussed with the patient and family. After consideration of risks, benefits and other options for treatment, the patient has consented to  Procedures: CARDIOVERSION (N/A) as a surgical intervention.  The patient's history has been reviewed, patient examined, no change in status, stable for surgery.  I have reviewed the patient's chart and labs.  Questions were answered to the patient's satisfaction.     NPO for DCCV. On eliquis  >3 weeks. No missed doses.   Signed, Darryle DASEN. Barbaraann, MD, [facility]  [facility]  [address] [place], [place] [address] [phone]  1:12 PM

## 2024-05-07 NOTE — CV Procedure (Signed)
° °  DIRECT CURRENT CARDIOVERSION  NAME:  Dennis King    MRN: [id number] DOB:  [birth year]    ADMIT DATE: 05/07/2024  Indication:  Symptomatic atrial fibrillation   Procedure Note:  The patient signed informed consent.  They have had had therapeutic anticoagulation with eliquis  greater than 3 weeks.  Anesthesia was administered by Dr. Peggye.  Adequate airway was maintained throughout and vital followed per protocol.  They were cardioverted x 1 with 200J of biphasic synchronized energy.  They converted to NSR.  There were no apparent complications.  The patient had normal neuro status and respiratory status post procedure with vitals stable as recorded elsewhere.    Follow up: They will continue on current medical therapy and follow up with cardiology as scheduled.  Darryle T. Barbaraann, MD, [facility]  [facility]  [address] [place], [place] [address] [phone]  1:34 PM

## 2024-05-08 ENCOUNTER — Encounter (HOSPITAL_COMMUNITY): Payer: Self-pay | Admitting: Cardiovascular Disease

## 2024-05-21 ENCOUNTER — Ambulatory Visit (HOSPITAL_COMMUNITY)
Admission: RE | Admit: 2024-05-21 | Discharge: 2024-05-21 | Disposition: A | Discharge status: Home / Self Care | Source: Ambulatory Visit | Attending: Internal Medicine | Admitting: Internal Medicine

## 2024-05-21 VITALS — BP 162/80 | HR 73 | Ht 71.0 in | Wt 240.8 lb

## 2024-05-21 DIAGNOSIS — D6869 Other thrombophilia: Secondary | ICD-10-CM | POA: Diagnosis not present

## 2024-05-21 DIAGNOSIS — Z79899 Other long term (current) drug therapy: Secondary | ICD-10-CM

## 2024-05-21 DIAGNOSIS — I4819 Other persistent atrial fibrillation: Secondary | ICD-10-CM

## 2024-05-21 DIAGNOSIS — Z5181 Encounter for therapeutic drug level monitoring: Secondary | ICD-10-CM

## 2024-05-21 DIAGNOSIS — I4891 Unspecified atrial fibrillation: Secondary | ICD-10-CM | POA: Diagnosis not present

## 2024-05-21 NOTE — Patient Instructions (Signed)
 You may go to any [facility] Location for your lab work:1 month 06/21/24  [place] -[address] in our building  - [address] ([facility]) - [address] [phone] [address]    If you have any lab test that is abnormal or we need to change your treatment, we will call you

## 2024-05-21 NOTE — Progress Notes (Addendum)
 "  Primary Care Physician: Frederik Charleston, MD Referring Physician: Dr. Kelsie  Primary EP: Dr Inocencio Charleston Dennis King is a 73 y.o. male with a h/o persistent afib, HTN, OSA on CPAP, T2DM,  s/p ablation cardiomyopathy, CAD, that is in the afib clinic for admission for Tikosyn  per Dr. Kelsie. He stopped Multaq  and HCTZ on 9/28. He called the office yesterday as he was having some shortness of breath and fluid retention. He was given lasix /K+ yesterday and again this am and this helped with his symptoms.  He has not missed any anticoagulation for at least 3 weeks  and no benadryl use. He has cut back but continues with daily alcohol and caffeine use.   F/u in afib clinic, 03/25/21. He is one week out from tikosyn  load. He had been feeling so much better and was surprised that he was back in afib this am. He was placed on spironolactone  in the hospital and has lost 10 lbs. Is feeling much improved from  abdominal swelling and exertional shortness of breath.   F/u in afib clinic, 02/09/22 for Tikosyn  surveillance. He reports having afib about every 2 weeks lasing around 12 hours. He has had 2 ablations in the past with Dr. Kelsie. He is complaint with tikosyn . We discussed today establishing with another EP in Dr. Merrick absence to see if a 3 rd ablation would be necessary and he is interested to proceed.   Follow up in the AF clinic 06/15/22. Patient reports that he has an episode of palpitations and fatigue once every 1-2 weeks. They typically last 2-3 hours but can be up to 12 hours. He was started on lisinopril  HCTZ 3 days ago by his PCP. He is scheduled for an afib ablation 07/14/22.  Follow up in the AF clinic 08/26/22. Patient is now s/p ablation by Dr. Inocencio on 07/14/22. He is doing great overall. He is in SR today and has not had any episodes of Afib since ablation. No chest pain, shortness of breath, or trouble swallowing. Leg sites healed without issue. He has been compliant with  anticoagulation and no bleeding concerns. He is interested in coming off Tikosyn  and will discuss with Dr. Inocencio at 3 month f/u.   Follow up 08/02/23. Patient returns for follow up for atrial fibrillation. He reports that he went back into afib on 07/27/23. He had symptoms of tachypalpitations, fatigue, and noted low BP. He has been using PRN diltiazem  to slow his heart rate which has improved his symptoms. He denies any missed doses of anticoagulation.   Follow up 08/23/23. Patient returns for follow up for atrial fibrillation. He is s/p DCCV 08/09/23. He remains in SR today. He did have some bradycardia for a couple days post procedure with heart rate in the low 50s but was asymptomatic. No bleeding issues on anticoagulation.   Follow up 01/02/24. Patient returns for follow up for atrial fibrillation. He states that he went back into afib this past Wednesday. He was out of town on vacation at the time. He has symptoms of fatigue. No bleeding issues on anticoagulation.   Follow up 02/22/24. Patient returns for follow up for atrial fibrillation. He is s/p DCCV on 01/06/24. He remains in SR today and feels well, no interim symptoms of afib. No bleeding issues on anticoagulation.   Follow-up 05/01/2024.  Patient is currently in A-fib with RVR.  He notes that he has gone out of rhythm 3 times in the past couple of years all  coinciding with watching the [place]  bulldogs play football.  He has held his Invokana  since Saturday and held his Ozempic today.  He has been using diltiazem  60 mg as needed for heart rate greater than 100.  No missed doses of Eliquis .  Follow-up 05/21/2024.  Patient is currently in NSR.  S/p successful DCCV on 12/15.  He is taking Multaq  400 mg twice daily.  He is tolerating the medication without issue.  No missed doses of DOAC.  Today, he  denies symptoms of palpitations, chest pain, shortness of breath, orthopnea, PND, lower extremity edema, dizziness, presyncope, syncope, snoring,  daytime somnolence, bleeding, or neurologic sequela. The patient is tolerating medications without difficulties and is otherwise without complaint today.    Past Medical History:  Diagnosis Date   Arthritis    Coronary artery disease    past post circumflex obtuse marginal branch cutting balloon atherectomy by Dr. Dann   Family history of heart disease    Hyperlipidemia    Hypertension    Obesity    Obstructive sleep apnea    on C Pap   Paroxysmal A-fib (HCC)    Type 2 diabetes mellitus (HCC)     Current Outpatient Medications  Medication Sig Dispense Refill   apixaban  (ELIQUIS ) 5 MG TABS tablet TAKE ONE TABLET BY MOUTH TWICE DAILY 180 tablet 1   carvedilol  (COREG ) 25 MG tablet Take 12.5 mg by mouth 2 (two) times daily with a meal. (Patient taking differently: Take 25 mg by mouth 2 (two) times daily with a meal.)     cholecalciferol (VITAMIN D) 25 MCG (1000 UNIT) tablet Take 1,000 Units by mouth every evening.     Continuous Blood Gluc Receiver (FREESTYLE LIBRE 2 READER) DEVI Use as directed to monitor blood glucose     Continuous Blood Gluc Sensor (FREESTYLE LIBRE 2 SENSOR) MISC Use as directed every 14 days     diltiazem  (CARDIZEM ) 60 MG tablet TAKE ONE TABLET EVERY FOUR HOURS AS NEEDED FOR HEART RATE >100 45 tablet 1   dronedarone  (MULTAQ ) 400 MG tablet Take 1 tablet (400 mg total) by mouth 2 (two) times daily with a meal. 60 tablet 6   Evolocumab  (REPATHA  SURECLICK) 140 MG/ML SOAJ INJECT ONE PEN INTO THE SKIN EVERY 14 DAYS 6 mL 3   INVOKANA  300 MG TABS tablet Take 300 mg by mouth daily.  4   lisinopril  (ZESTRIL ) 20 MG tablet TAKE 1 TABLET BY MOUTH EVERY DAY 90 tablet 3   metFORMIN  (GLUCOPHAGE ) 1000 MG tablet Take 1,000 mg by mouth 2 (two) times daily with a meal.      Multiple Vitamin (MULTIVITAMIN WITH MINERALS) TABS tablet Take 1 tablet by mouth daily.     nitroGLYCERIN  (NITROSTAT ) 0.4 MG SL tablet Place 1 tablet (0.4 mg total) under the tongue every 5 (five) minutes as  needed for chest pain. 25 tablet 4   NON FORMULARY 1 Dose by Other route at bedtime. Cpap 8cm H2O     NOVOLOG  MIX 70/30 (70-30) 100 UNIT/ML injection Inject 10-20 Units into the skin 2 (two) times daily with a meal. If blood glucose is 100-130= 10 Take 20 units above 130 Sliding scale  4   Semaglutide (OZEMPIC, 2 MG/DOSE, Raoul) Inject 2 mg into the skin every Tuesday.     sildenafil (REVATIO) 20 MG tablet Take 20 mg by mouth daily as needed (ED). Taking 2-5 tablets by mouth as needed     No current facility-administered medications for this encounter.    ROS-  All systems are reviewed and negative except as per the HPI above  Physical Exam: Vitals:   05/21/24 0923  BP: (!) 162/80  Pulse: 73  Weight: 109.2 kg  Height: 5' 11 (1.803 m)     Wt Readings from Last 3 Encounters:  05/21/24 109.2 kg  05/07/24 109.8 kg  05/01/24 110.5 kg   GEN- The patient is well appearing, alert and oriented x 3 today.   Neck - no JVD or carotid bruit noted Lungs- Clear to ausculation bilaterally, normal work of breathing Heart- Regular rate and rhythm, no murmurs, rubs or gallops, PMI not laterally displaced Extremities- no clubbing, cyanosis, or edema Skin - no rash or ecchymosis noted    EKG today demonstrates EKG Interpretation Date/Time:  Monday May 21 2024 09:33:30 EST Ventricular Rate:  72 PR Interval:    QRS Duration:  88 QT Interval:  388 QTC Calculation: 424 R Axis:   27  Text Interpretation: Normal sinus rhythm When compared with ECG of 07-May-2024 13:41, PREVIOUS ECG IS PRESENT Confirmed by Terra Pac ([id number]) on 05/21/2024 9:36:06 AM     Echo 04/07/20 1. Left ventricular ejection fraction, by estimation, is 50 to 55%. The  left ventricle has low normal function. The left ventricle has no regional wall motion abnormalities. Left ventricular diastolic function could not be evaluated.   2. Right ventricular systolic function was not well visualized. The right ventricular  size is normal. Tricuspid regurgitation signal is inadequate for assessing PA pressure.   3. The mitral valve is normal in structure. Trivial mitral valve  regurgitation. No evidence of mitral stenosis.   4. The aortic valve is calcified. There is mild calcification of the  aortic valve. There is moderate thickening of the aortic valve. Aortic  valve regurgitation is not visualized. Mild to moderate aortic valve  sclerosis/calcification is present, without  any evidence of aortic stenosis.   5. The inferior vena cava is dilated in size with >50% respiratory  variability, suggesting right atrial pressure of 8 mmHg.    CHA2DS2-VASc Score = 4  The patient's score is based upon: CHF History: 0 HTN History: 1 Diabetes History: 1 Stroke History: 0 Vascular Disease History: 1 Age Score: 1 Gender Score: 0       ASSESSMENT AND PLAN: Persistent Atrial Fibrillation (ICD10:  I48.19) The patient's CHA2DS2-VASc score is 4, indicating a 4.8% annual risk of stroke.   S/p afib ablation 2016, 2021, 07/14/22 Dofetilide  discontinued post ablation.  S/p DCCV 01/06/24 S/p DCCV on 05/07/2024 (on Multaq )  Patient is currently in NSR.  Continue Coreg  25 mg twice daily. Patient previously declined follow up with Dr. Inocencio as he is interested in discussing mini-Maze procedure with [facility]. He notes that after 3 ablations and having had ERAF, he is interested in talking about a more comprehensive long term rhythm control strategy other than solely repeat ablation. Will help refer patient to [facility] to discuss if he is a candidate.  We will continue current medication regimen without changes at this time.  High risk medication monitoring (ICD10: [id number]) Patient requires ongoing monitoring for anti-arrhythmic medication which has the potential to cause life threatening arrhythmias or AV block. ECG intervals are stable. Continue Multaq  400 mg twice daily. CMET order given to draw in 1 month.   Secondary  Hypercoagulable State (ICD10:  D68.69) The patient is at significant risk for stroke/thromboembolism based upon his CHA2DS2-VASc Score of 4.  Continue Apixaban  (Eliquis ).  No missed doses.  HTN Elevated today, noted since increasing  Coreg  to 25 mg BID his systolic reading at home is much improved. Continue to trend BP at home.  CAD NSTEMI 2016 CAC score 1857 No anginal symptoms.    Follow up with Afib clinic as scheduled. Will refer to [facility] per patient request.     Dorn Heinrich, PA-C [facility] [address] [place], [place] [address] [phone]  "

## 2024-05-29 ENCOUNTER — Other Ambulatory Visit: Payer: Self-pay | Admitting: Cardiology

## 2024-05-29 DIAGNOSIS — E7800 Pure hypercholesterolemia, unspecified: Secondary | ICD-10-CM

## 2024-05-29 NOTE — Telephone Encounter (Signed)
" °*  STAT* If patient is at the pharmacy, call can be transferred to refill team.   1. Which medications need to be refilled? (please list name of each medication and dose if known) Evolocumab  (REPATHA  SURECLICK) 140 MG/ML SOAJ    2. Would you like to learn more about the convenience, safety, & potential cost savings by using the [facility]?     3. Are you open to using the [facility] (Type [facility]..   4. Which pharmacy/location (including street and city if local pharmacy) is medication to be sent to? [id number] - [place], [place] - [address] [place]     5. Do they need a 30 day or 90 day supply? 90  PT is out of meds  "

## 2024-10-03 ENCOUNTER — Ambulatory Visit (HOSPITAL_COMMUNITY): Admitting: Physician Assistant
